# Patient Record
Sex: Male | Born: 1980 | Race: Black or African American | Hispanic: No | Marital: Single | Smoking: Never smoker
Health system: Southern US, Community
[De-identification: ages and names within clinical notes are randomized; demographics above are authoritative.]

## PROBLEM LIST (undated history)

## (undated) ENCOUNTER — Ambulatory Visit: Admission: EM | Payer: Self-pay | Source: Home / Self Care

## (undated) DIAGNOSIS — I10 Essential (primary) hypertension: Secondary | ICD-10-CM

## (undated) DIAGNOSIS — E119 Type 2 diabetes mellitus without complications: Secondary | ICD-10-CM

## (undated) HISTORY — PX: SHOULDER SURGERY: SHX246

---

## 2001-01-30 ENCOUNTER — Encounter: Payer: Self-pay | Admitting: *Deleted

## 2001-01-30 ENCOUNTER — Emergency Department (HOSPITAL_COMMUNITY): Admission: EM | Admit: 2001-01-30 | Discharge: 2001-01-30 | Payer: Self-pay | Admitting: *Deleted

## 2001-02-05 ENCOUNTER — Encounter: Admission: RE | Admit: 2001-02-05 | Discharge: 2001-05-06 | Payer: Self-pay | Admitting: Family Medicine

## 2001-11-28 ENCOUNTER — Emergency Department (HOSPITAL_COMMUNITY): Admission: EM | Admit: 2001-11-28 | Discharge: 2001-11-28 | Payer: Self-pay | Admitting: *Deleted

## 2001-11-29 ENCOUNTER — Emergency Department (HOSPITAL_COMMUNITY): Admission: EM | Admit: 2001-11-29 | Discharge: 2001-11-29 | Payer: Self-pay | Admitting: Emergency Medicine

## 2003-08-24 ENCOUNTER — Emergency Department (HOSPITAL_COMMUNITY): Admission: EM | Admit: 2003-08-24 | Discharge: 2003-08-25 | Payer: Self-pay | Admitting: Emergency Medicine

## 2004-12-28 ENCOUNTER — Emergency Department (HOSPITAL_COMMUNITY): Admission: EM | Admit: 2004-12-28 | Discharge: 2004-12-28 | Payer: Self-pay | Admitting: Emergency Medicine

## 2005-09-16 ENCOUNTER — Emergency Department (HOSPITAL_COMMUNITY): Admission: EM | Admit: 2005-09-16 | Discharge: 2005-09-16 | Payer: Self-pay | Admitting: Emergency Medicine

## 2008-07-12 ENCOUNTER — Emergency Department (HOSPITAL_COMMUNITY): Admission: EM | Admit: 2008-07-12 | Discharge: 2008-07-12 | Payer: Self-pay | Admitting: Emergency Medicine

## 2009-11-15 ENCOUNTER — Emergency Department (HOSPITAL_COMMUNITY): Admission: EM | Admit: 2009-11-15 | Discharge: 2009-11-15 | Payer: Self-pay | Admitting: Emergency Medicine

## 2010-06-07 LAB — GC/CHLAMYDIA PROBE AMP, GENITAL
Chlamydia, DNA Probe: NEGATIVE
GC Probe Amp, Genital: NEGATIVE

## 2015-12-22 ENCOUNTER — Emergency Department (HOSPITAL_COMMUNITY)
Admission: EM | Admit: 2015-12-22 | Discharge: 2015-12-23 | Disposition: A | Discharge status: Home / Self Care | Payer: No Typology Code available for payment source | Attending: Emergency Medicine | Admitting: Emergency Medicine

## 2015-12-22 ENCOUNTER — Encounter (HOSPITAL_COMMUNITY): Payer: Self-pay | Admitting: *Deleted

## 2015-12-22 DIAGNOSIS — R0781 Pleurodynia: Secondary | ICD-10-CM | POA: Diagnosis not present

## 2015-12-22 DIAGNOSIS — Y999 Unspecified external cause status: Secondary | ICD-10-CM | POA: Diagnosis not present

## 2015-12-22 DIAGNOSIS — M25562 Pain in left knee: Secondary | ICD-10-CM | POA: Insufficient documentation

## 2015-12-22 DIAGNOSIS — Y9389 Activity, other specified: Secondary | ICD-10-CM | POA: Insufficient documentation

## 2015-12-22 DIAGNOSIS — Y92410 Unspecified street and highway as the place of occurrence of the external cause: Secondary | ICD-10-CM | POA: Insufficient documentation

## 2015-12-22 MED ORDER — IBUPROFEN 800 MG PO TABS
800.0000 mg | ORAL_TABLET | Freq: Once | ORAL | Status: AC
  Administered 2015-12-23: 800 mg via ORAL
  Filled 2015-12-22: qty 1
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
## 2015-12-22 NOTE — Discharge Instructions (Signed)
Return if any problems.

## 2015-12-24 ENCOUNTER — Emergency Department (HOSPITAL_COMMUNITY)
Admission: EM | Admit: 2015-12-24 | Discharge: 2015-12-24 | Disposition: A | Discharge status: Home / Self Care | Payer: No Typology Code available for payment source | Attending: Emergency Medicine | Admitting: Emergency Medicine

## 2015-12-24 ENCOUNTER — Encounter (HOSPITAL_COMMUNITY): Payer: Self-pay

## 2015-12-24 DIAGNOSIS — R739 Hyperglycemia, unspecified: Secondary | ICD-10-CM | POA: Diagnosis not present

## 2015-12-24 DIAGNOSIS — S3992XD Unspecified injury of lower back, subsequent encounter: Secondary | ICD-10-CM | POA: Diagnosis present

## 2015-12-24 DIAGNOSIS — S39012D Strain of muscle, fascia and tendon of lower back, subsequent encounter: Secondary | ICD-10-CM | POA: Insufficient documentation

## 2015-12-24 HISTORY — DX: Type 2 diabetes mellitus without complications: E11.9

## 2015-12-24 LAB — URINE MICROSCOPIC-ADD ON
Bacteria, UA: NONE SEEN
SQUAMOUS EPITHELIAL / LPF: NONE SEEN
WBC, UA: NONE SEEN WBC/hpf (ref 0–5)

## 2015-12-24 LAB — CBC WITH DIFFERENTIAL/PLATELET
Basophils Absolute: 0 10*3/uL (ref 0.0–0.1)
Basophils Relative: 0 %
EOS PCT: 1 %
Eosinophils Absolute: 0.1 10*3/uL (ref 0.0–0.7)
HCT: 44.5 % (ref 39.0–52.0)
Hemoglobin: 14.9 g/dL (ref 13.0–17.0)
LYMPHS ABS: 1.7 10*3/uL (ref 0.7–4.0)
LYMPHS PCT: 30 %
MCH: 26.8 pg (ref 26.0–34.0)
MCHC: 33.5 g/dL (ref 30.0–36.0)
MCV: 79.9 fL (ref 78.0–100.0)
Monocytes Absolute: 0.3 10*3/uL (ref 0.1–1.0)
Monocytes Relative: 5 %
Neutro Abs: 3.5 10*3/uL (ref 1.7–7.7)
Neutrophils Relative %: 64 %
PLATELETS: 168 10*3/uL (ref 150–400)
RBC: 5.57 MIL/uL (ref 4.22–5.81)
RDW: 12.4 % (ref 11.5–15.5)
WBC: 5.6 10*3/uL (ref 4.0–10.5)

## 2015-12-24 LAB — BASIC METABOLIC PANEL
Anion gap: 5 (ref 5–15)
BUN: 17 mg/dL (ref 6–20)
CHLORIDE: 99 mmol/L — AB (ref 101–111)
CO2: 30 mmol/L (ref 22–32)
Calcium: 9.2 mg/dL (ref 8.9–10.3)
Creatinine, Ser: 0.8 mg/dL (ref 0.61–1.24)
GFR calc Af Amer: >60 mL/min (ref >60)
GLUCOSE: 293 mg/dL — AB (ref 65–99)
POTASSIUM: 4.4 mmol/L (ref 3.5–5.1)
Sodium: 134 mmol/L — ABNORMAL LOW (ref 135–145)

## 2015-12-24 LAB — URINALYSIS, ROUTINE W REFLEX MICROSCOPIC
Bilirubin Urine: NEGATIVE
Glucose, UA: >1000 mg/dL — AB
Ketones, ur: NEGATIVE mg/dL
Leukocytes, UA: NEGATIVE
Nitrite: NEGATIVE
Protein, ur: NEGATIVE mg/dL
Specific Gravity, Urine: 1.01 (ref 1.005–1.030)
pH: 6 (ref 5.0–8.0)

## 2015-12-24 MED ORDER — METFORMIN HCL 500 MG PO TABS: 500.0000 mg | ORAL_TABLET | Freq: Two times a day (BID) | ORAL | 0 refills | Status: DC

## 2015-12-24 MED ORDER — CYCLOBENZAPRINE HCL 10 MG PO TABS
10.0000 mg | ORAL_TABLET | Freq: Once | ORAL | Status: AC
  Administered 2015-12-24: 10 mg via ORAL
  Filled 2015-12-24: qty 1

## 2015-12-24 MED ORDER — IBUPROFEN 800 MG PO TABS
800.0000 mg | ORAL_TABLET | Freq: Once | ORAL | Status: AC
  Administered 2015-12-24: 800 mg via ORAL
  Filled 2015-12-24: qty 1

## 2015-12-24 MED ORDER — CYCLOBENZAPRINE HCL 10 MG PO TABS: 10.0000 mg | ORAL_TABLET | Freq: Three times a day (TID) | ORAL | 0 refills | Status: DC | PRN

## 2015-12-24 MED ORDER — IBUPROFEN 800 MG PO TABS: 800.0000 mg | ORAL_TABLET | Freq: Three times a day (TID) | ORAL | 0 refills | Status: DC
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
## 2015-12-24 NOTE — Discharge Instructions (Signed)
Apply ice packs on and off to the affected area. It's important that you control your blood sugars and monitor them closely. Call one of the clinics listed above to arrange a follow-up appointment and establish primary care

## 2016-01-07 ENCOUNTER — Encounter (HOSPITAL_COMMUNITY): Payer: Self-pay | Admitting: Emergency Medicine

## 2016-01-07 ENCOUNTER — Emergency Department (HOSPITAL_COMMUNITY): Payer: No Typology Code available for payment source

## 2016-01-07 ENCOUNTER — Emergency Department (HOSPITAL_COMMUNITY)
Admission: EM | Admit: 2016-01-07 | Discharge: 2016-01-07 | Disposition: A | Discharge status: Home / Self Care | Payer: No Typology Code available for payment source | Attending: Emergency Medicine | Admitting: Emergency Medicine

## 2016-01-07 DIAGNOSIS — Z7984 Long term (current) use of oral hypoglycemic drugs: Secondary | ICD-10-CM | POA: Insufficient documentation

## 2016-01-07 DIAGNOSIS — R739 Hyperglycemia, unspecified: Secondary | ICD-10-CM

## 2016-01-07 DIAGNOSIS — Z791 Long term (current) use of non-steroidal anti-inflammatories (NSAID): Secondary | ICD-10-CM | POA: Diagnosis not present

## 2016-01-07 DIAGNOSIS — Y92410 Unspecified street and highway as the place of occurrence of the external cause: Secondary | ICD-10-CM | POA: Insufficient documentation

## 2016-01-07 DIAGNOSIS — E1165 Type 2 diabetes mellitus with hyperglycemia: Secondary | ICD-10-CM | POA: Diagnosis not present

## 2016-01-07 DIAGNOSIS — Y999 Unspecified external cause status: Secondary | ICD-10-CM | POA: Insufficient documentation

## 2016-01-07 DIAGNOSIS — S39012A Strain of muscle, fascia and tendon of lower back, initial encounter: Secondary | ICD-10-CM | POA: Diagnosis not present

## 2016-01-07 DIAGNOSIS — S3992XA Unspecified injury of lower back, initial encounter: Secondary | ICD-10-CM | POA: Diagnosis present

## 2016-01-07 DIAGNOSIS — I10 Essential (primary) hypertension: Secondary | ICD-10-CM | POA: Insufficient documentation

## 2016-01-07 DIAGNOSIS — Y939 Activity, unspecified: Secondary | ICD-10-CM | POA: Diagnosis not present

## 2016-01-07 HISTORY — DX: Essential (primary) hypertension: I10

## 2016-01-07 LAB — BASIC METABOLIC PANEL
Anion gap: 5 (ref 5–15)
BUN: 11 mg/dL (ref 6–20)
CHLORIDE: 97 mmol/L — AB (ref 101–111)
CO2: 30 mmol/L (ref 22–32)
Calcium: 9 mg/dL (ref 8.9–10.3)
Creatinine, Ser: 0.82 mg/dL (ref 0.61–1.24)
GFR calc Af Amer: >60 mL/min (ref >60)
GFR calc non Af Amer: >60 mL/min (ref >60)
GLUCOSE: 279 mg/dL — AB (ref 65–99)
POTASSIUM: 4.2 mmol/L (ref 3.5–5.1)
Sodium: 132 mmol/L — ABNORMAL LOW (ref 135–145)

## 2016-01-07 LAB — CBG MONITORING, ED: Glucose-Capillary: 283 mg/dL — ABNORMAL HIGH (ref 65–99)

## 2016-01-07 MED ORDER — HYDROCODONE-ACETAMINOPHEN 5-325 MG PO TABS: 1.0000 | ORAL_TABLET | ORAL | 0 refills | Status: DC | PRN

## 2016-01-07 MED ORDER — MELOXICAM 15 MG PO TABS: 15.0000 mg | ORAL_TABLET | Freq: Every day | ORAL | 0 refills | Status: DC
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
## 2016-01-07 NOTE — Discharge Instructions (Signed)
Make sure you're taking the metformin you were prescribed at your last visit twice daily.  Take the medications prescribed today, using caution with hydrocodone as this medication will make you sleepy, do not drive within 4 hours of taking this medicine.  I do encourage you to get the Flexeril which is a muscle relaxer filled which were prescribed at her last visit here also.
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
## 2016-04-01 ENCOUNTER — Emergency Department (HOSPITAL_COMMUNITY)
Admission: EM | Admit: 2016-04-01 | Discharge: 2016-04-01 | Disposition: A | Discharge status: Home / Self Care | Payer: Self-pay | Attending: Emergency Medicine | Admitting: Emergency Medicine

## 2016-04-01 ENCOUNTER — Encounter (HOSPITAL_COMMUNITY): Payer: Self-pay | Admitting: Emergency Medicine

## 2016-04-01 DIAGNOSIS — Z791 Long term (current) use of non-steroidal anti-inflammatories (NSAID): Secondary | ICD-10-CM | POA: Insufficient documentation

## 2016-04-01 DIAGNOSIS — I10 Essential (primary) hypertension: Secondary | ICD-10-CM | POA: Insufficient documentation

## 2016-04-01 DIAGNOSIS — R112 Nausea with vomiting, unspecified: Secondary | ICD-10-CM | POA: Insufficient documentation

## 2016-04-01 DIAGNOSIS — E119 Type 2 diabetes mellitus without complications: Secondary | ICD-10-CM | POA: Insufficient documentation

## 2016-04-01 LAB — CBC WITH DIFFERENTIAL/PLATELET
Basophils Absolute: 0 10*3/uL (ref 0.0–0.1)
Basophils Relative: 0 %
Eosinophils Absolute: 0.1 10*3/uL (ref 0.0–0.7)
Eosinophils Relative: 1 %
HEMATOCRIT: 40.9 % (ref 39.0–52.0)
HEMOGLOBIN: 13.8 g/dL (ref 13.0–17.0)
LYMPHS PCT: 26 %
Lymphs Abs: 1.1 10*3/uL (ref 0.7–4.0)
MCH: 26.5 pg (ref 26.0–34.0)
MCHC: 33.7 g/dL (ref 30.0–36.0)
MCV: 78.7 fL (ref 78.0–100.0)
MONO ABS: 0.2 10*3/uL (ref 0.1–1.0)
MONOS PCT: 6 %
NEUTROS ABS: 2.8 10*3/uL (ref 1.7–7.7)
NEUTROS PCT: 67 %
Platelets: 50 10*3/uL — ABNORMAL LOW (ref 150–400)
RBC: 5.2 MIL/uL (ref 4.22–5.81)
RDW: 12.4 % (ref 11.5–15.5)
WBC: 4.2 10*3/uL (ref 4.0–10.5)

## 2016-04-01 LAB — COMPREHENSIVE METABOLIC PANEL
ALBUMIN: 4 g/dL (ref 3.5–5.0)
ALT: 31 U/L (ref 17–63)
ANION GAP: 5 (ref 5–15)
AST: 41 U/L (ref 15–41)
Alkaline Phosphatase: 69 U/L (ref 38–126)
BUN: 13 mg/dL (ref 6–20)
CO2: 27 mmol/L (ref 22–32)
Calcium: 9.2 mg/dL (ref 8.9–10.3)
Chloride: 101 mmol/L (ref 101–111)
Creatinine, Ser: 0.88 mg/dL (ref 0.61–1.24)
GFR calc non Af Amer: >60 mL/min (ref >60)
GLUCOSE: 310 mg/dL — AB (ref 65–99)
POTASSIUM: 5.4 mmol/L — AB (ref 3.5–5.1)
SODIUM: 133 mmol/L — AB (ref 135–145)
TOTAL PROTEIN: 7.4 g/dL (ref 6.5–8.1)
Total Bilirubin: 1.1 mg/dL (ref 0.3–1.2)

## 2016-04-01 MED ORDER — PROMETHAZINE HCL 25 MG PO TABS: 25.0000 mg | ORAL_TABLET | Freq: Four times a day (QID) | ORAL | 0 refills | Status: DC | PRN

## 2016-04-01 MED ORDER — METFORMIN HCL 500 MG PO TABS: 500.0000 mg | ORAL_TABLET | Freq: Two times a day (BID) | ORAL | 1 refills | Status: DC

## 2016-04-01 NOTE — Discharge Instructions (Signed)
Increase fluids. Refill of your diabetes medicine. Prescription for nausea medicine. You need to find a primary care doctor.

## 2016-04-01 NOTE — ED Provider Notes (Signed)
[facility] Provider Note   CSN: [id number] Arrival date & time: 04/01/16  1038  By signing my name below, I, Majel Homer, attest that this documentation has been prepared under the direction and in the presence of Donnetta Hutching, MD . Electronically Signed: Majel Homer, Scribe. 04/01/2016. 11:24 AM.  History   Chief Complaint Chief Complaint  Patient presents with  . Emesis   The history is provided by the patient. No language interpreter was used.   HPI Comments: Mike Bowen is a 36 y.o. male with PMHx of DM and HTN, who presents to the Emergency Department for an evaluation of multiple episodes of nausea and vomiting that began at ~3:00 AM yesterday morning. Pt reports his symptoms are gradually improving as he was able to tolerate food and drink liquids without any vomiting this morning. He notes he has not checked his sugar today but states it "has been running high." Pt reports he recently ran out of his metformin and is "waiting for his new insurance to kick in from his new job" before refilling his prescription. He denies any abdominal pain.   Past Medical History:  Diagnosis Date  . Diabetes mellitus without complication (HCC)   . Hypertension    There are no active problems to display for this patient.  Past Surgical History:  Procedure Laterality Date  . SHOULDER SURGERY      Home Medications    Prior to Admission medications   Medication Sig Start Date End Date Taking? Authorizing Provider  ibuprofen (ADVIL,MOTRIN) 800 MG tablet Take 1 tablet (800 mg total) by mouth 3 (three) times daily. 12/24/15  Yes Tammy Triplett, PA-C  cyclobenzaprine (FLEXERIL) 10 MG tablet Take 1 tablet (10 mg total) by mouth 3 (three) times daily as needed. Patient not taking: Reported on 04/01/2016 12/24/15   Tammy Triplett, PA-C  HYDROcodone-acetaminophen (NORCO/VICODIN) 5-325 MG tablet Take 1 tablet by mouth every 4 (four) hours as needed. Patient not taking: Reported on 04/01/2016 01/07/16    Burgess Amor, PA-C  metFORMIN (GLUCOPHAGE) 500 MG tablet Take 1 tablet (500 mg total) by mouth 2 (two) times daily with a meal. 04/01/16   Donnetta Hutching, MD  promethazine (PHENERGAN) 25 MG tablet Take 1 tablet (25 mg total) by mouth every 6 (six) hours as needed. 04/01/16   Donnetta Hutching, MD    Family History Family History  Problem Relation Age of Onset  . Diabetes Other     Social History Social History  Substance Use Topics  . Smoking status: Never Smoker  . Smokeless tobacco: Never Used  . Alcohol use No   Allergies   Patient has no known allergies.  Review of Systems Review of Systems  Constitutional: Negative for fever.  Gastrointestinal: Positive for nausea and vomiting. Negative for abdominal pain.  All other systems reviewed and are negative.  Physical Exam Updated Vital Signs BP (!) 164/116 (BP Location: Right Arm)   Pulse 83   Temp 97.9 F (36.6 C) (Oral)   Resp 18   Ht 6' (1.829 m)   Wt 225 lb (102.1 kg)   SpO2 100%   BMI 30.52 kg/m   Physical Exam  Constitutional: He is oriented to person, place, and time. He appears well-developed and well-nourished.  HENT:  Head: Normocephalic and atraumatic.  Eyes: Conjunctivae are normal.  Neck: Neck supple.  Cardiovascular: Normal rate and regular rhythm.   Pulmonary/Chest: Effort normal and breath sounds normal.  Abdominal: Soft. Bowel sounds are normal.  Musculoskeletal: Normal range of  motion.  Neurological: He is alert and oriented to person, place, and time.  Skin: Skin is warm and dry.  Psychiatric: He has a normal mood and affect. His behavior is normal.  Nursing note and vitals reviewed.  ED Treatments / Results  DIAGNOSTIC STUDIES:  Oxygen Saturation is 100% on RA, normal by my interpretation.    COORDINATION OF CARE:  11:15 AM Discussed treatment plan with pt at bedside and pt agreed to plan. Plan to check glucose levels and prescribe pt metformin.   Labs (all labs ordered are listed, but only  abnormal results are displayed) Labs Reviewed  COMPREHENSIVE METABOLIC PANEL - Abnormal; Notable for the following:       Result Value   Sodium 133 (*)    Potassium 5.4 (*)    Glucose, Bld 310 (*)    All other components within normal limits  CBC WITH DIFFERENTIAL/PLATELET - Abnormal; Notable for the following:    Platelets 50 (*)    All other components within normal limits    EKG  EKG Interpretation None       Radiology No results found.  Procedures Procedures (including critical care time)  Medications Ordered in ED Medications - No data to display  Initial Impression / Assessment and Plan / ED Course  I have reviewed the triage vital signs and the nursing notes.  Pertinent labs & imaging results that were available during my care of the patient were reviewed by me and considered in my medical decision making (see chart for details).    patient is nontoxic-appearing.  Glucose is 310, but patient is not in DKA. He has not been taking his diabetes medicine. Discharge medications metformin 500 mg and Phenergan 25 mg  I personally performed the services described in this documentation, which was scribed in my presence. The recorded information has been reviewed and is accurate.    Final Clinical Impressions(s) / ED Diagnoses   Final diagnoses:  Intractable vomiting with nausea, unspecified vomiting type    New Prescriptions New Prescriptions   METFORMIN (GLUCOPHAGE) 500 MG TABLET    Take 1 tablet (500 mg total) by mouth 2 (two) times daily with a meal.   PROMETHAZINE (PHENERGAN) 25 MG TABLET    Take 1 tablet (25 mg total) by mouth every 6 (six) hours as needed.     Donnetta HutchingBrian Roselle Norton, MD 04/01/16 [phone]

## 2016-04-01 NOTE — ED Triage Notes (Signed)
Pt reports n/v/d x2 days, denies abd pain.  Pt alert and oriented, pt daughter recently had stomach virus.

## 2016-06-08 ENCOUNTER — Emergency Department (HOSPITAL_COMMUNITY)
Admission: EM | Admit: 2016-06-08 | Discharge: 2016-06-08 | Disposition: A | Discharge status: Home / Self Care | Payer: Self-pay | Attending: Emergency Medicine | Admitting: Emergency Medicine

## 2016-06-08 ENCOUNTER — Encounter (HOSPITAL_COMMUNITY): Payer: Self-pay

## 2016-06-08 DIAGNOSIS — Z79899 Other long term (current) drug therapy: Secondary | ICD-10-CM | POA: Insufficient documentation

## 2016-06-08 DIAGNOSIS — E119 Type 2 diabetes mellitus without complications: Secondary | ICD-10-CM | POA: Insufficient documentation

## 2016-06-08 DIAGNOSIS — M25642 Stiffness of left hand, not elsewhere classified: Secondary | ICD-10-CM | POA: Insufficient documentation

## 2016-06-08 DIAGNOSIS — M25641 Stiffness of right hand, not elsewhere classified: Secondary | ICD-10-CM | POA: Insufficient documentation

## 2016-06-08 DIAGNOSIS — I10 Essential (primary) hypertension: Secondary | ICD-10-CM | POA: Insufficient documentation

## 2016-06-08 DIAGNOSIS — R51 Headache: Secondary | ICD-10-CM | POA: Insufficient documentation

## 2016-06-08 DIAGNOSIS — Z7984 Long term (current) use of oral hypoglycemic drugs: Secondary | ICD-10-CM | POA: Insufficient documentation

## 2016-06-08 NOTE — ED Provider Notes (Signed)
[facility] DEPT Provider Note   CSN: [id number] Arrival date & time: 06/08/16  0428     History   Chief Complaint Chief Complaint  Patient presents with  . Hand Pain    HPI Mike Bowen is a 36 y.o. male.  HPI Patient presents for work note. States he developed a migraine headache around midnight and had to leave work. He went home and took a nap. When he woke up the headache was gone. No weakness or numbness. No visual changes.  He also states he had some difficulty fully flexing the middle fingers of both hands when he woke up. Denies numbness or injury. Denies any pain. No swelling or redness. Past Medical History:  Diagnosis Date  . Diabetes mellitus without complication (HCC)   . Hypertension     There are no active problems to display for this patient.   Past Surgical History:  Procedure Laterality Date  . SHOULDER SURGERY         Home Medications    Prior to Admission medications   Medication Sig Start Date End Date Taking? Authorizing Provider  metFORMIN (GLUCOPHAGE) 500 MG tablet Take 1 tablet (500 mg total) by mouth 2 (two) times daily with a meal. 04/01/16  Yes Donnetta Hutching, MD  cyclobenzaprine (FLEXERIL) 10 MG tablet Take 1 tablet (10 mg total) by mouth 3 (three) times daily as needed. Patient not taking: Reported on 04/01/2016 12/24/15   Tammy Triplett, PA-C  HYDROcodone-acetaminophen (NORCO/VICODIN) 5-325 MG tablet Take 1 tablet by mouth every 4 (four) hours as needed. Patient not taking: Reported on 04/01/2016 01/07/16   Burgess Amor, PA-C  ibuprofen (ADVIL,MOTRIN) 800 MG tablet Take 1 tablet (800 mg total) by mouth 3 (three) times daily. 12/24/15   Tammy Triplett, PA-C  promethazine (PHENERGAN) 25 MG tablet Take 1 tablet (25 mg total) by mouth every 6 (six) hours as needed. 04/01/16   Donnetta Hutching, MD    Family History Family History  Problem Relation Age of Onset  . Diabetes Other     Social History Social History  Substance Use Topics  . Smoking  status: Never Smoker  . Smokeless tobacco: Never Used  . Alcohol use No     Allergies   Patient has no known allergies.   Review of Systems Review of Systems  Constitutional: Negative for chills and fever.  Eyes: Negative for photophobia and visual disturbance.  Gastrointestinal: Negative for nausea and vomiting.  Musculoskeletal: Negative for arthralgias and myalgias.  Skin: Negative for rash and wound.  Neurological: Positive for headaches. Negative for weakness and numbness.  All other systems reviewed and are negative.    Physical Exam Updated Vital Signs BP (!) 159/102   Pulse 70   Temp 97.7 F (36.5 C)   Resp 18   Ht 6' (1.829 m)   Wt 225 lb (102.1 kg)   SpO2 99%   BMI 30.52 kg/m   Physical Exam  Constitutional: He is oriented to person, place, and time. He appears well-developed and well-nourished.  HENT:  Head: Normocephalic and atraumatic.  Mouth/Throat: Oropharynx is clear and moist.  Eyes: EOM are normal. Pupils are equal, round, and reactive to light.  Neck: Normal range of motion. Neck supple.  Cardiovascular: Normal rate and regular rhythm.   Pulmonary/Chest: Effort normal and breath sounds normal.  Abdominal: Soft. Bowel sounds are normal.  Musculoskeletal: Normal range of motion. He exhibits no edema or tenderness.  Patient will not fully flex the right middle finger on command. No  palpated masses in the flexor tendon. No swelling, erythema or evidence of injury. Patient is witnessed fully flexing the same finger when not specifically asked to. Patient has full range of motion of the middle finger of the left hand. No flexor tendon masses appreciated. No obvious injury, redness, swelling or warmth demonstrated. Good distal cap refill.  Neurological: He is alert and oriented to person, place, and time.  5/5 motor in all extremities. Sensation fully intact. Ambulating without difficulty  Skin: Skin is warm and dry. Capillary refill takes less than 2  seconds. No rash noted. No erythema.  Psychiatric: He has a normal mood and affect. His behavior is normal.  Nursing note and vitals reviewed.    ED Treatments / Results  Labs (all labs ordered are listed, but only abnormal results are displayed) Labs Reviewed - No data to display  EKG  EKG Interpretation None       Radiology No results found.  Procedures Procedures (including critical care time)  Medications Ordered in ED Medications - No data to display   Initial Impression / Assessment and Plan / ED Course  I have reviewed the triage vital signs and the nursing notes.  Pertinent labs & imaging results that were available during my care of the patient were reviewed by me and considered in my medical decision making (see chart for details).    No headache and a normal neurologic exam. Low suspicion for acute injury or dysfunction of the patient's hands. Patient has inconsistent exam. Advised to follow-up with hand surgery if symptoms persist.  Final Clinical Impressions(s) / ED Diagnoses   Final diagnoses:  Finger stiffness, left  Finger stiffness, right    New Prescriptions New Prescriptions   No medications on file     Loren Racer, MD 06/08/16 0501

## 2016-06-08 NOTE — ED Triage Notes (Signed)
Pt initially states he is here because he had to leave work last night for a migraine.  Pt states he does not have a headache at all now, but needs a work note.  Pt states he is also having pain to the middle finger of his right hand, and denies injury

## 2016-06-24 ENCOUNTER — Emergency Department (HOSPITAL_COMMUNITY): Payer: Self-pay

## 2016-06-24 ENCOUNTER — Encounter (HOSPITAL_COMMUNITY): Payer: Self-pay

## 2016-06-24 DIAGNOSIS — E1165 Type 2 diabetes mellitus with hyperglycemia: Secondary | ICD-10-CM | POA: Insufficient documentation

## 2016-06-24 DIAGNOSIS — Z5321 Procedure and treatment not carried out due to patient leaving prior to being seen by health care provider: Secondary | ICD-10-CM | POA: Insufficient documentation

## 2016-06-24 LAB — BASIC METABOLIC PANEL
ANION GAP: 8 (ref 5–15)
BUN: 11 mg/dL (ref 6–20)
CHLORIDE: 97 mmol/L — AB (ref 101–111)
CO2: 26 mmol/L (ref 22–32)
CREATININE: 0.88 mg/dL (ref 0.61–1.24)
Calcium: 8.9 mg/dL (ref 8.9–10.3)
GFR calc non Af Amer: >60 mL/min (ref >60)
Glucose, Bld: 345 mg/dL — ABNORMAL HIGH (ref 65–99)
Potassium: 3.9 mmol/L (ref 3.5–5.1)
SODIUM: 131 mmol/L — AB (ref 135–145)

## 2016-06-24 LAB — CBC
HEMATOCRIT: 41.2 % (ref 39.0–52.0)
HEMOGLOBIN: 14 g/dL (ref 13.0–17.0)
MCH: 26.3 pg (ref 26.0–34.0)
MCHC: 34 g/dL (ref 30.0–36.0)
MCV: 77.3 fL — AB (ref 78.0–100.0)
Platelets: 157 10*3/uL (ref 150–400)
RBC: 5.33 MIL/uL (ref 4.22–5.81)
RDW: 12.5 % (ref 11.5–15.5)
WBC: 7.6 10*3/uL (ref 4.0–10.5)

## 2016-06-24 LAB — URINALYSIS, ROUTINE W REFLEX MICROSCOPIC
BILIRUBIN URINE: NEGATIVE
Glucose, UA: >=500 mg/dL — AB
KETONES UR: NEGATIVE mg/dL
LEUKOCYTES UA: NEGATIVE
NITRITE: NEGATIVE
PROTEIN: 100 mg/dL — AB
SQUAMOUS EPITHELIAL / LPF: NONE SEEN
Specific Gravity, Urine: 1.014 (ref 1.005–1.030)
pH: 6 (ref 5.0–8.0)

## 2016-06-24 LAB — CBG MONITORING, ED: GLUCOSE-CAPILLARY: 314 mg/dL — AB (ref 65–99)

## 2016-06-24 NOTE — ED Triage Notes (Signed)
Pt states he had DM and ran out of Metformin and had not had med in 2 days; pt c/o onset of SOB today and feeling weird; pt has BS of 314 at triage; pt a&ox 4 on arrival. Pt denies pain; pt states he did not take BS at all today but normally checks daily.

## 2016-06-24 NOTE — ED Notes (Signed)
Checked CBG 314, RN Monique informed

## 2016-06-25 ENCOUNTER — Emergency Department (HOSPITAL_COMMUNITY)
Admission: EM | Admit: 2016-06-25 | Discharge: 2016-06-25 | Disposition: A | Discharge status: Home / Self Care | Payer: Self-pay | Attending: Emergency Medicine | Admitting: Emergency Medicine

## 2016-06-25 ENCOUNTER — Emergency Department (HOSPITAL_COMMUNITY): Payer: Self-pay

## 2016-06-25 ENCOUNTER — Encounter (HOSPITAL_COMMUNITY): Payer: Self-pay | Admitting: Emergency Medicine

## 2016-06-25 DIAGNOSIS — Z7984 Long term (current) use of oral hypoglycemic drugs: Secondary | ICD-10-CM | POA: Insufficient documentation

## 2016-06-25 DIAGNOSIS — E1165 Type 2 diabetes mellitus with hyperglycemia: Secondary | ICD-10-CM | POA: Insufficient documentation

## 2016-06-25 DIAGNOSIS — I10 Essential (primary) hypertension: Secondary | ICD-10-CM | POA: Insufficient documentation

## 2016-06-25 DIAGNOSIS — E119 Type 2 diabetes mellitus without complications: Secondary | ICD-10-CM

## 2016-06-25 DIAGNOSIS — R739 Hyperglycemia, unspecified: Secondary | ICD-10-CM

## 2016-06-25 LAB — I-STAT TROPONIN, ED: Troponin i, poc: 0 ng/mL (ref 0.00–0.08)

## 2016-06-25 LAB — BASIC METABOLIC PANEL
Anion gap: 6 (ref 5–15)
BUN: 13 mg/dL (ref 6–20)
CHLORIDE: 96 mmol/L — AB (ref 101–111)
CO2: 30 mmol/L (ref 22–32)
Calcium: 9 mg/dL (ref 8.9–10.3)
Creatinine, Ser: 0.93 mg/dL (ref 0.61–1.24)
GFR calc Af Amer: >60 mL/min (ref >60)
GLUCOSE: 418 mg/dL — AB (ref 65–99)
POTASSIUM: 4.5 mmol/L (ref 3.5–5.1)
Sodium: 132 mmol/L — ABNORMAL LOW (ref 135–145)

## 2016-06-25 LAB — CBC
HEMATOCRIT: 43.8 % (ref 39.0–52.0)
Hemoglobin: 14.8 g/dL (ref 13.0–17.0)
MCH: 26.2 pg (ref 26.0–34.0)
MCHC: 33.8 g/dL (ref 30.0–36.0)
MCV: 77.5 fL — AB (ref 78.0–100.0)
Platelets: 167 10*3/uL (ref 150–400)
RBC: 5.65 MIL/uL (ref 4.22–5.81)
RDW: 12.5 % (ref 11.5–15.5)
WBC: 5.9 10*3/uL (ref 4.0–10.5)

## 2016-06-25 LAB — CBG MONITORING, ED
Glucose-Capillary: 378 mg/dL — ABNORMAL HIGH (ref 65–99)
Glucose-Capillary: 189 mg/dL — ABNORMAL HIGH (ref 65–99)

## 2016-06-25 MED ORDER — INSULIN ASPART 100 UNIT/ML ~~LOC~~ SOLN
5.0000 [IU] | Freq: Once | SUBCUTANEOUS | Status: AC
  Administered 2016-06-25: 5 [IU] via SUBCUTANEOUS
  Filled 2016-06-25: qty 1

## 2016-06-25 MED ORDER — METFORMIN HCL 500 MG PO TABS: 500.0000 mg | ORAL_TABLET | Freq: Two times a day (BID) | ORAL | 0 refills | Status: DC

## 2016-06-25 MED ORDER — SODIUM CHLORIDE 0.9 % IV BOLUS (SEPSIS)
1000.0000 mL | Freq: Once | INTRAVENOUS | Status: AC
  Administered 2016-06-25: 1000 mL via INTRAVENOUS

## 2016-06-25 NOTE — ED Notes (Signed)
Pt returned to room from XR reporting he was seen yesterday and had a XR done however he had to leave to check on his grandmother so he did not get any results or treatment.

## 2016-06-25 NOTE — ED Provider Notes (Signed)
[facility]-EMERGENCY DEPT Provider Note   CSN: [id number] Arrival date & time: 06/25/16  1514     History   Chief Complaint Chief Complaint  Patient presents with  . Shortness of Breath  . Medication Refill    HPI Mike Bowen is a 36 y.o. male.  HPI   36 yo M with h/o HTN, DM here with generalized weakness and SOB x 3-5 days. Pt states that he recently ran out of his metformin some time int he last 1-2 months. Over the past week or so, he has developed generalized fatigue as well as mild SOB with exertion but also at rest. He feels tired and has noticed increasing appetite over the same time period. He has a h/o high sugars with similar sx in the past. Denies any polyuria or polydipsia however. No fever, chills. No chest pain associated with his SOB and he describes his SOB more so as mild fatigue. No vomiting or diarrhea. He does not have a PCP at this time as he had to change insurers, but is reportedly scheduled to receive his insurance card soon. No other complaints. He was seen initially yesterday in fast track and had a normal CXR, had to leave to take care of his family.  Past Medical History:  Diagnosis Date  . Diabetes mellitus without complication (HCC)   . Hypertension     There are no active problems to display for this patient.   Past Surgical History:  Procedure Laterality Date  . SHOULDER SURGERY         Home Medications    Prior to Admission medications   Medication Sig Start Date End Date Taking? Authorizing Provider  cyclobenzaprine (FLEXERIL) 10 MG tablet Take 1 tablet (10 mg total) by mouth 3 (three) times daily as needed. Patient not taking: Reported on 04/01/2016 12/24/15   Tammy Triplett, PA-C  HYDROcodone-acetaminophen (NORCO/VICODIN) 5-325 MG tablet Take 1 tablet by mouth every 4 (four) hours as needed. Patient not taking: Reported on 04/01/2016 01/07/16   Burgess Amor, PA-C  ibuprofen (ADVIL,MOTRIN) 800 MG tablet Take 1 tablet (800 mg total) by mouth 3  (three) times daily. 12/24/15   Tammy Triplett, PA-C  metFORMIN (GLUCOPHAGE) 500 MG tablet Take 1 tablet (500 mg total) by mouth 2 (two) times daily with a meal. 06/25/16 07/25/16  Shaune Pollack, MD  promethazine (PHENERGAN) 25 MG tablet Take 1 tablet (25 mg total) by mouth every 6 (six) hours as needed. 04/01/16   Donnetta Hutching, MD    Family History Family History  Problem Relation Age of Onset  . Diabetes Other     Social History Social History  Substance Use Topics  . Smoking status: Never Smoker  . Smokeless tobacco: Never Used  . Alcohol use Yes     Allergies   Patient has no known allergies.   Review of Systems Review of Systems  Constitutional: Positive for fatigue. Negative for chills and fever.  HENT: Negative for congestion, ear pain, rhinorrhea and sore throat.   Eyes: Negative for pain and visual disturbance.  Respiratory: Positive for shortness of breath. Negative for cough and wheezing.   Cardiovascular: Negative for chest pain, palpitations and leg swelling.  Gastrointestinal: Negative for abdominal pain, diarrhea, nausea and vomiting.  Endocrine: Positive for polyphagia.  Genitourinary: Negative for dysuria, flank pain and hematuria.  Musculoskeletal: Negative for arthralgias, back pain, neck pain and neck stiffness.  Skin: Negative for color change, rash and wound.  Allergic/Immunologic: Negative for immunocompromised state.  Neurological: Positive  for weakness. Negative for seizures, syncope and headaches.  All other systems reviewed and are negative.    Physical Exam Updated Vital Signs BP 132/83   Pulse 78   Temp 98.4 F (36.9 C) (Oral)   Resp 17   Ht 6' (1.829 m)   Wt 225 lb (102.1 kg)   SpO2 98%   BMI 30.52 kg/m   Physical Exam  Constitutional: He is oriented to person, place, and time. He appears well-developed and well-nourished. No distress.  HENT:  Head: Normocephalic and atraumatic.  Mildly dry MM  Eyes: Conjunctivae are normal.  Neck:  Neck supple.  Cardiovascular: Normal rate and regular rhythm.  Exam reveals no friction rub.   No murmur heard. Pulmonary/Chest: Effort normal and breath sounds normal. No respiratory distress. He has no wheezes. He has no rales.  Abdominal: Soft. Bowel sounds are normal. He exhibits no distension.  Musculoskeletal: He exhibits no edema.  Neurological: He is alert and oriented to person, place, and time. He exhibits normal muscle tone.  Skin: Skin is warm. Capillary refill takes less than 2 seconds.  Psychiatric: He has a normal mood and affect.  Nursing note and vitals reviewed.    ED Treatments / Results  Labs (all labs ordered are listed, but only abnormal results are displayed) Labs Reviewed  BASIC METABOLIC PANEL - Abnormal; Notable for the following:       Result Value   Sodium 132 (*)    Chloride 96 (*)    Glucose, Bld 418 (*)    All other components within normal limits  CBC - Abnormal; Notable for the following:    MCV 77.5 (*)    All other components within normal limits  CBG MONITORING, ED - Abnormal; Notable for the following:    Glucose-Capillary 378 (*)    All other components within normal limits  CBG MONITORING, ED - Abnormal; Notable for the following:    Glucose-Capillary 189 (*)    All other components within normal limits  I-STAT TROPOININ, ED    EKG  EKG Interpretation  Date/Time:  Sunday June 25 2016 15:20:03 EDT Ventricular Rate:  85 PR Interval:  148 QRS Duration: 84 QT Interval:  350 QTC Calculation: 416 R Axis:   94 Text Interpretation:  Normal sinus rhythm Rightward axis Nonspecific T wave abnormality Abnormal ECG No significant change since last tracing Confirmed by Ziv Welchel MD, Sheria Lang [phone]) on 06/25/2016 4:15:38 PM       Radiology Dg Chest 2 View  Result Date: 06/24/2016 CLINICAL DATA:  Shortness of breath and hyperglycemia. History of hypertension and diabetes. EXAM: CHEST  2 VIEW COMPARISON:  Chest radiograph September 16, 2005 FINDINGS:  Cardiomediastinal silhouette is normal. No pleural effusions or focal consolidations. Trachea projects midline and there is no pneumothorax. Soft tissue planes and included osseous structures are non-suspicious. IMPRESSION: Normal chest. Electronically Signed   By: Awilda Metro M.D.   On: 06/24/2016 23:33    Procedures Procedures (including critical care time)  Medications Ordered in ED Medications  sodium chloride 0.9 % bolus 1,000 mL (0 mLs Intravenous Stopped 06/25/16 1917)  insulin aspart (novoLOG) injection 5 Units (5 Units Subcutaneous Given 06/25/16 1758)     Initial Impression / Assessment and Plan / ED Course  I have reviewed the triage vital signs and the nursing notes.  Pertinent labs & imaging results that were available during my care of the patient were reviewed by me and considered in my medical decision making (see chart for details).  36 yo M with PMHx as above here with mild fatigue in setting of increasing blood sugars at home. Out of metformin. On arrival, VSS. Exam reassuring. Labs show significant hyperglycemia but normal CO2, no AG or signs of DKA. He is o/w very well appearing. He did c/o transient SOB but CXR yesterday clear, breath sounds clear, and trop neg - do not suspect ACS, CHF, or other cardiopulm abnormality. He is not acidotic on labs. Will give IVF for hyperglycemia, SQ insulin, and d/c on metformin. Pt in agreement. Will d/c home with outpt PCP referral.   Final Clinical Impressions(s) / ED Diagnoses   Final diagnoses:  Hyperglycemia  Type 2 diabetes mellitus without complication, without long-term current use of insulin [facility])    New Prescriptions Discharge Medication List as of 06/25/2016  7:14 PM       Shaune Pollack, MD 06/25/16 2249

## 2016-06-25 NOTE — ED Notes (Signed)
Pt called for roomed  Assigned; no answer from waiting room

## 2016-06-25 NOTE — ED Triage Notes (Signed)
Pt. Stated, I've been SOB since last night. Mike Bowen been out of my BP and diabetes medication.

## 2016-06-25 NOTE — ED Notes (Signed)
Pt transported to XR.  

## 2016-06-25 NOTE — ED Notes (Addendum)
Pt reports being out of his Metformin and "a blood pressure medication" for a "week or week and a half"

## 2016-08-17 ENCOUNTER — Encounter (HOSPITAL_COMMUNITY): Payer: Self-pay

## 2016-08-17 ENCOUNTER — Emergency Department (HOSPITAL_COMMUNITY)
Admission: EM | Admit: 2016-08-17 | Discharge: 2016-08-17 | Disposition: A | Discharge status: Home / Self Care | Payer: Self-pay | Attending: Emergency Medicine | Admitting: Emergency Medicine

## 2016-08-17 DIAGNOSIS — E119 Type 2 diabetes mellitus without complications: Secondary | ICD-10-CM | POA: Insufficient documentation

## 2016-08-17 DIAGNOSIS — R197 Diarrhea, unspecified: Secondary | ICD-10-CM

## 2016-08-17 DIAGNOSIS — R112 Nausea with vomiting, unspecified: Secondary | ICD-10-CM

## 2016-08-17 DIAGNOSIS — I10 Essential (primary) hypertension: Secondary | ICD-10-CM | POA: Insufficient documentation

## 2016-08-17 DIAGNOSIS — Z7984 Long term (current) use of oral hypoglycemic drugs: Secondary | ICD-10-CM | POA: Insufficient documentation

## 2016-08-17 LAB — URINALYSIS, ROUTINE W REFLEX MICROSCOPIC
BILIRUBIN URINE: NEGATIVE
GLUCOSE, UA: NEGATIVE mg/dL
HGB URINE DIPSTICK: NEGATIVE
Ketones, ur: NEGATIVE mg/dL
Leukocytes, UA: NEGATIVE
NITRITE: NEGATIVE
PH: 6 (ref 5.0–8.0)
Protein, ur: 30 mg/dL — AB
Specific Gravity, Urine: 1.015 (ref 1.005–1.030)
Squamous Epithelial / LPF: NONE SEEN

## 2016-08-17 LAB — CBG MONITORING, ED: Glucose-Capillary: 150 mg/dL — ABNORMAL HIGH (ref 65–99)

## 2016-08-17 MED ORDER — ONDANSETRON 4 MG PO TBDP: 4.0000 mg | ORAL_TABLET | Freq: Three times a day (TID) | ORAL | 0 refills | Status: DC | PRN

## 2016-08-17 MED ORDER — LOPERAMIDE HCL 2 MG PO CAPS: 2.0000 mg | ORAL_CAPSULE | Freq: Four times a day (QID) | ORAL | 0 refills | Status: DC | PRN

## 2016-08-17 MED ORDER — LOPERAMIDE HCL 2 MG PO CAPS
4.0000 mg | ORAL_CAPSULE | Freq: Once | ORAL | Status: AC
  Administered 2016-08-17: 4 mg via ORAL
  Filled 2016-08-17: qty 2

## 2016-08-17 MED ORDER — ONDANSETRON 4 MG PO TBDP
4.0000 mg | ORAL_TABLET | Freq: Once | ORAL | Status: AC
  Administered 2016-08-17: 4 mg via ORAL
  Filled 2016-08-17: qty 1

## 2016-08-17 NOTE — ED Provider Notes (Signed)
[facility] Provider Note   CSN: [id number] Arrival date & time: 08/17/16  0011     History   Chief Complaint Chief Complaint  Patient presents with  . Abdominal Pain    HPI Mike Bowen is a 36 y.o. male.  HPI  This a 36 year old male with a history of diabetes who presents with abdominal pain, nausea, diarrhea. Patient reports onset of symptoms yesterday. He reports crampy abdominal pain. Currently 5 out of 10. He reports nonbilious, nonbloody emesis yesterday and diarrhea. He states that he hasn't eaten much today because of no appetite but has not had any recurrence of the vomiting or diarrhea. Denies fevers. He has not taken anything for his symptoms. Reports his blood sugars "good."  Past Medical History:  Diagnosis Date  . Diabetes mellitus without complication (HCC)   . Hypertension     There are no active problems to display for this patient.   Past Surgical History:  Procedure Laterality Date  . SHOULDER SURGERY         Home Medications    Prior to Admission medications   Medication Sig Start Date End Date Taking? Authorizing Provider  metFORMIN (GLUCOPHAGE) 500 MG tablet Take 1 tablet (500 mg total) by mouth 2 (two) times daily with a meal. 06/25/16 08/17/16 Yes Shaune Pollack, MD  cyclobenzaprine (FLEXERIL) 10 MG tablet Take 1 tablet (10 mg total) by mouth 3 (three) times daily as needed. Patient not taking: Reported on 04/01/2016 12/24/15   Pauline Aus, PA-C  HYDROcodone-acetaminophen (NORCO/VICODIN) 5-325 MG tablet Take 1 tablet by mouth every 4 (four) hours as needed. Patient not taking: Reported on 04/01/2016 01/07/16   Burgess Amor, PA-C  ibuprofen (ADVIL,MOTRIN) 800 MG tablet Take 1 tablet (800 mg total) by mouth 3 (three) times daily. 12/24/15   Triplett, Tammy, PA-C  loperamide (IMODIUM) 2 MG capsule Take 1 capsule (2 mg total) by mouth 4 (four) times daily as needed for diarrhea or loose stools. 08/17/16   Aundreya Souffrant, Mayer Masker, MD    ondansetron (ZOFRAN ODT) 4 MG disintegrating tablet Take 1 tablet (4 mg total) by mouth every 8 (eight) hours as needed for nausea or vomiting. 08/17/16   Trinita Devlin, Mayer Masker, MD  promethazine (PHENERGAN) 25 MG tablet Take 1 tablet (25 mg total) by mouth every 6 (six) hours as needed. 04/01/16   Donnetta Hutching, MD    Family History Family History  Problem Relation Age of Onset  . Diabetes Other     Social History Social History  Substance Use Topics  . Smoking status: Never Smoker  . Smokeless tobacco: Never Used  . Alcohol use Yes     Allergies   Patient has no known allergies.   Review of Systems Review of Systems  Constitutional: Negative for fever.  Respiratory: Negative for cough and shortness of breath.   Cardiovascular: Negative for chest pain.  Gastrointestinal: Positive for abdominal pain, diarrhea, nausea and vomiting.  Musculoskeletal: Positive for myalgias.  All other systems reviewed and are negative.    Physical Exam Updated Vital Signs BP (!) 146/88 (BP Location: Right Arm)   Pulse 89   Temp 99.7 F (37.6 C) (Oral)   Resp 16   Ht 6' (1.829 m)   Wt 102.1 kg (225 lb)   SpO2 99%   BMI 30.52 kg/m   Physical Exam  Constitutional: He is oriented to person, place, and time. He appears well-developed and well-nourished.  HENT:  Head: Normocephalic and atraumatic.  Cardiovascular: Normal rate, regular rhythm  and normal heart sounds.   No murmur heard. Pulmonary/Chest: Effort normal and breath sounds normal. No respiratory distress. He has no wheezes.  Abdominal: Soft. Bowel sounds are normal. There is no tenderness. There is no rebound.  Neurological: He is alert and oriented to person, place, and time.  Skin: Skin is warm and dry.  Psychiatric: He has a normal mood and affect.  Nursing note and vitals reviewed.    ED Treatments / Results  Labs (all labs ordered are listed, but only abnormal results are displayed) Labs Reviewed  URINALYSIS, ROUTINE W  REFLEX MICROSCOPIC - Abnormal; Notable for the following:       Result Value   Protein, ur 30 (*)    Bacteria, UA RARE (*)    All other components within normal limits  CBG MONITORING, ED - Abnormal; Notable for the following:    Glucose-Capillary 150 (*)    All other components within normal limits    EKG  EKG Interpretation None       Radiology No results found.  Procedures Procedures (including critical care time)  Medications Ordered in ED Medications  ondansetron (ZOFRAN-ODT) disintegrating tablet 4 mg (4 mg Oral Given 08/17/16 0058)  loperamide (IMODIUM) capsule 4 mg (4 mg Oral Given 08/17/16 0058)     Initial Impression / Assessment and Plan / ED Course  I have reviewed the triage vital signs and the nursing notes.  Pertinent labs & imaging results that were available during my care of the patient were reviewed by me and considered in my medical decision making (see chart for details).     Patient presents with nausea, vomiting, diarrhea, abdominal cramping. Nontoxic on exam. Vital signs reassuring. Exam is benign including abdominal exam. Suspect viral etiology as patient is well-appearing and appears hydrated. Glucose 150. No significant ketones in the urine to suggest dehydration. Patient given Imodium and Zofran. Will discharge with the same.  After history, exam, and medical workup I feel the patient has been appropriately medically screened and is safe for discharge home. Pertinent diagnoses were discussed with the patient. Patient was given return precautions.   Final Clinical Impressions(s) / ED Diagnoses   Final diagnoses:  Nausea vomiting and diarrhea    New Prescriptions New Prescriptions   LOPERAMIDE (IMODIUM) 2 MG CAPSULE    Take 1 capsule (2 mg total) by mouth 4 (four) times daily as needed for diarrhea or loose stools.   ONDANSETRON (ZOFRAN ODT) 4 MG DISINTEGRATING TABLET    Take 1 tablet (4 mg total) by mouth every 8 (eight) hours as needed for  nausea or vomiting.     Shon BatonHorton, Cortny Bambach F, MD 08/17/16 [phone]

## 2016-08-17 NOTE — ED Triage Notes (Signed)
Abd pain x 2 days, had some nausea and diarrhea yesterday only.  Pt also c/o generalized body aches.

## 2016-08-24 ENCOUNTER — Emergency Department (HOSPITAL_COMMUNITY): Payer: Self-pay

## 2016-08-24 ENCOUNTER — Emergency Department (HOSPITAL_COMMUNITY)
Admission: EM | Admit: 2016-08-24 | Discharge: 2016-08-24 | Disposition: A | Discharge status: Home / Self Care | Payer: Self-pay | Attending: Emergency Medicine | Admitting: Emergency Medicine

## 2016-08-24 ENCOUNTER — Encounter (HOSPITAL_COMMUNITY): Payer: Self-pay

## 2016-08-24 DIAGNOSIS — B353 Tinea pedis: Secondary | ICD-10-CM | POA: Insufficient documentation

## 2016-08-24 DIAGNOSIS — I10 Essential (primary) hypertension: Secondary | ICD-10-CM | POA: Insufficient documentation

## 2016-08-24 DIAGNOSIS — L03115 Cellulitis of right lower limb: Secondary | ICD-10-CM | POA: Insufficient documentation

## 2016-08-24 DIAGNOSIS — E119 Type 2 diabetes mellitus without complications: Secondary | ICD-10-CM | POA: Insufficient documentation

## 2016-08-24 DIAGNOSIS — Z79899 Other long term (current) drug therapy: Secondary | ICD-10-CM | POA: Insufficient documentation

## 2016-08-24 DIAGNOSIS — L089 Local infection of the skin and subcutaneous tissue, unspecified: Secondary | ICD-10-CM

## 2016-08-24 DIAGNOSIS — E11628 Type 2 diabetes mellitus with other skin complications: Secondary | ICD-10-CM

## 2016-08-24 LAB — COMPREHENSIVE METABOLIC PANEL
ALK PHOS: 57 U/L (ref 38–126)
ALT: 16 U/L — AB (ref 17–63)
AST: 22 U/L (ref 15–41)
Albumin: 3.4 g/dL — ABNORMAL LOW (ref 3.5–5.0)
Anion gap: 6 (ref 5–15)
BUN: 12 mg/dL (ref 6–20)
CALCIUM: 9.1 mg/dL (ref 8.9–10.3)
CHLORIDE: 99 mmol/L — AB (ref 101–111)
CO2: 28 mmol/L (ref 22–32)
CREATININE: 1.01 mg/dL (ref 0.61–1.24)
Glucose, Bld: 286 mg/dL — ABNORMAL HIGH (ref 65–99)
Potassium: 4.1 mmol/L (ref 3.5–5.1)
Sodium: 133 mmol/L — ABNORMAL LOW (ref 135–145)
Total Bilirubin: 0.5 mg/dL (ref 0.3–1.2)
Total Protein: 7.1 g/dL (ref 6.5–8.1)

## 2016-08-24 LAB — CBC WITH DIFFERENTIAL/PLATELET
BASOS PCT: 0 %
Basophils Absolute: 0 10*3/uL (ref 0.0–0.1)
EOS ABS: 0.1 10*3/uL (ref 0.0–0.7)
EOS PCT: 1 %
HCT: 40.4 % (ref 39.0–52.0)
Hemoglobin: 13.2 g/dL (ref 13.0–17.0)
LYMPHS ABS: 1.4 10*3/uL (ref 0.7–4.0)
Lymphocytes Relative: 14 %
MCH: 26 pg (ref 26.0–34.0)
MCHC: 32.7 g/dL (ref 30.0–36.0)
MCV: 79.7 fL (ref 78.0–100.0)
MONOS PCT: 5 %
Monocytes Absolute: 0.5 10*3/uL (ref 0.1–1.0)
NEUTROS PCT: 80 %
Neutro Abs: 8.1 10*3/uL — ABNORMAL HIGH (ref 1.7–7.7)
PLATELETS: 198 10*3/uL (ref 150–400)
RBC: 5.07 MIL/uL (ref 4.22–5.81)
RDW: 12.8 % (ref 11.5–15.5)
WBC: 10 10*3/uL (ref 4.0–10.5)

## 2016-08-24 LAB — I-STAT CG4 LACTIC ACID, ED: LACTIC ACID, VENOUS: 1.62 mmol/L (ref 0.5–1.9)

## 2016-08-24 MED ORDER — DOXYCYCLINE HYCLATE 100 MG PO CAPS: 100.0000 mg | ORAL_CAPSULE | Freq: Two times a day (BID) | ORAL | 0 refills | Status: DC

## 2016-08-24 MED ORDER — KETOCONAZOLE 2 % EX CREA: 1.0000 "application " | TOPICAL_CREAM | Freq: Every day | CUTANEOUS | 1 refills | Status: DC

## 2016-08-24 NOTE — ED Triage Notes (Signed)
Patient here with wound to right lateral aspect of little toe with drainage x 1 week. Denies known injury, pain and swelling to same. diabetes

## 2016-08-24 NOTE — ED Notes (Signed)
Care management at the bedside.

## 2016-08-24 NOTE — Discharge Instructions (Signed)
Contact a health care provider if: °You have a fever. °Your symptoms do not improve within 1-2 days of starting treatment. °Your bone or joint underneath the infected area becomes painful after the skin has healed. °Your infection returns in the same area or another area. °You notice a swollen bump in the infected area. °You develop new symptoms. °You have a general ill feeling (malaise) with muscle aches and pains. °Get help right away if: °Your symptoms get worse. °You feel very sleepy. °You develop vomiting or diarrhea that persists. °You notice red streaks coming from the infected area. °Your red area gets larger or turns dark in color. °

## 2016-08-24 NOTE — ED Notes (Signed)
ED Provider at bedside. 

## 2016-08-24 NOTE — ED Provider Notes (Signed)
[facility]-EMERGENCY DEPT Provider Note   CSN: [id number] Arrival date & time: 08/24/16  1417     History   Chief Complaint No chief complaint on file.   HPI Mike Bowen is a 36 y.o. male.who presents to the ED for Foot infection. He currently has no PCP or OP follow up . He noticed a "blister" on the outside of his right foot 4 days ago. 2 days ago he developed pain , swelling and pus from the wound. Hehas significatnt pain when trying to ambulate. He denies any fevers or chills. He does not have testing supplies at home and was unable to check his CBG.    HPI  Past Medical History:  Diagnosis Date  . Diabetes mellitus without complication (HCC)   . Hypertension     There are no active problems to display for this patient.   Past Surgical History:  Procedure Laterality Date  . SHOULDER SURGERY         Home Medications    Prior to Admission medications   Medication Sig Start Date End Date Taking? Authorizing Provider  metFORMIN (GLUCOPHAGE) 500 MG tablet Take 1 tablet (500 mg total) by mouth 2 (two) times daily with a meal. Patient taking differently: Take 1,000 mg by mouth 2 (two) times daily with a meal.  06/25/16 08/24/16 Yes Shaune Pollack, MD  cyclobenzaprine (FLEXERIL) 10 MG tablet Take 1 tablet (10 mg total) by mouth 3 (three) times daily as needed. Patient not taking: Reported on 04/01/2016 12/24/15   Pauline Aus, PA-C  doxycycline (VIBRAMYCIN) 100 MG capsule Take 1 capsule (100 mg total) by mouth 2 (two) times daily. 08/24/16   Arthor Captain, PA-C  HYDROcodone-acetaminophen (NORCO/VICODIN) 5-325 MG tablet Take 1 tablet by mouth every 4 (four) hours as needed. Patient not taking: Reported on 04/01/2016 01/07/16   Burgess Amor, PA-C  ibuprofen (ADVIL,MOTRIN) 800 MG tablet Take 1 tablet (800 mg total) by mouth 3 (three) times daily. Patient not taking: Reported on 08/24/2016 12/24/15   Pauline Aus, PA-C  ketoconazole (NIZORAL) 2 % cream Apply 1 application  topically daily. Apply to the feet and in between the toes once a day. 08/24/16   Arthor Captain, PA-C  loperamide (IMODIUM) 2 MG capsule Take 1 capsule (2 mg total) by mouth 4 (four) times daily as needed for diarrhea or loose stools. Patient not taking: Reported on 08/24/2016 08/17/16   Horton, Mayer Masker, MD  ondansetron (ZOFRAN ODT) 4 MG disintegrating tablet Take 1 tablet (4 mg total) by mouth every 8 (eight) hours as needed for nausea or vomiting. Patient not taking: Reported on 08/24/2016 08/17/16   Horton, Mayer Masker, MD  promethazine (PHENERGAN) 25 MG tablet Take 1 tablet (25 mg total) by mouth every 6 (six) hours as needed. Patient not taking: Reported on 08/24/2016 04/01/16   Donnetta Hutching, MD    Family History Family History  Problem Relation Age of Onset  . Diabetes Other     Social History Social History  Substance Use Topics  . Smoking status: Never Smoker  . Smokeless tobacco: Never Used  . Alcohol use Yes     Allergies   Patient has no known allergies.   Review of Systems Review of Systems  Ten systems reviewed and are negative for acute change, except as noted in the HPI.   Physical Exam Updated Vital Signs BP (!) 145/92   Pulse 82   Temp 98.9 F (37.2 C) (Oral)   Resp 18   SpO2 99%  Physical Exam  Constitutional: He appears well-developed and well-nourished. No distress.  HENT:  Head: Normocephalic and atraumatic.  Eyes: Conjunctivae and EOM are normal. Pupils are equal, round, and reactive to light. No scleral icterus.  Neck: Normal range of motion. Neck supple.  Cardiovascular: Normal rate, regular rhythm and normal heart sounds.   Pulmonary/Chest: Effort normal and breath sounds normal. No respiratory distress.  Abdominal: Soft. There is no tenderness.  Musculoskeletal: He exhibits no edema.  Neurological: He is alert.  Skin: Skin is warm and dry. He is not diaphoretic.  Psychiatric: His behavior is normal.  Nursing note and vitals  reviewed.    ED Treatments / Results  Labs (all labs ordered are listed, but only abnormal results are displayed) Labs Reviewed  COMPREHENSIVE METABOLIC PANEL - Abnormal; Notable for the following:       Result Value   Sodium 133 (*)    Chloride 99 (*)    Glucose, Bld 286 (*)    Albumin 3.4 (*)    ALT 16 (*)    All other components within normal limits  CBC WITH DIFFERENTIAL/PLATELET - Abnormal; Notable for the following:    Neutro Abs 8.1 (*)    All other components within normal limits  AEROBIC CULTURE (SUPERFICIAL SPECIMEN)  I-STAT CG4 LACTIC ACID, ED    EKG  EKG Interpretation None       Radiology No results found.  Procedures Procedures (including critical care time)  Medications Ordered in ED Medications - No data to display   Initial Impression / Assessment and Plan / ED Course  I have reviewed the triage vital signs and the nursing notes.  Pertinent labs & imaging results that were available during my care of the patient were reviewed by me and considered in my medical decision making (see chart for details).   patient with tinea and secondary cellulitis.  He has been given op f/u with wound clinic and sickle cell clinic for primary care and wound mgmt. Labs are reviewed and no emergent issues I doubt osteomyelitis. No signs of septic joint. Discussed home care and return precautions.  Final Clinical Impressions(s) / ED Diagnoses   Final diagnoses:  Diabetic foot infection (HCC)  Tinea pedis of both feet    New Prescriptions Discharge Medication List as of 08/24/2016 10:40 PM    START taking these medications   Details  doxycycline (VIBRAMYCIN) 100 MG capsule Take 1 capsule (100 mg total) by mouth 2 (two) times daily., Starting Thu 08/24/2016, Print    ketoconazole (NIZORAL) 2 % cream Apply 1 application topically daily. Apply to the feet and in between the toes once a day., Starting Thu 08/24/2016, Print         Tiburcio PeaHarris, ArmaAbigail,  PA-C 08/26/16 2028    Lavera GuiseLiu, Dana Duo, MD 08/29/16 1356

## 2016-08-24 NOTE — ED Notes (Signed)
Pt given gingerale per TualatinAbigail, GeorgiaPA.

## 2016-08-25 NOTE — ED Provider Notes (Signed)
Medical screening examination/treatment/procedure(s) were conducted as a shared visit with non-physician practitioner(s) and myself.  I personally evaluated the patient during the encounter.   EKG Interpretation None      36 year old male who presents with foot pain. History of DM. Noticed wound over right lateral foot that has started draining over past 3 days. Unsure of how he injured foot. No fever or chills, nausea, vomiting, malaise.   He is well appearing. No acute distress. Afebrile and HD stable. Normal lactate and no leukocytosis. Without systemic signs or symptoms of illness. There is small dime sized ulcer over lateral right foot at base of pinky. No active drainage. Does not probe to bone. Without significant surrounding erythema, warmth or induration. Poor follow-up, but discussed with case management who will arrange close follow-up for wound care and wound check. Will start on outpatient antibiotics.    Lavera GuiseLiu, Lafern Brinkley Duo, MD 08/25/16 [phone]

## 2016-08-27 LAB — AEROBIC CULTURE W GRAM STAIN (SUPERFICIAL SPECIMEN)

## 2016-08-27 LAB — AEROBIC CULTURE  (SUPERFICIAL SPECIMEN)

## 2016-08-28 ENCOUNTER — Telehealth: Payer: Self-pay | Admitting: Emergency Medicine

## 2016-08-28 NOTE — Telephone Encounter (Signed)
Post ED Visit - Positive Culture Follow-up  Culture report reviewed by antimicrobial stewardship pharmacist:  []  Mike Bowen, Pharm.D. []  Mike Bowen, Pharm.D., BCPS AQ-ID [x]  Mike Bowen, Pharm.D., BCPS []  Mike Bowen, [address]Ph[address]D., BCPS []  Mike Bowen, [address]Ph[address]D., BCPS, AAHIVP []  Mike Bowen, Pharm.D., BCPS, AAHIVP []  Mike Bowen, PharmD, BCPS []  Mike Bowen, PharmD, BCPS []  Mike Bowen, PharmD, BCPS  Positive wound culture Treated with doxycycline, organism sensitive to the same and no further patient follow-up is required at this time.  Mike Bowen, Mike Bowen 08/28/2016, 11:26 AM

## 2016-09-01 ENCOUNTER — Encounter (HOSPITAL_BASED_OUTPATIENT_CLINIC_OR_DEPARTMENT_OTHER): Payer: Self-pay | Attending: Internal Medicine

## 2016-09-01 DIAGNOSIS — L97512 Non-pressure chronic ulcer of other part of right foot with fat layer exposed: Secondary | ICD-10-CM | POA: Insufficient documentation

## 2016-09-01 DIAGNOSIS — I10 Essential (primary) hypertension: Secondary | ICD-10-CM | POA: Insufficient documentation

## 2016-09-01 DIAGNOSIS — B9561 Methicillin susceptible Staphylococcus aureus infection as the cause of diseases classified elsewhere: Secondary | ICD-10-CM | POA: Insufficient documentation

## 2016-09-01 DIAGNOSIS — L03115 Cellulitis of right lower limb: Secondary | ICD-10-CM | POA: Insufficient documentation

## 2016-09-01 DIAGNOSIS — E11621 Type 2 diabetes mellitus with foot ulcer: Secondary | ICD-10-CM | POA: Insufficient documentation

## 2016-09-08 LAB — GLUCOSE, CAPILLARY: Glucose-Capillary: 178 mg/dL — ABNORMAL HIGH (ref 65–99)

## 2016-09-29 ENCOUNTER — Encounter (HOSPITAL_BASED_OUTPATIENT_CLINIC_OR_DEPARTMENT_OTHER): Payer: Self-pay | Attending: Internal Medicine

## 2016-09-29 DIAGNOSIS — I10 Essential (primary) hypertension: Secondary | ICD-10-CM | POA: Insufficient documentation

## 2016-09-29 DIAGNOSIS — E11621 Type 2 diabetes mellitus with foot ulcer: Secondary | ICD-10-CM | POA: Insufficient documentation

## 2016-09-29 DIAGNOSIS — L97512 Non-pressure chronic ulcer of other part of right foot with fat layer exposed: Secondary | ICD-10-CM | POA: Insufficient documentation

## 2016-11-03 ENCOUNTER — Encounter (HOSPITAL_BASED_OUTPATIENT_CLINIC_OR_DEPARTMENT_OTHER): Payer: Self-pay | Attending: Internal Medicine

## 2016-11-03 DIAGNOSIS — B9561 Methicillin susceptible Staphylococcus aureus infection as the cause of diseases classified elsewhere: Secondary | ICD-10-CM | POA: Insufficient documentation

## 2016-11-03 DIAGNOSIS — E11621 Type 2 diabetes mellitus with foot ulcer: Secondary | ICD-10-CM | POA: Insufficient documentation

## 2016-11-03 DIAGNOSIS — L97512 Non-pressure chronic ulcer of other part of right foot with fat layer exposed: Secondary | ICD-10-CM | POA: Insufficient documentation

## 2016-11-03 DIAGNOSIS — L03115 Cellulitis of right lower limb: Secondary | ICD-10-CM | POA: Insufficient documentation

## 2016-11-03 DIAGNOSIS — I10 Essential (primary) hypertension: Secondary | ICD-10-CM | POA: Insufficient documentation

## 2016-11-24 ENCOUNTER — Other Ambulatory Visit: Payer: Self-pay | Admitting: Internal Medicine

## 2016-11-24 ENCOUNTER — Ambulatory Visit (HOSPITAL_COMMUNITY)
Admission: RE | Admit: 2016-11-24 | Discharge: 2016-11-24 | Disposition: A | Discharge status: Home / Self Care | Payer: Self-pay | Source: Ambulatory Visit | Attending: Internal Medicine | Admitting: Internal Medicine

## 2016-11-24 DIAGNOSIS — L97512 Non-pressure chronic ulcer of other part of right foot with fat layer exposed: Secondary | ICD-10-CM | POA: Insufficient documentation

## 2016-12-08 ENCOUNTER — Encounter (HOSPITAL_BASED_OUTPATIENT_CLINIC_OR_DEPARTMENT_OTHER): Payer: Self-pay | Attending: Internal Medicine

## 2016-12-08 DIAGNOSIS — Z8631 Personal history of diabetic foot ulcer: Secondary | ICD-10-CM | POA: Insufficient documentation

## 2016-12-08 DIAGNOSIS — Z09 Encounter for follow-up examination after completed treatment for conditions other than malignant neoplasm: Secondary | ICD-10-CM | POA: Insufficient documentation

## 2016-12-08 DIAGNOSIS — E119 Type 2 diabetes mellitus without complications: Secondary | ICD-10-CM | POA: Insufficient documentation

## 2016-12-08 DIAGNOSIS — I10 Essential (primary) hypertension: Secondary | ICD-10-CM | POA: Insufficient documentation

## 2016-12-25 ENCOUNTER — Encounter (HOSPITAL_COMMUNITY): Payer: Self-pay | Admitting: *Deleted

## 2016-12-25 DIAGNOSIS — L97519 Non-pressure chronic ulcer of other part of right foot with unspecified severity: Secondary | ICD-10-CM | POA: Insufficient documentation

## 2016-12-25 DIAGNOSIS — E11621 Type 2 diabetes mellitus with foot ulcer: Secondary | ICD-10-CM | POA: Insufficient documentation

## 2016-12-25 DIAGNOSIS — L03115 Cellulitis of right lower limb: Secondary | ICD-10-CM | POA: Insufficient documentation

## 2016-12-25 DIAGNOSIS — Z7984 Long term (current) use of oral hypoglycemic drugs: Secondary | ICD-10-CM | POA: Insufficient documentation

## 2016-12-25 DIAGNOSIS — I10 Essential (primary) hypertension: Secondary | ICD-10-CM | POA: Insufficient documentation

## 2016-12-25 DIAGNOSIS — M869 Osteomyelitis, unspecified: Secondary | ICD-10-CM | POA: Insufficient documentation

## 2016-12-25 DIAGNOSIS — Z79899 Other long term (current) drug therapy: Secondary | ICD-10-CM | POA: Insufficient documentation

## 2016-12-25 NOTE — ED Triage Notes (Signed)
Recheck wound on right foot

## 2016-12-26 ENCOUNTER — Emergency Department (HOSPITAL_COMMUNITY): Payer: Self-pay

## 2016-12-26 ENCOUNTER — Emergency Department (HOSPITAL_COMMUNITY)
Admission: EM | Admit: 2016-12-26 | Discharge: 2016-12-26 | Disposition: A | Discharge status: Home / Self Care | Payer: Self-pay | Attending: Emergency Medicine | Admitting: Emergency Medicine

## 2016-12-26 DIAGNOSIS — L97519 Non-pressure chronic ulcer of other part of right foot with unspecified severity: Secondary | ICD-10-CM

## 2016-12-26 DIAGNOSIS — M869 Osteomyelitis, unspecified: Secondary | ICD-10-CM

## 2016-12-26 DIAGNOSIS — E11621 Type 2 diabetes mellitus with foot ulcer: Secondary | ICD-10-CM

## 2016-12-26 DIAGNOSIS — L03115 Cellulitis of right lower limb: Secondary | ICD-10-CM

## 2016-12-26 LAB — CBC WITH DIFFERENTIAL/PLATELET
Basophils Absolute: 0 10*3/uL (ref 0.0–0.1)
Basophils Relative: 0 %
EOS ABS: 0.1 10*3/uL (ref 0.0–0.7)
Eosinophils Relative: 2 %
HCT: 40.3 % (ref 39.0–52.0)
HEMOGLOBIN: 13.5 g/dL (ref 13.0–17.0)
LYMPHS ABS: 2 10*3/uL (ref 0.7–4.0)
Lymphocytes Relative: 31 %
MCH: 26 pg (ref 26.0–34.0)
MCHC: 33.5 g/dL (ref 30.0–36.0)
MCV: 77.5 fL — ABNORMAL LOW (ref 78.0–100.0)
MONO ABS: 0.4 10*3/uL (ref 0.1–1.0)
MONOS PCT: 6 %
NEUTROS PCT: 61 %
Neutro Abs: 4 10*3/uL (ref 1.7–7.7)
Platelets: 136 10*3/uL — ABNORMAL LOW (ref 150–400)
RBC: 5.2 MIL/uL (ref 4.22–5.81)
RDW: 12.2 % (ref 11.5–15.5)
WBC: 6.5 10*3/uL (ref 4.0–10.5)

## 2016-12-26 LAB — COMPREHENSIVE METABOLIC PANEL
ALBUMIN: 3.4 g/dL — AB (ref 3.5–5.0)
ALK PHOS: 69 U/L (ref 38–126)
ALT: 13 U/L — AB (ref 17–63)
AST: 16 U/L (ref 15–41)
Anion gap: 8 (ref 5–15)
BUN: 17 mg/dL (ref 6–20)
CALCIUM: 8.7 mg/dL — AB (ref 8.9–10.3)
CHLORIDE: 95 mmol/L — AB (ref 101–111)
CO2: 27 mmol/L (ref 22–32)
CREATININE: 0.81 mg/dL (ref 0.61–1.24)
GFR calc Af Amer: >60 mL/min (ref >60)
GFR calc non Af Amer: >60 mL/min (ref >60)
GLUCOSE: 328 mg/dL — AB (ref 65–99)
Potassium: 3.9 mmol/L (ref 3.5–5.1)
Sodium: 130 mmol/L — ABNORMAL LOW (ref 135–145)
Total Bilirubin: 0.6 mg/dL (ref 0.3–1.2)
Total Protein: 7 g/dL (ref 6.5–8.1)

## 2016-12-26 LAB — SEDIMENTATION RATE: Sed Rate: 26 mm/hr — ABNORMAL HIGH (ref 0–16)

## 2016-12-26 LAB — C-REACTIVE PROTEIN: CRP: 2.2 mg/dL — ABNORMAL HIGH (ref <1.0)

## 2016-12-26 MED ORDER — PIPERACILLIN-TAZOBACTAM 3.375 G IVPB 30 MIN
3.3750 g | Freq: Once | INTRAVENOUS | Status: AC
  Administered 2016-12-26: 3.375 g via INTRAVENOUS
  Filled 2016-12-26: qty 50

## 2016-12-26 MED ORDER — AMOXICILLIN-POT CLAVULANATE 875-125 MG PO TABS: 1.0000 | ORAL_TABLET | Freq: Two times a day (BID) | ORAL | 0 refills | Status: DC

## 2016-12-26 MED ORDER — VANCOMYCIN HCL IN DEXTROSE 1-5 GM/200ML-% IV SOLN
1000.0000 mg | Freq: Once | INTRAVENOUS | Status: AC
  Administered 2016-12-26: 1000 mg via INTRAVENOUS
  Filled 2016-12-26: qty 200

## 2016-12-26 MED ORDER — SODIUM CHLORIDE 0.9 % IV BOLUS (SEPSIS)
500.0000 mL | Freq: Once | INTRAVENOUS | Status: AC
  Administered 2016-12-26: 500 mL via INTRAVENOUS

## 2016-12-26 MED ORDER — INSULIN ASPART 100 UNIT/ML ~~LOC~~ SOLN
5.0000 [IU] | Freq: Once | SUBCUTANEOUS | Status: AC
  Administered 2016-12-26: 5 [IU] via INTRAVENOUS
  Filled 2016-12-26: qty 1

## 2016-12-26 MED ORDER — SODIUM CHLORIDE 0.9 % IV BOLUS (SEPSIS)
1000.0000 mL | Freq: Once | INTRAVENOUS | Status: AC
  Administered 2016-12-26: 1000 mL via INTRAVENOUS

## 2016-12-26 NOTE — ED Notes (Signed)
Patient transported to X-ray 

## 2016-12-26 NOTE — ED Provider Notes (Signed)
[facility] EMERGENCY DEPARTMENT Provider Note   CSN: [id number] Arrival date & time: 12/25/16  2223  Time seen 12:15 AM   History   Chief Complaint Chief Complaint  Patient presents with  . Wound Check    HPI Mike Bowen is a 36 y.o. male.  HPI patient states in June he had a wound on his lateral right foot that he was treated at the wound center at [facility].  He was discharged from their practice about 2 weeks ago because it had healed.  However he reports on Friday October 26 he started having increasing pain and had some bleeding from the area.  Since then it has been having clear yellow drainage.  He also states his foot started swelling yesterday, October 28.  He states it looks worse now than when he first went to the wound center.  He denies fever.  He states his CBGs have been in the 160s however he ran out of his diabetic medicine week ago.  He has an [facility] with [facility]  He denies any trauma including new shoes.  He is unsure when his last tetanus was.  [facility]  Past Medical History:  Diagnosis Date  . Diabetes mellitus without complication (HCC)   . Hypertension     There are no active problems to display for this patient.   Past Surgical History:  Procedure Laterality Date  . SHOULDER SURGERY         Home Medications    Prior to Admission medications   Medication Sig Start Date End Date Taking? Authorizing Provider  amoxicillin-clavulanate (AUGMENTIN) 875-125 MG tablet Take 1 tablet by mouth every 12 (twelve) hours. 12/26/16   Devoria Albe, MD  cyclobenzaprine (FLEXERIL) 10 MG tablet Take 1 tablet (10 mg total) by mouth 3 (three) times daily as needed. Patient not taking: Reported on 04/01/2016 12/24/15   Pauline Aus, PA-C  doxycycline (VIBRAMYCIN) 100 MG capsule Take 1 capsule (100 mg total) by mouth 2 (two) times daily. 08/24/16   Arthor Captain, PA-C  HYDROcodone-acetaminophen  (NORCO/VICODIN) 5-325 MG tablet Take 1 tablet by mouth every 4 (four) hours as needed. Patient not taking: Reported on 04/01/2016 01/07/16   Burgess Amor, PA-C  ibuprofen (ADVIL,MOTRIN) 800 MG tablet Take 1 tablet (800 mg total) by mouth 3 (three) times daily. Patient not taking: Reported on 08/24/2016 12/24/15   Pauline Aus, PA-C  ketoconazole (NIZORAL) 2 % cream Apply 1 application topically daily. Apply to the feet and in between the toes once a day. 08/24/16   Arthor Captain, PA-C  loperamide (IMODIUM) 2 MG capsule Take 1 capsule (2 mg total) by mouth 4 (four) times daily as needed for diarrhea or loose stools. Patient not taking: Reported on 08/24/2016 08/17/16   Horton, Mayer Masker, MD  metFORMIN (GLUCOPHAGE) 500 MG tablet Take 1 tablet (500 mg total) by mouth 2 (two) times daily with a meal. Patient taking differently: Take 1,000 mg by mouth 2 (two) times daily with a meal.  06/25/16 08/24/16  Shaune Pollack, MD  ondansetron (ZOFRAN ODT) 4 MG disintegrating tablet Take 1 tablet (4 mg total) by mouth every 8 (eight) hours as needed for nausea or vomiting. Patient not taking: Reported on 08/24/2016 08/17/16   Horton, Mayer Masker, MD  promethazine (PHENERGAN) 25 MG tablet Take 1 tablet (25 mg total) by mouth every 6 (six) hours as needed. Patient not taking: Reported on 08/24/2016 04/01/16  Donnetta Hutching, MD    Family History Family History  Problem Relation Age of Onset  . Diabetes Other     Social History Social History  Substance Use Topics  . Smoking status: Never Smoker  . Smokeless tobacco: Never Used  . Alcohol use Yes  employed   Allergies   Patient has no known allergies.   Review of Systems Review of Systems  All other systems reviewed and are negative.    Physical Exam Updated Vital Signs BP (!) 153/104 (BP Location: Right Arm)   Pulse 86   Temp 98.4 F (36.9 C) (Oral)   Resp 18   Ht 6' (1.829 m)   Wt 102.1 kg (225 lb)   SpO2 100%   BMI 30.52 kg/m   Vital  signs normal except hypertension   Physical Exam  Constitutional: He is oriented to person, place, and time. He appears well-developed and well-nourished.  Non-toxic appearance. He does not appear ill. No distress.  HENT:  Head: Normocephalic and atraumatic.  Right Ear: External ear normal.  Left Ear: External ear normal.  Nose: Nose normal.  Eyes: Conjunctivae and EOM are normal.  Neck: Normal range of motion and full passive range of motion without pain.  Cardiovascular: Normal rate.   No murmur heard. Pulmonary/Chest: Effort normal. No respiratory distress. He has no rhonchi. He exhibits no crepitus.  Abdominal: Normal appearance.  Musculoskeletal: Normal range of motion. He exhibits edema. He exhibits no tenderness.  Moves all extremities well.   Neurological: He is alert and oriented to person, place, and time. He has normal strength. No cranial nerve deficit.  Skin: Skin is warm, dry and intact. No rash noted. No erythema. No pallor.  Patient has a 3-1/2 x 3-1/2 cm flaccid bullous type lesion on the right foot near the MTP joint of the little toe.  He is noted to have diffuse swelling of his right foot compared to the left with increased redness and warmth.  Psychiatric: He has a normal mood and affect. His speech is normal and behavior is normal. His mood appears not anxious.  Nursing note and vitals reviewed.        ED Treatments / Results  Labs (all labs ordered are listed, but only abnormal results are displayed) Results for orders placed or performed during the hospital encounter of 12/26/16  Comprehensive metabolic panel  Result Value Ref Range   Sodium 130 (L) 135 - 145 mmol/L   Potassium 3.9 3.5 - 5.1 mmol/L   Chloride 95 (L) 101 - 111 mmol/L   CO2 27 22 - 32 mmol/L   Glucose, Bld 328 (H) 65 - 99 mg/dL   BUN 17 6 - 20 mg/dL   Creatinine, Ser 1.61 0.61 - 1.24 mg/dL   Calcium 8.7 (L) 8.9 - 10.3 mg/dL   Total Protein 7.0 6.5 - 8.1 g/dL   Albumin 3.4 (L) 3.5 -  5.0 g/dL   AST 16 15 - 41 U/L   ALT 13 (L) 17 - 63 U/L   Alkaline Phosphatase 69 38 - 126 U/L   Total Bilirubin 0.6 0.3 - 1.2 mg/dL   GFR calc non Af Amer >60 >60 mL/min   GFR calc Af Amer >60 >60 mL/min   Anion gap 8 5 - 15  CBC with Differential  Result Value Ref Range   WBC 6.5 4.0 - 10.5 K/uL   RBC 5.20 4.22 - 5.81 MIL/uL   Hemoglobin 13.5 13.0 - 17.0 g/dL   HCT 09.6 04.5 -  52.0 %   MCV 77.5 (L) 78.0 - 100.0 fL   MCH 26.0 26.0 - 34.0 pg   MCHC 33.5 30.0 - 36.0 g/dL   RDW 95.612.2 21.311.5 - 08.615.5 %   Platelets 136 (L) 150 - 400 K/uL   Neutrophils Relative % 61 %   Neutro Abs 4.0 1.7 - 7.7 K/uL   Lymphocytes Relative 31 %   Lymphs Abs 2.0 0.7 - 4.0 K/uL   Monocytes Relative 6 %   Monocytes Absolute 0.4 0.1 - 1.0 K/uL   Eosinophils Relative 2 %   Eosinophils Absolute 0.1 0.0 - 0.7 K/uL   Basophils Relative 0 %   Basophils Absolute 0.0 0.0 - 0.1 K/uL  Sedimentation rate  Result Value Ref Range   Sed Rate 26 (H) 0 - 16 mm/hr   Laboratory interpretation all normal except mild elevation of SED rate    EKG  EKG Interpretation None       Radiology Dg Foot Complete Right  Result Date: 12/26/2016 CLINICAL DATA:  Diabetic foot ulcer wall for metatarsal phalangeal joint of levels toe. EXAM: RIGHT FOOT COMPLETE - 3+ VIEW COMPARISON:  Radiographs 11/24/2016 FINDINGS: Soft tissue thickening and skin irregularity about the fifth digit metatarsal phalangeal joint. Small focus with decreased bone mineral density of the little toe proximal phalanx laterally seen only on the AP view. There is otherwise no radiographic evidence of osteomyelitis or bony destructive change. 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 No radiopaque foreign body. IMPRESSION: Soft tissue thickening and skin irregularity about the fifth digit metatarsal phalangeal joint. Small focus of decreased bone density of the little toe proximal phalanx in the region of skin thickening seen only on  a single view, may reflect early osteomyelitis. Electronically Signed   By: Rubye OaksMelanie  Ehinger M.D.   On: 12/26/2016 01:09    Procedures Procedures (including critical care time)  Medications Ordered in ED Medications  vancomycin (VANCOCIN) IVPB 1000 mg/200 mL premix (0 mg Intravenous Stopped 12/26/16 0324)  piperacillin-tazobactam (ZOSYN) IVPB 3.375 g (0 g Intravenous Stopped 12/26/16 0202)  sodium chloride 0.9 % bolus 1,000 mL (0 mLs Intravenous Stopped 12/26/16 0324)  sodium chloride 0.9 % bolus 500 mL (500 mLs Intravenous New Bag/Given 12/26/16 0203)  insulin aspart (novoLOG) injection 5 Units (5 Units Intravenous Given 12/26/16 0131)     Initial Impression / Assessment and Plan / ED Course  I have reviewed the triage vital signs and the nursing notes.  Pertinent labs & imaging results that were available during my care of the patient were reviewed by me and considered in my medical decision making (see chart for details).    She was started on IV antibiotics, and he was given IV insulin for his hyperglycemia.  We discussed his test results.  Patient does not want to be admitted, he states he does not have anybody to help him with child care and he does not have insurance.  He was started on oral antibiotics.  He already has an appointment at the [facility] clinic in the next 2 weeks.  He was encouraged to get back to the wound center for further care.  He should return to the emergency department if his symptoms progressively get worse.   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   Plan discharge  Devoria Albe,  MD, Concha Pyo, MD 12/26/16 [phone]

## 2016-12-26 NOTE — Discharge Instructions (Signed)
You did not want to be admitted today for your foot infection. Clean your foot daily with soap and water. Take the antibiotic until gone. [facility] You need to start going back to the wound center. Return if you get fever, chills, the redness and swelling continue to spread up your leg, or you seem to be getting worse instead of better.

## 2016-12-28 ENCOUNTER — Encounter (HOSPITAL_BASED_OUTPATIENT_CLINIC_OR_DEPARTMENT_OTHER): Payer: Self-pay | Attending: Internal Medicine

## 2016-12-28 DIAGNOSIS — I10 Essential (primary) hypertension: Secondary | ICD-10-CM | POA: Insufficient documentation

## 2016-12-28 DIAGNOSIS — L03115 Cellulitis of right lower limb: Secondary | ICD-10-CM | POA: Insufficient documentation

## 2016-12-28 DIAGNOSIS — M869 Osteomyelitis, unspecified: Secondary | ICD-10-CM | POA: Insufficient documentation

## 2016-12-28 DIAGNOSIS — E11621 Type 2 diabetes mellitus with foot ulcer: Secondary | ICD-10-CM | POA: Insufficient documentation

## 2016-12-28 DIAGNOSIS — L84 Corns and callosities: Secondary | ICD-10-CM | POA: Insufficient documentation

## 2016-12-28 DIAGNOSIS — E1169 Type 2 diabetes mellitus with other specified complication: Secondary | ICD-10-CM | POA: Insufficient documentation

## 2016-12-28 DIAGNOSIS — L97512 Non-pressure chronic ulcer of other part of right foot with fat layer exposed: Secondary | ICD-10-CM | POA: Insufficient documentation

## 2016-12-28 LAB — AEROBIC CULTURE  (SUPERFICIAL SPECIMEN): SPECIAL REQUESTS: NORMAL

## 2016-12-29 ENCOUNTER — Telehealth: Payer: Self-pay | Admitting: *Deleted

## 2016-12-29 ENCOUNTER — Other Ambulatory Visit (HOSPITAL_COMMUNITY)
Admission: RE | Admit: 2016-12-29 | Discharge: 2016-12-29 | Disposition: A | Discharge status: Home / Self Care | Payer: Self-pay | Source: Other Acute Inpatient Hospital | Attending: Internal Medicine | Admitting: Internal Medicine

## 2016-12-29 DIAGNOSIS — S91301A Unspecified open wound, right foot, initial encounter: Secondary | ICD-10-CM | POA: Insufficient documentation

## 2016-12-29 NOTE — Telephone Encounter (Signed)
Post ED Visit - Positive Culture Follow-up  Culture report reviewed by antimicrobial stewardship pharmacist:  [x]  Enzo BiNathan Batchelder, Pharm.D. []  Celedonio MiyamotoJeremy Frens, Pharm.D., BCPS AQ-ID []  Garvin FilaMike Maccia, Pharm.D., BCPS []  Georgina PillionElizabeth Martin, [address]Pharm[address]D., BCPS []  Silver CreekMinh Pham, [address]Pharm[address]D., BCPS, AAHIVP []  Estella HuskMichelle Turner, Pharm.D., BCPS, AAHIVP []  Lysle Pearlachel Rumbarger, PharmD, BCPS []  Casilda Carlsaylor Stone, PharmD, BCPS []  Pollyann SamplesAndy Johnston, PharmD, BCPS  Positive wound culture Treated with Amoxicillin-Pot Clavulanate, organism sensitive to the same and no further patient follow-up is required at this time.  Virl AxeRobertson, Mike Bowen 12/29/2016, 10:04 AM

## 2017-01-01 LAB — AEROBIC CULTURE  (SUPERFICIAL SPECIMEN)

## 2017-01-01 LAB — AEROBIC CULTURE W GRAM STAIN (SUPERFICIAL SPECIMEN): Culture: NO GROWTH

## 2017-01-05 ENCOUNTER — Other Ambulatory Visit: Payer: Self-pay | Admitting: Internal Medicine

## 2017-01-05 DIAGNOSIS — L97519 Non-pressure chronic ulcer of other part of right foot with unspecified severity: Secondary | ICD-10-CM

## 2017-01-10 ENCOUNTER — Ambulatory Visit (HOSPITAL_COMMUNITY)
Admission: RE | Admit: 2017-01-10 | Discharge: 2017-01-10 | Disposition: A | Discharge status: Home / Self Care | Payer: Self-pay | Source: Ambulatory Visit | Attending: Internal Medicine | Admitting: Internal Medicine

## 2017-01-10 DIAGNOSIS — R9389 Abnormal findings on diagnostic imaging of other specified body structures: Secondary | ICD-10-CM | POA: Insufficient documentation

## 2017-01-10 DIAGNOSIS — L03031 Cellulitis of right toe: Secondary | ICD-10-CM | POA: Insufficient documentation

## 2017-01-10 DIAGNOSIS — L97519 Non-pressure chronic ulcer of other part of right foot with unspecified severity: Secondary | ICD-10-CM | POA: Insufficient documentation

## 2017-01-10 MED ORDER — GADOBENATE DIMEGLUMINE 529 MG/ML IV SOLN
20.0000 mL | Freq: Once | INTRAVENOUS | Status: AC | PRN
  Administered 2017-01-10: 20 mL via INTRAVENOUS

## 2017-02-02 ENCOUNTER — Encounter (HOSPITAL_BASED_OUTPATIENT_CLINIC_OR_DEPARTMENT_OTHER): Payer: Self-pay | Attending: Internal Medicine

## 2017-02-02 DIAGNOSIS — I10 Essential (primary) hypertension: Secondary | ICD-10-CM | POA: Insufficient documentation

## 2017-02-02 DIAGNOSIS — Z7984 Long term (current) use of oral hypoglycemic drugs: Secondary | ICD-10-CM | POA: Insufficient documentation

## 2017-02-02 DIAGNOSIS — E11621 Type 2 diabetes mellitus with foot ulcer: Secondary | ICD-10-CM | POA: Insufficient documentation

## 2017-02-02 DIAGNOSIS — L97512 Non-pressure chronic ulcer of other part of right foot with fat layer exposed: Secondary | ICD-10-CM | POA: Insufficient documentation

## 2017-10-26 IMAGING — CR DG FOOT COMPLETE 3+V*R*
3 series · 3 of 3 positions shown · non-contrast
Comparison: None.

CLINICAL DATA: Wound along the lateral aspect of the fifth toe with
drainage, diabetes

EXAM:
RIGHT FOOT COMPLETE - 3+ VIEW

[foot ap]
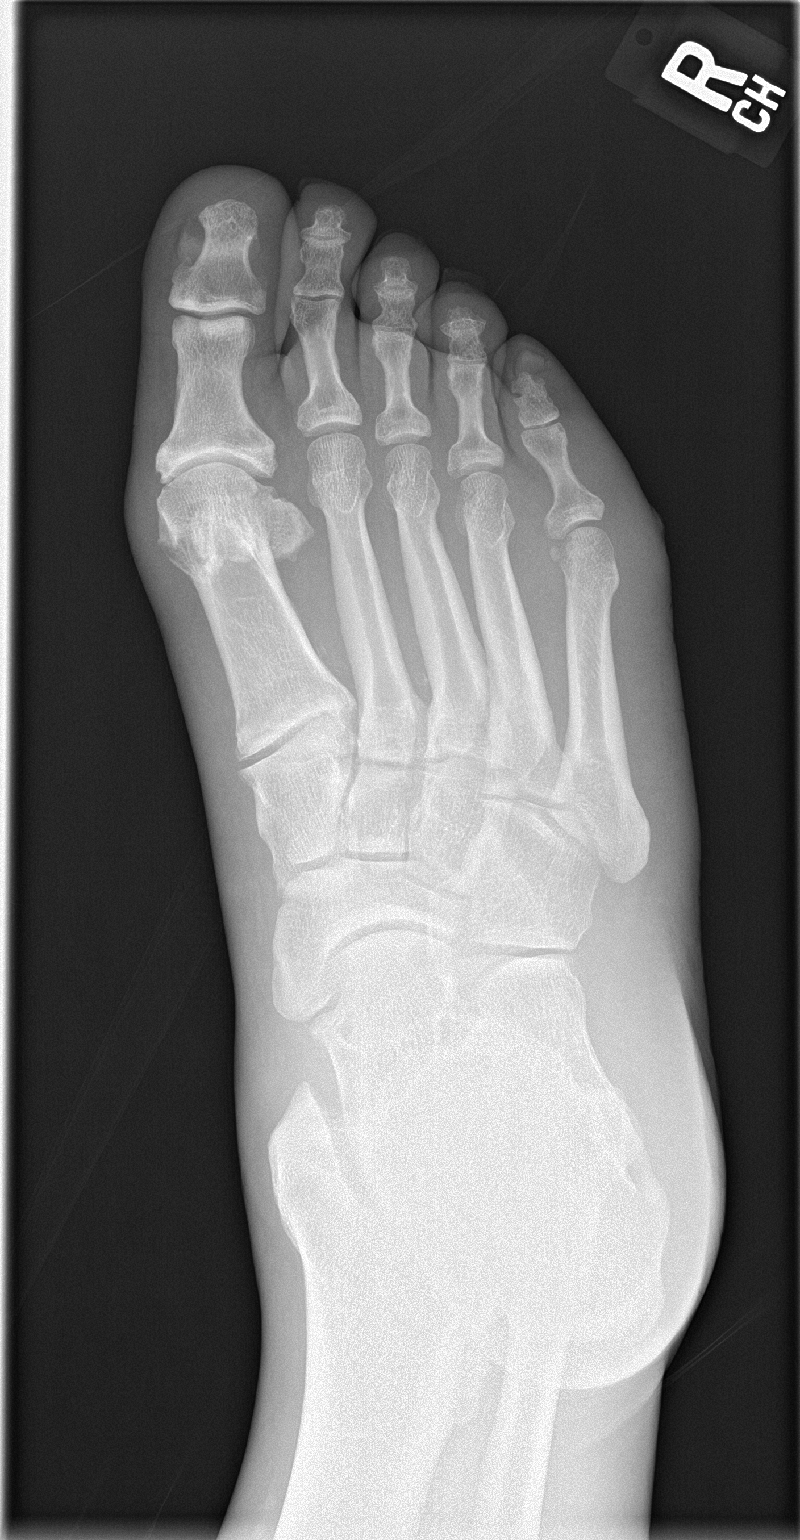

[foot obl]
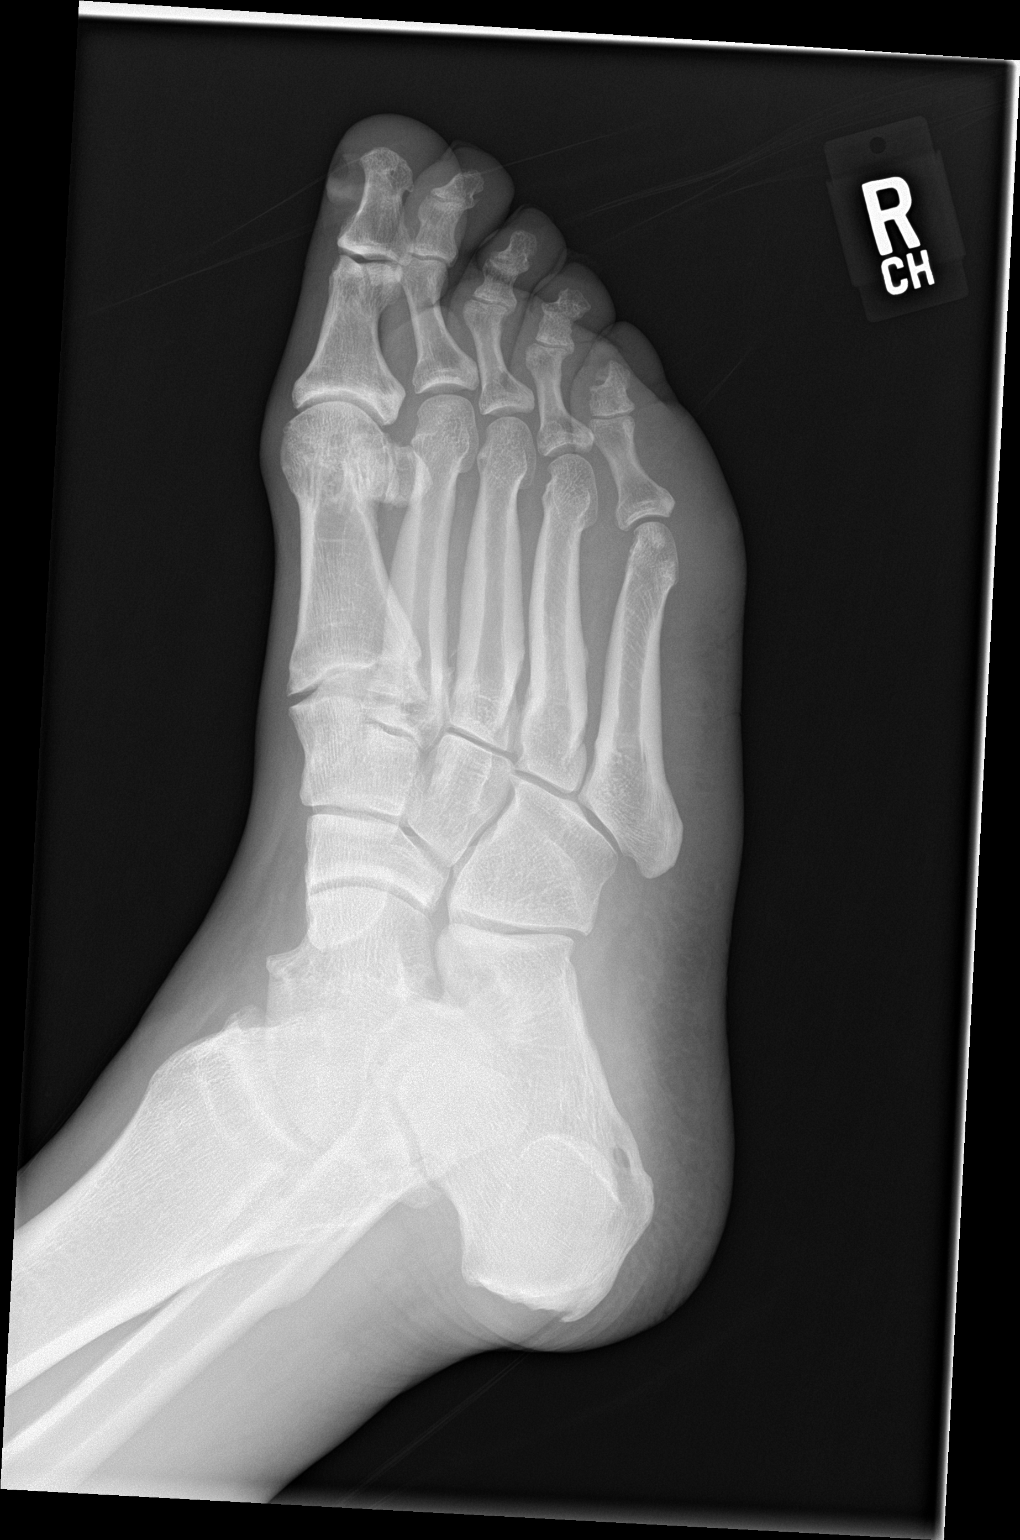

[foot lat]
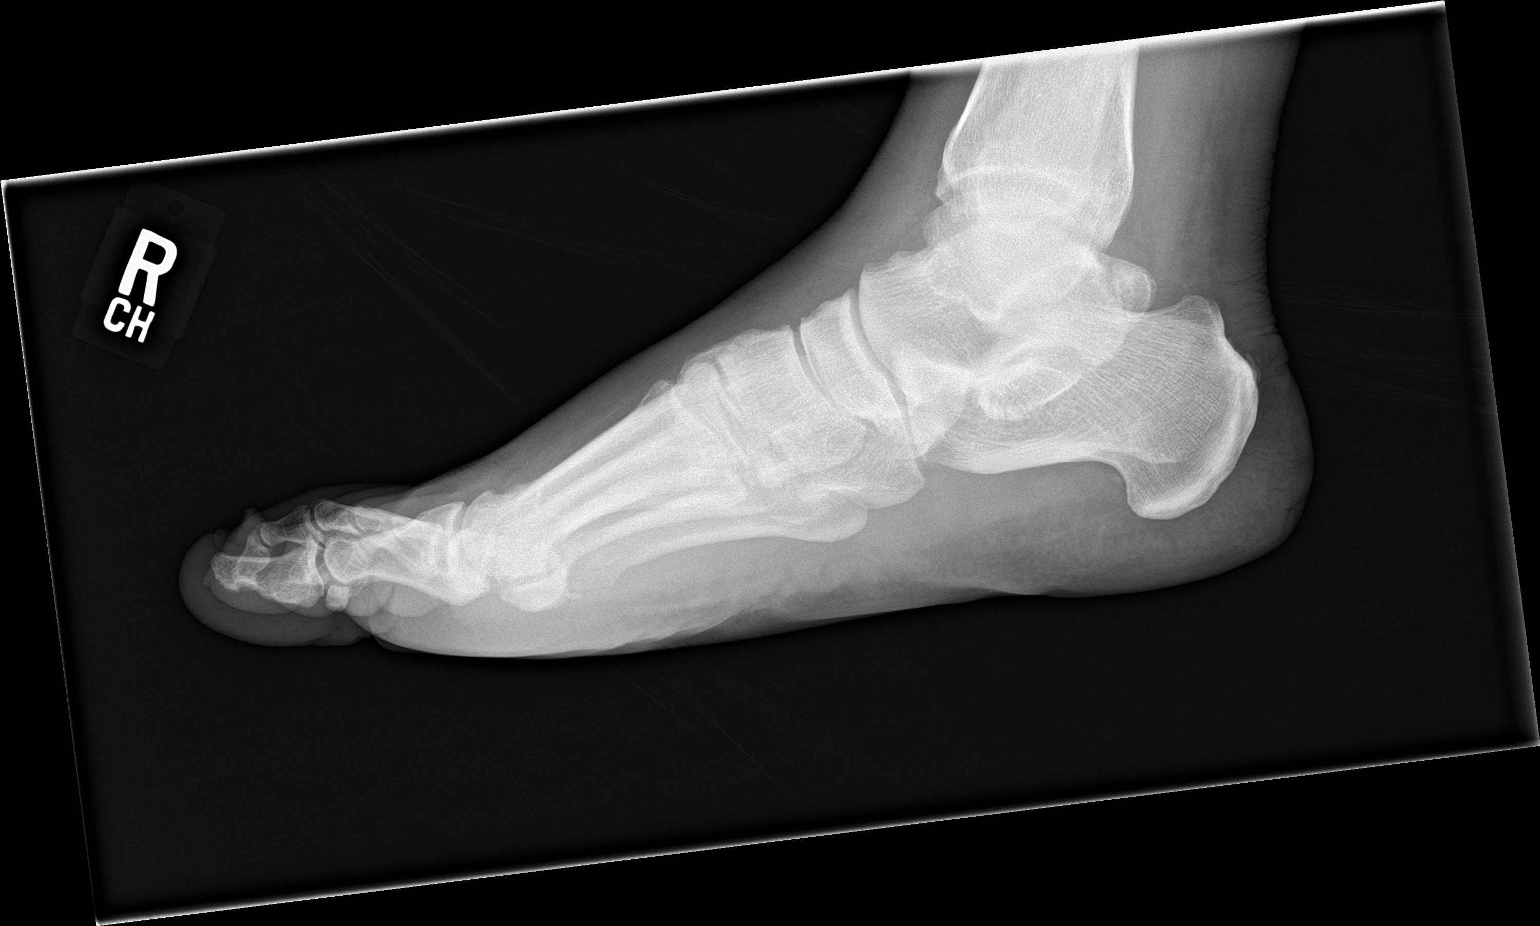

[3 of 3 positions shown; findings below may reference images not displayed]

FINDINGS: There is focal soft tissue swelling lateral to the right fifth MTP
joint and at the base of the fifth toe. However no radiographic
evidence currently of osteomyelitis is seen. There is degenerative
joint disease of the right first MTP joint.
IMPRESSION: 1. No current radiographic evidence of osteomyelitis.
2. Degenerative change of the right first MTP joint.

## 2018-02-27 IMAGING — DX DG FOOT COMPLETE 3+V*R*
3 series · 3 of 3 positions shown · non-contrast
Comparison: Radiographs 11/24/2016

CLINICAL DATA: Diabetic foot ulcer wall for metatarsal phalangeal
joint of levels toe.

EXAM:
RIGHT FOOT COMPLETE - 3+ VIEW

[foot ap]
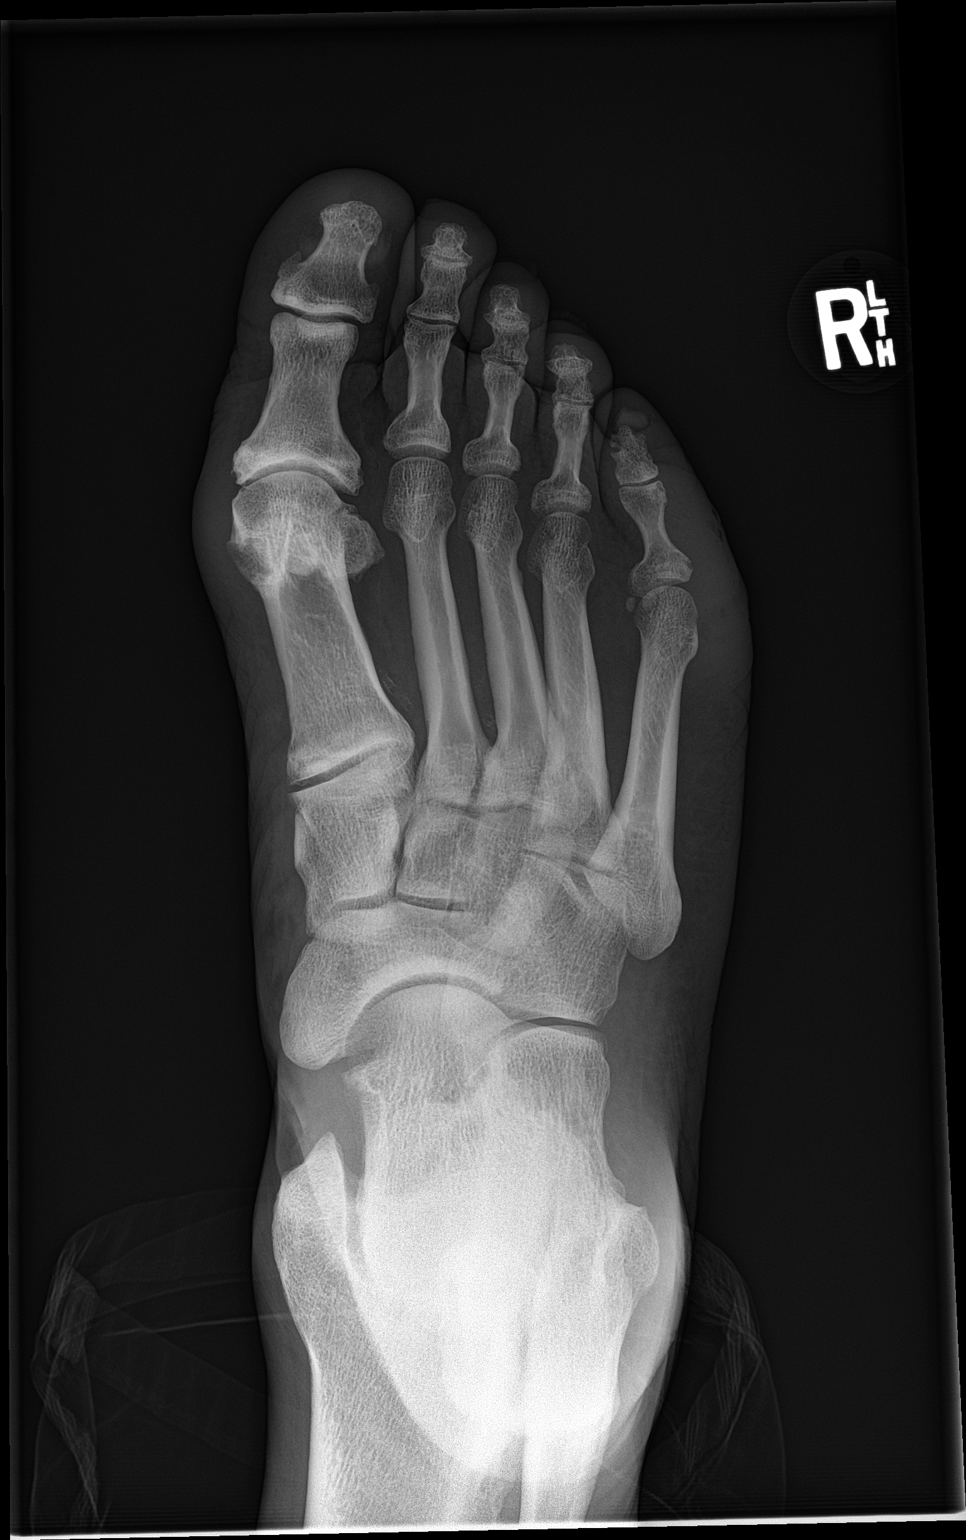

[foot obl]
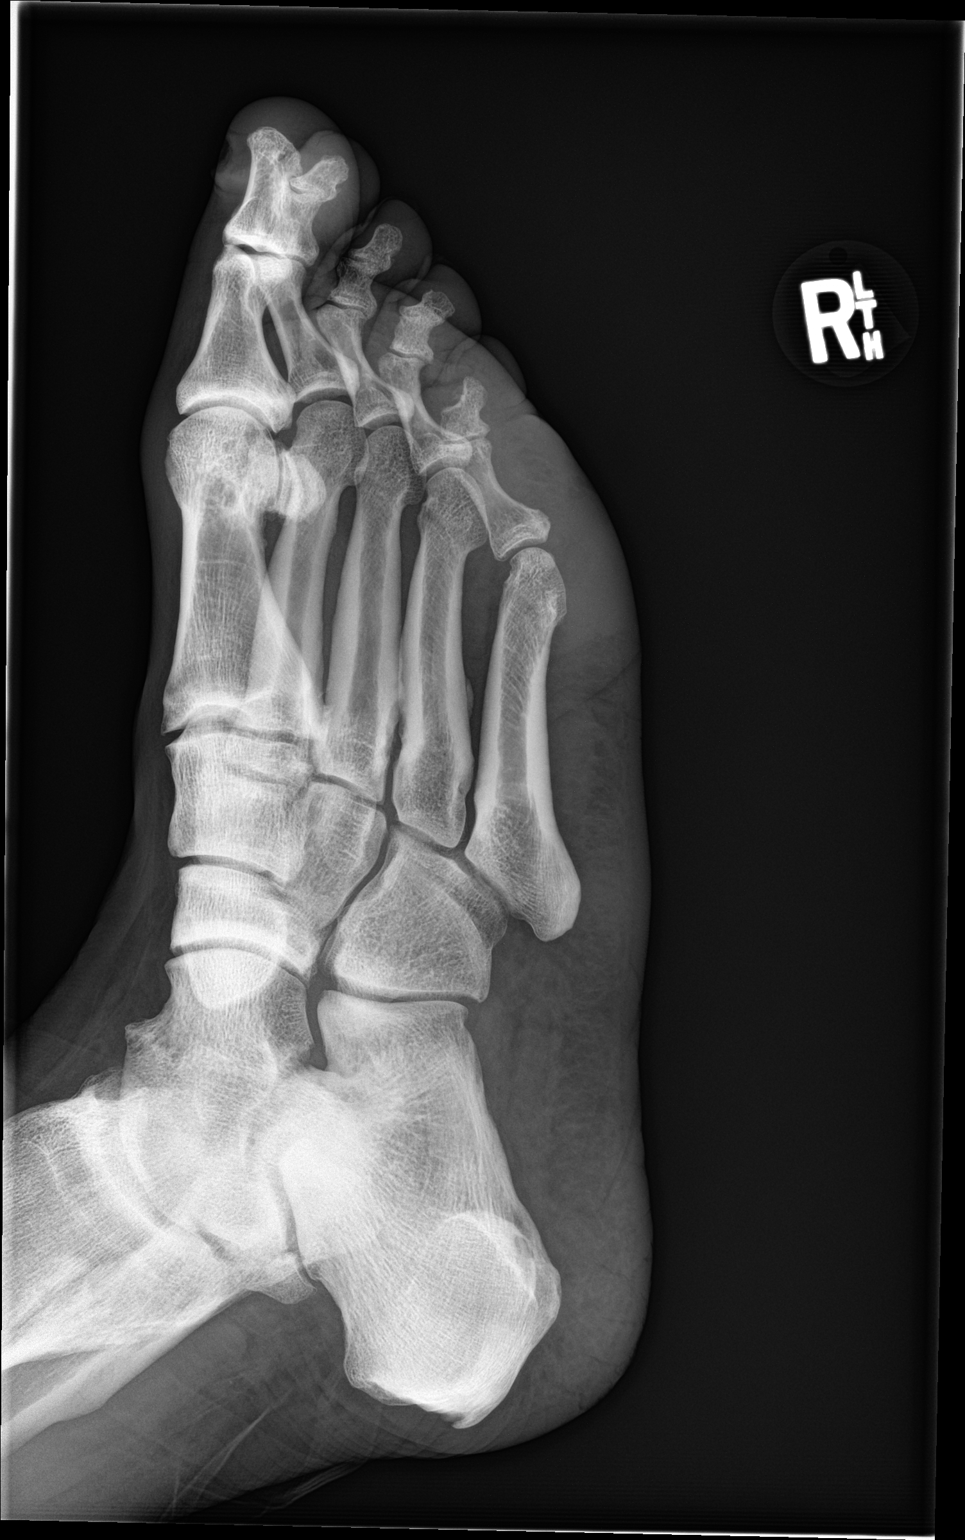

[foot lat]
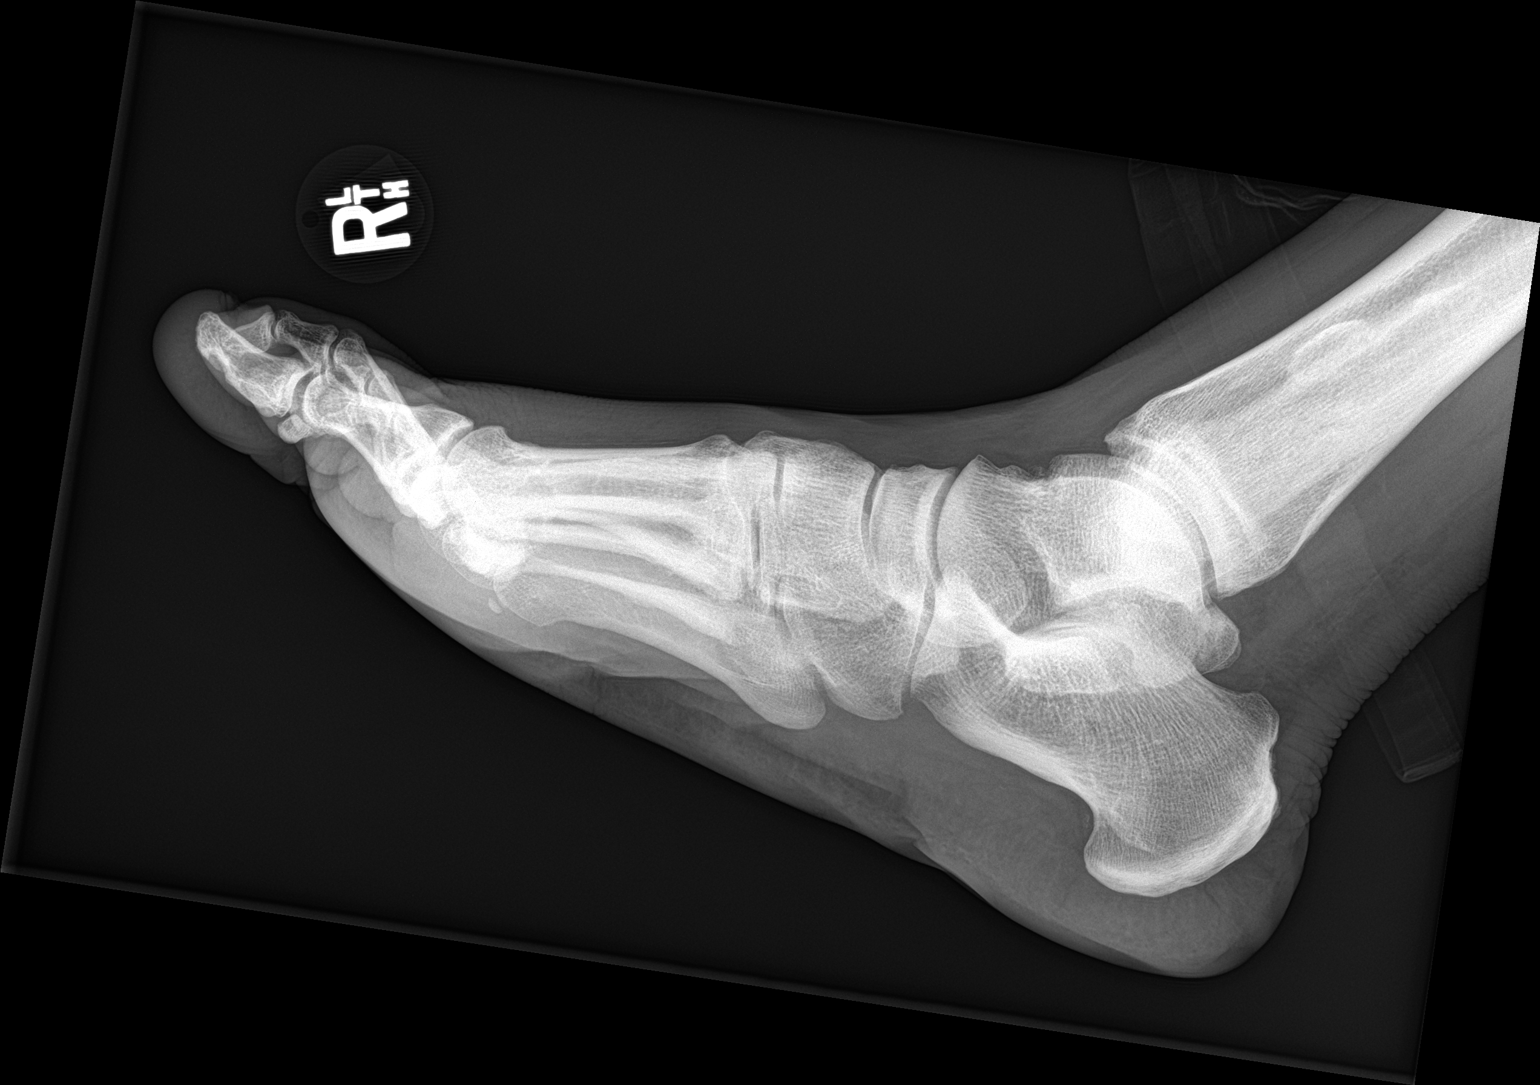

[3 of 3 positions shown; findings below may reference images not displayed]

FINDINGS: Soft tissue thickening and skin irregularity about the fifth digit
metatarsal phalangeal joint. Small focus with decreased bone mineral
density of the little toe proximal phalanx laterally seen only on
the AP view. There is otherwise no radiographic evidence of
osteomyelitis or bony destructive change. Osteoarthritis of first
metatarsal phalangeal joint is similar to prior. No radiopaque
foreign body.
IMPRESSION: Soft tissue thickening and skin irregularity about the fifth digit
metatarsal phalangeal joint. Small focus of decreased bone density
of the little toe proximal phalanx in the region of skin thickening
seen only on a single view, may reflect early osteomyelitis.

## 2019-03-23 ENCOUNTER — Emergency Department (HOSPITAL_COMMUNITY)
Admission: EM | Admit: 2019-03-23 | Discharge: 2019-03-23 | Disposition: A | Discharge status: Home / Self Care | Payer: Self-pay | Attending: Emergency Medicine | Admitting: Emergency Medicine

## 2019-03-23 ENCOUNTER — Other Ambulatory Visit: Payer: Self-pay

## 2019-03-23 ENCOUNTER — Encounter (HOSPITAL_COMMUNITY): Payer: Self-pay | Admitting: Emergency Medicine

## 2019-03-23 DIAGNOSIS — E119 Type 2 diabetes mellitus without complications: Secondary | ICD-10-CM | POA: Insufficient documentation

## 2019-03-23 DIAGNOSIS — Z79899 Other long term (current) drug therapy: Secondary | ICD-10-CM | POA: Insufficient documentation

## 2019-03-23 DIAGNOSIS — R600 Localized edema: Secondary | ICD-10-CM | POA: Insufficient documentation

## 2019-03-23 DIAGNOSIS — M79661 Pain in right lower leg: Secondary | ICD-10-CM | POA: Insufficient documentation

## 2019-03-23 DIAGNOSIS — I10 Essential (primary) hypertension: Secondary | ICD-10-CM | POA: Insufficient documentation

## 2019-03-23 DIAGNOSIS — R200 Anesthesia of skin: Secondary | ICD-10-CM | POA: Insufficient documentation

## 2019-03-23 DIAGNOSIS — Z7984 Long term (current) use of oral hypoglycemic drugs: Secondary | ICD-10-CM | POA: Insufficient documentation

## 2019-03-23 MED ORDER — IBUPROFEN 800 MG PO TABS: 800.0000 mg | ORAL_TABLET | Freq: Three times a day (TID) | ORAL | 0 refills | Status: DC

## 2019-03-23 MED ORDER — METHOCARBAMOL 500 MG PO TABS: 500.0000 mg | ORAL_TABLET | Freq: Two times a day (BID) | ORAL | 0 refills | Status: DC

## 2019-03-23 NOTE — Discharge Instructions (Signed)
As discussed, the numbness/tingling of your right foot could be related to uncontrolled diabetes. Call on Monday to schedule an appointment with your diabetic doctor for further evaluation. Your calf tightness is most likely due to muscle strain or muscle spasms. I am sending you home with ibuprofen and a muscle relaxer. Take as prescribed. Muscle relaxer can cause drowsiness, so do not drive or operate machinery while on the medication. I have included the number of [facility] if you are unable to get an appointment with your diabetes doctor. Return to the ER for new or worsening symptoms.

## 2019-03-23 NOTE — ED Provider Notes (Signed)
[facility] EMERGENCY DEPARTMENT Provider Note   CSN: [id number] Arrival date & time: 03/23/19  6269     History Chief Complaint  Patient presents with  . Foot Pain    tingling    Mike Bowen is a 39 y.o. male with a past medical history significant for insulin-dependent diabetes and hypertension who presents to the ED due to gradual onset and constant right foot numbness/tingling that started this morning.  Patient states the bottom of his foot feels like "pins-and-needles".  He also endorses intermittent calf tightness worse with ambulation of his right leg.  Patient denies direct injury to his right foot and right leg.  Patient denies history of blood clots, hormone use, recent surgeries, history of cancer, and recent long immobilizations.  Patient denies low back pain.  Patient notes his glucose typically runs in the 200-300s, but he hasn't seen his doctor since last year.  Chart reviewed.  Patient was seen in the ED in 2018 for an ulcer of his right foot.  Patient denies new ulcers. Patient denies chest pain and shortness of breath.  Patient denies saddle paresthesias, bowel/bladder incontinence, lower extremity weakness, bilateral lower extremity numbness/tingling, and IV drug use. He has not tried anything for his symptoms prior to arrival.      Past Medical History:  Diagnosis Date  . Diabetes mellitus without complication ([place])   . Hypertension     There are no problems to display for this patient.   Past Surgical History:  Procedure Laterality Date  . SHOULDER SURGERY         Family History  Problem Relation Age of Onset  . Diabetes Other     Social History   Tobacco Use  . Smoking status: Never Smoker  . Smokeless tobacco: Never Used  Substance Use Topics  . Alcohol use: Yes  . Drug use: No    Home Medications Prior to Admission medications   Medication Sig Start Date End Date Taking? Authorizing Provider  amoxicillin-clavulanate  (AUGMENTIN) 875-125 MG tablet Take 1 tablet by mouth every 12 (twelve) hours. 12/26/16   Rolland Porter, MD  cyclobenzaprine (FLEXERIL) 10 MG tablet Take 1 tablet (10 mg total) by mouth 3 (three) times daily as needed. Patient not taking: Reported on 04/01/2016 12/24/15   Kem Parkinson, PA-C  doxycycline (VIBRAMYCIN) 100 MG capsule Take 1 capsule (100 mg total) by mouth 2 (two) times daily. 08/24/16   Margarita Mail, PA-C  HYDROcodone-acetaminophen (NORCO/VICODIN) 5-325 MG tablet Take 1 tablet by mouth every 4 (four) hours as needed. Patient not taking: Reported on 04/01/2016 01/07/16   Evalee Jefferson, PA-C  ibuprofen (ADVIL) 800 MG tablet Take 1 tablet (800 mg total) by mouth 3 (three) times daily. 03/23/19   Suzy Bouchard, PA-C  ibuprofen (ADVIL,MOTRIN) 800 MG tablet Take 1 tablet (800 mg total) by mouth 3 (three) times daily. Patient not taking: Reported on 08/24/2016 12/24/15   Kem Parkinson, PA-C  ketoconazole (NIZORAL) 2 % cream Apply 1 application topically daily. Apply to the feet and in between the toes once a day. 08/24/16   Margarita Mail, PA-C  loperamide (IMODIUM) 2 MG capsule Take 1 capsule (2 mg total) by mouth 4 (four) times daily as needed for diarrhea or loose stools. Patient not taking: Reported on 08/24/2016 08/17/16   Horton, Barbette Hair, MD  metFORMIN (GLUCOPHAGE) 500 MG tablet Take 1 tablet (500 mg total) by mouth 2 (two) times daily with a meal. Patient taking differently: Take 1,000 mg by  mouth 2 (two) times daily with a meal.  06/25/16 08/24/16  Shaune Pollack, MD  methocarbamol (ROBAXIN) 500 MG tablet Take 1 tablet (500 mg total) by mouth 2 (two) times daily. 03/23/19   Mannie Stabile, PA-C  ondansetron (ZOFRAN ODT) 4 MG disintegrating tablet Take 1 tablet (4 mg total) by mouth every 8 (eight) hours as needed for nausea or vomiting. Patient not taking: Reported on 08/24/2016 08/17/16   Horton, Mayer Masker, MD  promethazine (PHENERGAN) 25 MG tablet Take 1 tablet (25 mg total) by  mouth every 6 (six) hours as needed. Patient not taking: Reported on 08/24/2016 04/01/16   Donnetta Hutching, MD    Allergies    Patient has no known allergies.  Review of Systems   Review of Systems  Constitutional: Negative for chills and fever.  Respiratory: Negative for shortness of breath.   Cardiovascular: Negative for chest pain and leg swelling.  Gastrointestinal: Negative for abdominal pain, diarrhea, nausea and vomiting.  Musculoskeletal: Positive for myalgias (right calf tightness).  Neurological: Positive for numbness (right foot numbness/tingling). Negative for weakness.    Physical Exam Updated Vital Signs BP (!) 178/98 (BP Location: Left Arm)   Pulse 83   Temp 98 F (36.7 C) (Oral)   Resp 16   Ht 6' (1.829 m)   Wt 111.1 kg   SpO2 98%   BMI 33.23 kg/m   Physical Exam Vitals and nursing note reviewed.  Constitutional:      General: He is not in acute distress.    Appearance: He is not ill-appearing.  HENT:     Head: Normocephalic.  Eyes:     Pupils: Pupils are equal, round, and reactive to light.  Cardiovascular:     Rate and Rhythm: Normal rate and regular rhythm.     Pulses: Normal pulses.     Heart sounds: Normal heart sounds. No murmur. No friction rub. No gallop.   Pulmonary:     Effort: Pulmonary effort is normal.     Breath sounds: Normal breath sounds.  Abdominal:     General: Abdomen is flat. There is no distension.     Palpations: Abdomen is soft.     Tenderness: There is no abdominal tenderness. There is no guarding or rebound.  Musculoskeletal:     Cervical back: Neck supple.     Right lower leg: Edema present.     Left lower leg: Edema present.     Comments: 1+ pitting edema of lower extremities bilaterally (left > right). 5/5 strength of bilateral lower extremities. Distal pulses and sensation intact. No lesions or ulcers on right foot. No tenderness to palpation of either calf. No erythema or warmth of right calf. Negative homans sign  bilaterally. Patient able to ambulate in the ED without difficulty.   Skin:    General: Skin is warm and dry.  Neurological:     General: No focal deficit present.     Mental Status: He is alert.  Psychiatric:        Mood and Affect: Mood normal.        Behavior: Behavior normal.     ED Results / Procedures / Treatments   Labs (all labs ordered are listed, but only abnormal results are displayed) Labs Reviewed - No data to display  EKG None  Radiology No results found.  Procedures Procedures (including critical care time)  Medications Ordered in ED Medications - No data to display  ED Course  I have reviewed the triage vital signs  and the nursing notes.  Pertinent labs & imaging results that were available during my care of the patient were reviewed by me and considered in my medical decision making (see chart for details).    MDM Rules/Calculators/A&P                     39 year old male presents to the ED due to right foot numbness/tingling associated with right calf tightness.  Patient has a history of insulin-dependent diabetes.  Patient has not seen his doctor since last year and notes his glucose typically runs in the 200-300 range.  Patient denies history of diabetic neuropathy.  No history of blood clots, recent surgeries, recent immobilizations, or hormone use.  No low back pain.  Stable vitals.  Patient no acute distress and non-ill-appearing.  Physical exam reassuring.  Lower extremities neurovascularly intact bilaterally.  No lesions or ulcers on right or left foot.  Patient able to ambulate in the ED without difficulty.  1+ pitting edema bilaterally.  No calf tenderness.  Negative Homans sign bilaterally.  Suspect right foot numbness/tingling is related to diabetic neuropathy given patient's glucose has not been well controlled.  Suspect calf tightness is related to muscle strain vs muscle spasms.  PERC negative and low risk using Wells criteria.  Doubt PE/DVT.   Patient denies shortness of breath and chest pain.  Will discharge patient with ibuprofen and a muscle relaxer.  Advised patient that muscle relaxer can cause drowsiness and do not drive or operate machine around the medication.  Shared decision making in regards to x-ray of right foot which patient does not feel is needed at this time given there was no injury.  Advised patient to call Monday to schedule an appointment with his diabetic doctor to reevaluate and control his diabetes. [facility] wellness number given to patient if he is unable to get an appointment with his doctor. Strict ED precautions discussed with patient. Patient states understanding and agrees to plan. Patient discharged home in no acute distress and stable vitals  Final Clinical Impression(s) / ED Diagnoses Final diagnoses:  Numbness of right foot  Right calf pain    Rx / DC Orders ED Discharge Orders         Ordered    ibuprofen (ADVIL) 800 MG tablet  3 times daily     03/23/19 1015    methocarbamol (ROBAXIN) 500 MG tablet  2 times daily     03/23/19 1015           Jesusita Oka 03/23/19 1019    Jacalyn Lefevre, MD 03/23/19 1059

## 2019-03-23 NOTE — ED Triage Notes (Signed)
Pt. Stated, zi woke up this morning and my rt. Foot was tingling this morning and my calf is hurting.

## 2019-06-03 ENCOUNTER — Other Ambulatory Visit: Payer: Self-pay

## 2019-06-03 ENCOUNTER — Encounter (HOSPITAL_BASED_OUTPATIENT_CLINIC_OR_DEPARTMENT_OTHER): Payer: Self-pay | Attending: Internal Medicine | Admitting: Internal Medicine

## 2019-06-03 DIAGNOSIS — E1142 Type 2 diabetes mellitus with diabetic polyneuropathy: Secondary | ICD-10-CM | POA: Insufficient documentation

## 2019-06-03 DIAGNOSIS — L97512 Non-pressure chronic ulcer of other part of right foot with fat layer exposed: Secondary | ICD-10-CM | POA: Insufficient documentation

## 2019-06-03 DIAGNOSIS — E11621 Type 2 diabetes mellitus with foot ulcer: Secondary | ICD-10-CM | POA: Insufficient documentation

## 2019-06-03 DIAGNOSIS — I10 Essential (primary) hypertension: Secondary | ICD-10-CM | POA: Insufficient documentation

## 2019-06-03 DIAGNOSIS — Z7984 Long term (current) use of oral hypoglycemic drugs: Secondary | ICD-10-CM | POA: Insufficient documentation

## 2019-06-10 ENCOUNTER — Encounter (HOSPITAL_BASED_OUTPATIENT_CLINIC_OR_DEPARTMENT_OTHER): Payer: Self-pay | Admitting: Internal Medicine

## 2019-06-11 NOTE — Progress Notes (Signed)
Pinch, Kace L. ([id number]) Visit Report for 06/03/2019 Chief Complaint Document Details Patient Name: Date of Service: Mike Bowen, Mike Bowen 06/03/2019 9:00 AM Medical Record [id number] Patient Account Number: [id number] Date of Birth/Sex: Treating RN: [birth year] (38 y.o. Jerilynn Mages) Carlene Coria Primary Care Provider: PATIENT, NO Other Clinician: Referring Provider: Treating Provider/Extender:Jamisha Hoeschen, Anson Crofts, Designated Weeks in Treatment: 0 Information Obtained from: Patient Chief Complaint 09/01/16; referred from the emergency room for review of a wound on his right lateral fifth metatarsal head 12/28/16; discharge from clinic 2 weeks ago readmitted with a wound on the right fifth metatarsal head and the same spot 06/03/2019; patient returns to clinic with a wound on his right lateral foot just proximal to the base of the fifth metatarsal Electronic Signature(s) Signed: 06/03/2019 6:13:52 PM By: Linton Ham MD Entered By: Linton Ham on 06/03/2019 10:58:16 -------------------------------------------------------------------------------- Debridement Details Patient Name: Date of Service: Mike Bowen 06/03/2019 9:00 AM Medical Record [id number] Patient Account Number: [id number] Date of Birth/Sex: Treating RN: 1980/04/23 (38 y.o. Jerilynn Mages) Carlene Coria Primary Care Provider: PATIENT, NO Other Clinician: Referring Provider: Treating Provider/Extender:Dabney Schanz, Anson Crofts, Designated Weeks in Treatment: 0 Debridement Performed for Wound #3 Right Foot Assessment: Performed By: Physician Ricard Dillon., MD Debridement Type: Debridement Severity of Tissue Pre Fat layer exposed Debridement: Level of Consciousness (Pre- Awake and Alert procedure): Pre-procedure Verification/Time Out Taken: Yes - 10:48 Start Time: 10:48 Total Area Debrided (L x W): 1.6 (cm) x 0.8 (cm) = 1.28 (cm) Tissue and other material Viable, Non-Viable, Subcutaneous, Skin: Dermis , Skin:  Epidermis debrided: Level: Skin/Subcutaneous Tissue Debridement Description: Excisional Instrument: Curette Bleeding: Moderate Hemostasis Achieved: Pressure End Time: 10:51 Procedural Pain: 0 Post Procedural Pain: 0 Response to Treatment: Procedure was tolerated well Level of Consciousness Awake and Alert (Post-procedure): Post Debridement Measurements of Total Wound Length: (cm) 1.6 Width: (cm) 0.8 Depth: (cm) 0.1 Volume: (cm) 0.101 Character of Wound/Ulcer Post Improved Debridement: Severity of Tissue Post Debridement: Fat layer exposed Post Procedure Diagnosis Same as Pre-procedure Electronic Signature(s) Signed: 06/03/2019 6:13:52 PM By: Linton Ham MD Signed: 06/03/2019 6:43:44 PM By: Carlene Coria RN Entered By: Linton Ham on 06/03/2019 10:57:45 -------------------------------------------------------------------------------- HPI Details Patient Name: Date of Service: Mike Bowen 06/03/2019 9:00 AM Medical Record [id number] Patient Account Number: [id number] Date of Birth/Sex: Treating RN: 11/05/1980 (38 y.o. Jerilynn Mages) Carlene Coria Primary Care Provider: PATIENT, NO Other Clinician: Referring Provider: Treating Provider/Extender:Alizaya Oshea, Anson Crofts, Designated Weeks in Treatment: 0 History of Present Illness HPI Description: 09/01/16; this is a 39 year old man with type 2 diabetes on oral agents. It is probable that his diabetes is not under good control although there has not been a recent hemoglobin A1c. He was in the ER on 06/25/16 with a glucose of 418. He does not have primary care. He is a nonsmoker. He has no known history of diabetic PAD or neuropathy. The patient states that 2 weeks ago he noted a blister on his right lateral fifth metatarsal head. This became erythematous and painful. He was seen in the ER on 08/24/16 and noted to have an ulcer. Culture of this grew methicillin sensitive staph aureus and he is on 10 days of doxycycline starting on  6/30. X-ray at the time was negative for bony abnormality. He does not have a prior history of diabetic wounds. He has not been systemically unwell. Lab work on 6/28 showed a white count of 10 with 80% neutrophils. Comprehensive metabolic panel was normal other than an albumin of 3.4. BUN and creatinine were normal CO2 was 28.  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 Once again significant tunneling inferiorly. Surrounding tissue is thick and almost the plastic-like consistency. Extensive debridement done. No obvious infection is seen in the area. 09/15/16; difficult wound on the lateral aspect of the right fifth metatarsal head. There is some tunneling to this inferiorly. Aggressive debridement I did last week seems to of left a healthy granulated wound bed. There is no obvious infection. We have been using silver collagen. 09/22/16; Arrives with wound no better. Surrounding callous and non viable tissue. silver collagen 09/29/16 on evaluation today patient presents for evaluation of his right fifth metatarsal head ulcer. Fortunately there's no evidence of erythema surrounding infection and there is no purulent discharge at this point. Patient tells me that he is having no pain currently. He has no nausea, vomiting, diarrhea that would indicate a systemic infection. Overall things seem to be doing well although he had not been checking his blood sugars and therefore he cannot tell me how these have been running due to the fact that he had not yet gotten strips. Being out of work secondary to this ulcer has really caused him a lot of financial hardship. 10/06/2016 -- he has brought some papers from his workplace regarding disability and I have signed off on these today 10/20/16; he had Hydrofera Blue prescribed last week however he didn't  have this one reason or another and he's been putting silver collagen on this. He arrives in clinic today extremely hypertensive. I had given him blood pressure medication myself about a month ago which he appears to be taking intermittently by his own admission. We also suggested primary physician's and gave him a list of options he doesn't seem to a followed through with any of this 10/27/16; using silver collagen. Once again the circumference of the wound appears macerated. Requires debridement. I'll change to silver alginate. 11/03/16; the patient is using silver alginate as of last week. Small wound with healthy granulation. He has run out of his blood pressure medications although his blood pressure was 141/90 today. 11/17/16; patient is using silver alginate. Patient arrives today with smaller looking wound although was some depth. He had an appointment with his primary care but had to cancel it last week 11/24/16; wound on the right lateral fifth metatarsal head. Small open area last week that I used Iodosorb ointment 4 with a hope of closing this down. He arrives today with a small open wound but with a considerable degree of undermining. Post debridement change to [facility] 12/08/16; the patient arrives today with a wound on the right lateral fifth metatarsal head in a healed state. When he was last here I debrided the undermining here to a healthy surface and he seems to of healed over in the last 2 weeks. There is a nice viable surface here. He is concerned about his job vis--vis active on his feet. I advised him to keep this area protected in issues. Initially this started as a blister that became secondarily infected or infection may have been the primary issue am not really certain. He is a type II diabetic READMISSION 12/28/16; I heal this patient out a little over 2 weeks ago. He states that the wound reopened on 12/24/16. He was in the ER yesterday. His lab work was reasonably  unremarkable other than a glucose of  over 300. A culture of the wound was done. X-ray showed suggestion of early osteomyelitis about the fifth digit proximal phalanx which might represent early osteomyelitis. I believe he was given IV antibiotics and discharged on Augmentin twice a day for 2 weeks. A culture was done that is pending. He tells me he is only on metformin for his diabetes. He has poorly controlled hypertension which we also noted when he was in the clinic last time he has an appointment with primary care on 01/10/17. ABI was 1.29 in this clinic last time He had a mildly elevated C-reactive protein his sedimentation rate was only in the 20s white count was normal 01/05/17; the wound itself does not look ominous today. Using silver alginate. Culture I did last week showed no growth although he did have group B strep in the ER. He is completing the Augmentin they prescribed. He has not yet had a call for his MRI 01/12/17; the patient's MRI essentially showed the same thing as his x-ray that is a soft tissue ulcer over the lateral aspect of the fifth MTP with surrounding cellulitis and a small area of cortical irregularity along the lateral base of the fifth proximal phalanx with mild marrow edema and minimal enhancement suggesting a small focus of osteomyelitis versus reactive marrow changes secondary to adjacent inflammation. A small focus of osteomyelitis was favored. The patient is completing 2 weeks of Augmentin this will need to be extended. he is using silver alginate on the wound 01/26/18; patient is picked up his final 2 weeks of Augmentin. Wound is smaller and dimensions down. Still using silver alginate 02/02/17; 2 final weeks of Augmentin which is different from what I stated last week. He says he has one more refill. Would be reasonable to complete this. He started the Augmentin at the beginning of November when he was in the emergency room I continue it for a total of 6  weeks. His wound is basically closed over at this point although I am not completely sure of the viability of the surface and I would like to continue to dress this for a further week at least The patient stubbed his toe while playing with his children he has a small open area on the tip of the fifth toeon the right. This is small and superficial 02/09/17 right 5th met head. This was a re-occurrence after being healed once. I treated him with oral Augmenting for a small area of underlying osteomyellits He is completing this.His wound is closed READMISSION 06/03/2019 This is a now 39 year old man with type 2 diabetes and a history of peripheral neuropathy. We had them here on 2 occasions in 2018 with a wound on the lateral part of the fifth metatarsal head. He was here for a period of time from July to October 2018 and was healed out however he rapidly broke down. Was discovered to have osteomyelitis in this area I gave him empiric Augmentin this close note closed over and he has not had trouble in this area since. He says roughly 2 months ago at work he got his feet wet and he developed a blister and since then he has had a wound on the right lateral foot he has been using Neosporin on this. 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 Past medical history; type 2 diabetes with peripheral neuropathy, hypertension and a history of atrial fibrillation. He thinks his last  hemoglobin A1c was in the sevens range. His ABI in our Clinic was noncompressible Electronic Signature(s) Signed: 06/03/2019 6:13:52 PM By: Linton Ham MD Entered By: Linton Ham on 06/03/2019 11:00:27 -------------------------------------------------------------------------------- Physical Exam Details Patient Name: Date of Service: Mike Bowen 06/03/2019 9:00 AM Medical Record [id number] Patient Account Number:  [id number] Date of Birth/Sex: Treating RN: 08/08/1980 (38 y.o. Jerilynn Mages) Carlene Coria Primary Care Provider: PATIENT, NO Other Clinician: Referring Provider: Treating Provider/Extender:Keirstan Iannello, Anson Crofts, Designated Weeks in Treatment: 0 Constitutional Pulse regular and within target range for patient.Marland Kitchen Respirations regular, non-labored and within target range.. Temperature is normal and within the target range for the patient.Marland Kitchen Appears in no distress. Eyes Conjunctivae clear. No discharge.no icterus. Respiratory work of breathing is normal. Bilateral breath sounds are clear and equal in all lobes with no wheezes, rales or rhonchi.. Cardiovascular Heart rhythm and rate regular, without murmur or gallop.. Needle pulses are vibrant. Integumentary (Hair, Skin) No evidence of infection around the wound on the right lateral foot. Neurological He did pretty well on the monofilament test he has slightly reduced vibration sense in the right foot normalizes at the ankle. Psychiatric appears at normal baseline. Notes Wound exam; the areas on the right lateral foot but tends to be more on the plantar side. Just proximal to the base of the fifth metatarsal. The wound has rolled edges. Surface does not look too bad. Using a #5 curette I removed skin and subcutaneous tissue from the wound circumference hemostasis with direct pressure. There is no exposed bone Electronic Signature(s) Signed: 06/03/2019 6:13:52 PM By: Linton Ham MD Entered By: Linton Ham on 06/03/2019 11:01:58 -------------------------------------------------------------------------------- Physician Orders Details Patient Name: Date of Service: Mike Bowen 06/03/2019 9:00 AM Medical Record [id number] Patient Account Number: [id number] Date of Birth/Sex: Treating RN: [birth year] (38 y.o. Jerilynn Mages) Carlene Coria Primary Care Provider: PATIENT, NO Other Clinician: Referring Provider: Treating Provider/Extender:Nox Talent,  Anson Crofts, Designated Weeks in Treatment: 0 Verbal / Phone Orders: No Diagnosis Coding Follow-up Appointments Return Appointment in 1 week. Dressing Change Frequency Other: - 2 times per week Wound Cleansing May shower and wash wound with soap and water. - on days dressing ordered to be changed , all others shower with protection, area can not get wet on those days Primary Wound Dressing Silver Collagen - moisten with hydrogel Secondary Dressing Foam Border - area to be padded with gauze on days patient works Engineer, maintenance) Signed: 06/03/2019 6:13:52 PM By: Linton Ham MD Signed: 06/03/2019 6:43:44 PM By: Carlene Coria RN Entered By: Carlene Coria on 06/03/2019 10:53:52 -------------------------------------------------------------------------------- Problem List Details Patient Name: Date of Service: Mike Bowen 06/03/2019 9:00 AM Medical Record [id number] Patient Account Number: [id number] Date of Birth/Sex: Treating RN: 1981-01-14 (38 y.o. Jerilynn Mages) Dolores Lory, [place] Primary Care Provider: PATIENT, NO Other Clinician: Referring Provider: Treating Provider/Extender:Corrie Brannen, Anson Crofts, Designated Weeks in Treatment: 0 Active Problems ICD-10 Evaluated Encounter Code Description Active Date Today Diagnosis E11.621 Type 2 diabetes mellitus with foot ulcer 06/03/2019 No Yes L97.512 Non-pressure chronic ulcer of other part of right foot 06/03/2019 No Yes with fat layer exposed E11.42 Type 2 diabetes mellitus with diabetic polyneuropathy 06/03/2019 No Yes Inactive Problems Resolved Problems Electronic Signature(s) Signed: 06/03/2019 6:13:52 PM By: Linton Ham MD Entered By: Linton Ham on 06/03/2019 10:57:20 -------------------------------------------------------------------------------- Progress Note Details Patient Name: Date of Service: Mike Bowen 06/03/2019 9:00 AM Medical Record [id number] Patient Account Number: [id number] Date of  Birth/Sex: Treating RN: 1980/11/21 (38 y.o. Oval Linsey Primary Care Provider: PATIENT, NO Other Clinician: Referring Provider:  Treating Provider/Extender:Joreen Swearingin, Legrand Como None, Designated Weeks in Treatment: 0 Subjective Chief Complaint Information obtained from Patient 09/01/16; referred from the emergency room for review of a wound on his right lateral fifth metatarsal head 12/28/16; discharge from clinic 2 weeks ago readmitted with a wound on the right fifth metatarsal head and the same spot 06/03/2019; patient returns to clinic with a wound on his right lateral foot just proximal to the base of the fifth metatarsal History of Present Illness (HPI) 09/01/16; this is a 39 year old man with type 2 diabetes on oral agents. It is probable that his diabetes is not under good control although there has not been a recent hemoglobin A1c. He was in the ER on 06/25/16 with a glucose of 418. He does not have primary care. He is a nonsmoker. He has no known history of diabetic PAD or neuropathy. The patient states that 2 weeks ago he noted a blister on his right lateral fifth metatarsal head. This became erythematous and painful. He was seen in the ER on 08/24/16 and noted to have an ulcer. Culture of this grew methicillin sensitive staph aureus and he is on 10 days of doxycycline starting on 6/30. X-ray at the time was negative for bony abnormality. He does not have a prior history of diabetic wounds. He has not been systemically unwell. Lab work on 6/28 showed a white count of 10 with 80% neutrophils. Comprehensive metabolic panel was normal other than an albumin of 3.4. BUN and creatinine were normal CO2 was 28. 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 Once again significant tunneling inferiorly. Surrounding tissue is thick and almost the  plastic-like consistency. Extensive debridement done. No obvious infection is seen in the area. 09/15/16; difficult wound on the lateral aspect of the right fifth metatarsal head. There is some tunneling to this inferiorly. Aggressive debridement I did last week seems to of left a healthy granulated wound bed. There is no obvious infection. We have been using silver collagen. 09/22/16; Arrives with wound no better. Surrounding callous and non viable tissue. silver collagen 09/29/16 on evaluation today patient presents for evaluation of his right fifth metatarsal head ulcer. Fortunately there's no evidence of erythema surrounding infection and there is no purulent discharge at this point. Patient tells me that he is having no pain currently. He has no nausea, vomiting, diarrhea that would indicate a systemic infection. Overall things seem to be doing well although he had not been checking his blood sugars and therefore he cannot tell me how these have been running due to the fact that he had not yet gotten strips. Being out of work secondary to this ulcer has really caused him a lot of financial hardship. 10/06/2016 -- he has brought some papers from his workplace regarding disability and I have signed off on these today 10/20/16; he had Hydrofera Blue prescribed last week however he didn't have this one reason or another and he's been putting silver collagen on this. He arrives in clinic today extremely hypertensive. I had given him blood pressure medication myself about a month ago which he appears to be taking intermittently by his own admission. We also suggested primary physician's and gave him a list of options he doesn't seem to a followed through with any of this 10/27/16; using silver collagen. Once again the circumference of the wound appears  macerated. Requires debridement. I'll change to silver alginate. 11/03/16; the patient is using silver alginate as of last week. Small wound with healthy  granulation. He has run out of his blood pressure medications although his blood pressure was 141/90 today. 11/17/16; patient is using silver alginate. Patient arrives today with smaller looking wound although was some depth. He had an appointment with his primary care but had to cancel it last week 11/24/16; wound on the right lateral fifth metatarsal head. Small open area last week that I used Iodosorb ointment 4 with a hope of closing this down. He arrives today with a small open wound but with a considerable degree of undermining. Post debridement change to [facility] 12/08/16; the patient arrives today with a wound on the right lateral fifth metatarsal head in a healed state. When he was last here I debrided the undermining here to a healthy surface and he seems to of healed over in the last 2 weeks. There is a nice viable surface here. He is concerned about his job vis--vis active on his feet. I advised him to keep this area protected in issues. Initially this started as a blister that became secondarily infected or infection may have been the primary issue am not really certain. He is a type II diabetic READMISSION 12/28/16; I heal this patient out a little over 2 weeks ago. He states that the wound reopened on 12/24/16. He was in the ER yesterday. His lab work was reasonably unremarkable other than a glucose of over 300. A culture of the wound was done. X-ray showed suggestion of early osteomyelitis about the fifth digit proximal phalanx which might represent early osteomyelitis. I believe he was given IV antibiotics and discharged on Augmentin twice a day for 2 weeks. A culture was done that is pending. He tells me he is only on metformin for his diabetes. He has poorly controlled hypertension which we also noted when he was in the clinic last time he has an appointment with primary care on 01/10/17. ABI was 1.29 in this clinic last time He had a mildly elevated C-reactive protein his  sedimentation rate was only in the 20s white count was normal 01/05/17; the wound itself does not look ominous today. Using silver alginate. Culture I did last week showed no growth although he did have group B strep in the ER. He is completing the Augmentin they prescribed. He has not yet had a call for his MRI 01/12/17; the patient's MRI essentially showed the same thing as his x-ray that is a soft tissue ulcer over the lateral aspect of the fifth MTP with surrounding cellulitis and a small area of cortical irregularity along the lateral base of the fifth proximal phalanx with mild marrow edema and minimal enhancement suggesting a small focus of osteomyelitis versus reactive marrow changes secondary to adjacent inflammation. A small focus of osteomyelitis was favored. The patient is completing 2 weeks of Augmentin this will need to be extended. he is using silver alginate on the wound 01/26/18; patient is picked up his final 2 weeks of Augmentin. Wound is smaller and dimensions down. Still using silver alginate 02/02/17; 2 final weeks of Augmentin which is different from what I stated last week. He says he has one more refill. Would be reasonable to complete this. He started the Augmentin at the beginning of November when he was in the emergency room I continue it for a total of 6 weeks. His wound is basically closed over at this point  although I am not completely sure of the viability of the surface and I would like to continue to dress this for a further week at least The patient stubbed his toe while playing with his children he has a small open area on the tip of the fifth toeon the right. This is small and superficial 02/09/17 right 5th met head. This was a re-occurrence after being healed once. I treated him with oral Augmenting for a small area of underlying osteomyellits He is completing this.His wound is closed READMISSION 06/03/2019 This is a now 39 year old man with type 2 diabetes and  a history of peripheral neuropathy. We had them here on 2 occasions in 2018 with a wound on the lateral part of the fifth metatarsal head. He was here for a period of time from July to October 2018 and was healed out however he rapidly broke down. Was discovered to have osteomyelitis in this area I gave him empiric Augmentin this close note closed over and he has not had trouble in this area since. He says roughly 2 months ago at work he got his feet wet and he developed a blister and since then he has had a wound on the right lateral foot he has been using Neosporin on this. 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 Past medical history; type 2 diabetes with peripheral neuropathy, hypertension and a history of atrial fibrillation. He thinks his last hemoglobin A1c was in the sevens range. His ABI in our Clinic was noncompressible Patient History Information obtained from Patient. Allergies No Known Allergies Family History Diabetes - Maternal Grandparents,Siblings, Hypertension - Mother, Thyroid Problems - aunt, No family history of Cancer, Heart Disease, Kidney Disease, Lung Disease, Seizures, Stroke, Tuberculosis. Social History Never smoker, Marital Status - Single, Alcohol Use - Moderate, Drug Use - Current History - marijuana, Caffeine Use - Daily - diet drinks. Medical History Cardiovascular Patient has history of Hypertension Endocrine Patient has history of Type II Diabetes Denies history of Type I Diabetes Integumentary (Skin) Denies history of History of Burn Musculoskeletal Denies history of Gout, Rheumatoid Arthritis, Osteoarthritis, Osteomyelitis Neurologic Patient has history of Neuropathy Oncologic Denies history of Received Chemotherapy, Received Radiation Psychiatric Denies history of Anorexia/bulimia, Confinement Anxiety Patient is treated with Insulin. Blood sugar is  tested. Blood sugar results noted at the following times: Breakfast - 115, Bedtime - 108. Hospitalization/Surgery History - Shoulder Surgery. Medical And Surgical History Notes Integumentary (Skin) Foot Ulcer Review of Systems (ROS) Constitutional Symptoms (General Health) Denies complaints or symptoms of Fatigue, Fever, Chills, Marked Weight Change. Eyes Denies complaints or symptoms of Dry Eyes, Vision Changes, Glasses / Contacts. Ear/Nose/Mouth/Throat Denies complaints or symptoms of Chronic sinus problems or rhinitis. Respiratory Denies complaints or symptoms of Chronic or frequent coughs, Shortness of Breath. Gastrointestinal Denies complaints or symptoms of Frequent diarrhea, Nausea, Vomiting. Genitourinary Denies complaints or symptoms of Frequent urination. Integumentary (Skin) Complains or has symptoms of Wounds - right foot. Musculoskeletal Denies complaints or symptoms of Muscle Pain, Muscle Weakness. Neurologic Complains or has symptoms of Numbness/parasthesias. Psychiatric Denies complaints or symptoms of Claustrophobia, Suicidal. Objective Constitutional Pulse regular and within target range for patient.Marland Kitchen Respirations regular, non-labored and within target range.. Temperature is normal and within the target range for the patient.Marland Kitchen Appears in no distress. Vitals Time Taken: 9:48 AM, Height: 72 in, Source: Stated, Weight: 256 lbs, Source: Stated, BMI: 34.7, Temperature: 98.6 F, Respiratory Rate:  18 breaths/min, Capillary Blood Glucose: 96 mg/dl. General Notes: glucose per pt report last night Eyes Conjunctivae clear. No discharge.no icterus. Respiratory work of breathing is normal. Bilateral breath sounds are clear and equal in all lobes with no wheezes, rales or rhonchi.. Cardiovascular Heart rhythm and rate regular, without murmur or gallop.. Needle pulses are vibrant. Neurological He did pretty well on the monofilament test he has slightly reduced vibration  sense in the right foot normalizes at the ankle. Psychiatric appears at normal baseline. General Notes: Wound exam; the areas on the right lateral foot but tends to be more on the plantar side. Just proximal to the base of the fifth metatarsal. The wound has rolled edges. Surface does not look too bad. Using a #5 curette I removed skin and subcutaneous tissue from the wound circumference hemostasis with direct pressure. There is no exposed bone Integumentary (Hair, Skin) No evidence of infection around the wound on the right lateral foot. Wound #3 status is Open. Original cause of wound was Blister. The wound is located on the Right Foot. The wound measures 1.6cm length x 0.8cm width x 0.1cm depth; 1.005cm^2 area and 0.101cm^3 volume. There is Fat Layer (Subcutaneous Tissue) Exposed exposed. There is no tunneling or undermining noted. There is a small amount of serous drainage noted. The wound margin is flat and intact. There is large (67-100%) red granulation within the wound bed. There is no necrotic tissue within the wound bed. 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 There was a Excisional Skin/Subcutaneous Tissue Debridement with a total area of 1.28 sq cm performed by Ricard Dillon., MD. With the following instrument(s): Curette to remove Viable and Non-Viable tissue/material. Material removed includes Subcutaneous Tissue, Skin: Dermis, and Skin: Epidermis. No specimens were taken. A time out was conducted at 10:48, prior to the start of the procedure. A Moderate amount of bleeding was controlled with Pressure. The procedure  was tolerated well with a pain level of 0 throughout and a pain level of 0 following the procedure. Post Debridement Measurements: 1.6cm length x 0.8cm width x 0.1cm depth; 0.101cm^3 volume. Character of Wound/Ulcer Post Debridement is improved. Severity of Tissue Post Debridement is: Fat layer exposed. Post procedure Diagnosis Wound #3: Same as Pre-Procedure Plan Follow-up Appointments: Return Appointment in 1 week. Dressing Change Frequency: Other: - 2 times per week Wound Cleansing: May shower and wash wound with soap and water. - on days dressing ordered to be changed , all others shower with protection, area can not get wet on those days Primary Wound Dressing: Silver Collagen - moisten with hydrogel Secondary Dressing: Foam Border - area to be padded with gauze on days patient works 1. Silver collagen moistened with hydrogel/foam and offloading with gauze especially when he is in his work boots. 2. His wounds have been on the outside of this foot including the one in 2018 although they are not in the same location. At this point I did not think he needed imaging although I may change my mind on this. 3. His ABIs were noncompressible but peripheral pulses were vibrant in this clinic I will not order her formal arterial studies as of yet 4. I think the  most difficult issue here will be offloading this area properly. I have talked to him about this. He does not have medical insurance making everything more complicated. Nevertheless I think he understands the issue. We will be giving him collagen that he can moistened with K-Y jelly. He will need to get the foam and gauze to offload this to have any chance of healing I spent 30 minutes in review of this patient's records including his past history here, face-to-face evaluation and preparation of this record Electronic Signature(s) Signed: 06/03/2019 6:13:52 PM By: Linton Ham MD Entered By: Linton Ham on 06/03/2019  11:03:47 -------------------------------------------------------------------------------- HxROS Details Patient Name: Date of Service: Mike Bowen 06/03/2019 9:00 AM Medical Record [id number] Patient Account Number: [id number] Date of Birth/Sex: Treating RN: [birth year] (38 y.o. Jerilynn Mages) Carlene Coria Primary Care Provider: PATIENT, NO Other Clinician: Referring Provider: Treating Provider/Extender:Aadan Chenier, Anson Crofts, Designated Weeks in Treatment: 0 Information Obtained From Patient Constitutional Symptoms (General Health) Complaints and Symptoms: Negative for: Fatigue; Fever; Chills; Marked Weight Change Eyes Complaints and Symptoms: Negative for: Dry Eyes; Vision Changes; Glasses / Contacts Ear/Nose/Mouth/Throat Complaints and Symptoms: Negative for: Chronic sinus problems or rhinitis Respiratory Complaints and Symptoms: Negative for: Chronic or frequent coughs; Shortness of Breath Gastrointestinal Complaints and Symptoms: Negative for: Frequent diarrhea; Nausea; Vomiting Genitourinary Complaints and Symptoms: Negative for: Frequent urination Integumentary (Skin) Complaints and Symptoms: Positive for: Wounds - right foot Medical History: Negative for: History of Burn Past Medical History Notes: Foot Ulcer Musculoskeletal Complaints and Symptoms: Negative for: Muscle Pain; Muscle Weakness Medical History: Negative for: Gout; Rheumatoid Arthritis; Osteoarthritis; Osteomyelitis Neurologic Complaints and Symptoms: Positive for: Numbness/parasthesias Medical History: Positive for: Neuropathy Psychiatric Complaints and Symptoms: Negative for: Claustrophobia; Suicidal Medical History: Negative for: Anorexia/bulimia; Confinement Anxiety Hematologic/Lymphatic Cardiovascular Medical History: Positive for: Hypertension Endocrine Medical History: Positive for: Type II Diabetes Negative for: Type I Diabetes Time with diabetes: since 2001 Treated with:  Insulin Blood sugar tested every day: Yes Tested : 3 times per day Blood sugar testing results: Breakfast: 115; Bedtime: 108 Immunological Oncologic Medical History: Negative for: Received Chemotherapy; Received Radiation Immunizations Pneumococcal Vaccine: Received Pneumococcal Vaccination: No Implantable Devices None Hospitalization / Surgery History Type of Hospitalization/Surgery Shoulder Surgery Family and Social History Cancer: No; Diabetes: Yes - Maternal Grandparents,Siblings; Heart Disease: No; Hypertension: Yes - Mother; Kidney Disease: No; Lung Disease: No; Seizures: No; Stroke: No; Thyroid Problems: Yes - aunt; Tuberculosis: No; Never smoker; Marital Status - Single; Alcohol Use: Moderate; Drug Use: Current History - marijuana; Caffeine Use: Daily - diet drinks; Financial Concerns: No; Food, Clothing or Shelter Needs: No; Support System Lacking: No; Transportation Concerns: No Electronic Signature(s) Signed: 06/03/2019 6:13:52 PM By: Linton Ham MD Signed: 06/03/2019 6:43:44 PM By: Carlene Coria RN Signed: 06/11/2019 9:20:10 AM By: Sandre Kitty Entered By: Sandre Kitty on 06/03/2019 09:59:26 -------------------------------------------------------------------------------- [place] Details Patient Name: Date of Service: Mike Bowen 06/03/2019 Medical Record [id number] Patient Account Number: [id number] Date of Birth/Sex: Treating RN: 06-05-1980 (38 y.o. Jerilynn Mages) Carlene Coria Primary Care Provider: PATIENT, NO Other Clinician: Referring Provider: Treating Provider/Extender:Rowe Warman, Anson Crofts, Designated Weeks in Treatment: 0 Diagnosis Coding ICD-10 Codes Code Description [phone] Type 2 diabetes mellitus with foot ulcer L97.512 Non-pressure chronic ulcer of other part of right foot with fat layer exposed E11.42 Type 2 diabetes mellitus with diabetic polyneuropathy Facility Procedures CPT4 Code Description: [id number] 99213 - WOUND CARE VISIT-LEV 3 EST  PT Modifier: 25 Quantity: 1 CPT4 Code Description: [id number] 11042 - DEB SUBQ TISSUE 20 SQ CM/< ICD-10 Diagnosis Description L97.512  Non-pressure chronic ulcer of other part of right foot with Modifier: fat layer expo Quantity: 1 sed Physician Procedures CPT4 Code Description: 1470929 99214 - WC PHYS LEVEL 4 - EST PT ICD-10 Diagnosis Description E11.621 Type 2 diabetes mellitus with foot ulcer L97.512 Non-pressure chronic ulcer of other part of right foot E11.42 Type 2 diabetes mellitus with diabetic  polyneuropathy Modifier: 25 with fat layer e Quantity: 1 xposed CPT4 Code Description: 5747340 37096 - WC PHYS SUBQ TISS 20 SQ CM ICD-10 Diagnosis Description L97.512 Non-pressure chronic ulcer of other part of right foot wit Modifier: h fat layer expo Quantity: 1 sed Electronic Signature(s) Signed: 06/03/2019 6:13:52 PM By: Linton Ham MD Signed: 06/03/2019 6:43:44 PM By: Carlene Coria RN Entered By: Carlene Coria on 06/03/2019 13:23:12

## 2019-06-11 NOTE — Progress Notes (Signed)
Disbro, Cope L. ([id number]) Visit Report for 06/03/2019 Abuse/Suicide Risk Screen Details Patient Name: Date of Service: Mike Bowen, Mike Bowen 06/03/2019 9:00 AM Medical Record Number:[id number] Patient Account Number: [id number] Date of Birth/Sex: Treating RN: 12-16-1980 (39 y.o. Mike Bowen) Carlene Coria Primary Care Alexsandro Salek: PATIENT, NO Other Clinician: Referring Christoffer Currier: Treating Poppi Scantling/Extender:Robson, Anson Crofts, Designated Weeks in Treatment: 0 Abuse/Suicide Risk Screen Items Answer ABUSE RISK SCREEN: Has anyone close to you tried to hurt or harm you recentlyo No Do you feel uncomfortable with anyone in your familyo No Has anyone forced you do things that you didnt want to doo No Electronic Signature(s) Signed: 06/03/2019 6:43:44 PM By: Carlene Coria RN Signed: 06/11/2019 9:20:10 AM By: Sandre Kitty Entered By: Sandre Kitty on 06/03/2019 09:59:34 -------------------------------------------------------------------------------- Activities of Daily Living Details Patient Name: Date of Service: Mike Bowen 06/03/2019 9:00 AM Medical Record [id number] Patient Account Number: [id number] Date of Birth/Sex: Treating RN: 1980/08/06 (39 y.o. Mike Bowen) Carlene Coria Primary Care Elisia Stepp: PATIENT, NO Other Clinician: Referring Britton Perkinson: Treating Ismelda Weatherman/Extender:Robson, Anson Crofts, Designated Weeks in Treatment: 0 Activities of Daily Living Items Answer Activities of Daily Living (Please select one for each item) Drive Automobile Completely Able Take Medications Completely Able Use Telephone Completely Able Care for Appearance Completely Able Use Toilet Completely Able Bath / Shower Completely Able Dress Self Completely Able Feed Self Completely Able Walk Completely Able Get In / Out Bed Completely Able Housework Completely Able Prepare Meals Completely Able Handle Money Completely Able Shop for Self Completely Able Electronic Signature(s) Signed: 06/03/2019 6:43:44 PM By:  Carlene Coria RN Signed: 06/11/2019 9:20:10 AM By: Sandre Kitty Entered By: Sandre Kitty on 06/03/2019 09:59:54 -------------------------------------------------------------------------------- Education Screening Details Patient Name: Date of Service: Mike Bowen 06/03/2019 9:00 AM Medical Record [id number] Patient Account Number: [id number] Date of Birth/Sex: Treating RN: 10-06-1980 (39 y.o. Mike Bowen) Dolores Lory, Morey Hummingbird Primary Care Suhana Wilner: PATIENT, NO Other Clinician: Referring Alvin Diffee: Treating Reon Hunley/Extender:Robson, Anson Crofts, Designated Weeks in Treatment: 0 Primary Learner Assessed: Patient Learning Preferences/Education Level/Primary Language Learning Preference: Explanation, Demonstration, Printed Material Highest Education Level: High School Preferred Language: Diplomatic Services operational officer Language Barrier: No Translator Needed: No Memory Deficit: No Emotional Barrier: No Cultural/Religious Beliefs Affecting Medical Care: No Physical Barrier Impaired Vision: No Impaired Hearing: No Decreased Hand dexterity: No Knowledge/Comprehension Knowledge Level: High Comprehension Level: High Ability to understand written High instructions: Ability to understand verbal High instructions: Motivation Anxiety Level: Calm Cooperation: Cooperative Education Importance: Acknowledges Need Interest in Health Problems: Asks Questions Perception: Coherent Willingness to Engage in Self- High Management Activities: Readiness to Engage in Self- High Management Activities: Electronic Signature(s) Signed: 06/03/2019 6:43:44 PM By: Carlene Coria RN Signed: 06/11/2019 9:20:10 AM By: Sandre Kitty Entered By: Sandre Kitty on 06/03/2019 10:00:27 -------------------------------------------------------------------------------- Fall Risk Assessment Details Patient Name: Date of Service: Mike Bowen 06/03/2019 9:00 AM Medical Record [id number] Patient Account  Number: [id number] Date of Birth/Sex: Treating RN: [birth year] (39 y.o. Mike Bowen) Dolores Lory, [place] Primary Care Jahiem Franzoni: PATIENT, NO Other Clinician: Referring Manav Pierotti: Treating Hallelujah Wysong/Extender:Robson, Anson Crofts, Designated Weeks in Treatment: 0 Fall Risk Assessment Items Have you had 2 or more falls in the last 12 monthso 0 No Have you had any fall that resulted in injury in the last 12 monthso 0 No FALLS RISK SCREEN History of falling - immediate or within 3 months 0 No Secondary diagnosis (Do you have 2 or more medical diagnoseso) 0 No Ambulatory aid None/bed rest/wheelchair/nurse 0 Yes Crutches/cane/walker 0 No Furniture 0 No Intravenous therapy Access/Saline/Heparin Lock 0 No Gait/Transferring Normal/ bed rest/ wheelchair 0  Yes Impaired (short steps with shuffle, may have difficulty arising from chair, 0 No head down, impaired balance) Mental Status Oriented to own ability 0 Yes Overestimates or forgets limitations 0 No Risk Level: Low Risk Score: 0 Electronic Signature(s) Signed: 06/03/2019 6:43:44 PM By: Yevonne Pax RN Signed: 06/11/2019 9:20:10 AM By: Karl Ito Entered By: Karl Ito on 06/03/2019 10:01:01 -------------------------------------------------------------------------------- Foot Assessment Details Patient Name: Date of Service: Mike Bowen 06/03/2019 9:00 AM Medical Record [id number] Patient Account Number: [id number] Date of Birth/Sex: Treating RN: 06-13-1980 (39 y.o. Judie Petit) Jettie Pagan, Lyla Son Primary Care Milinda Sweeney: PATIENT, NO Other Clinician: Referring Lennon Richins: Treating Yliana Gravois/Extender:Robson, Donald Siva, Designated Weeks in Treatment: 0 Foot Assessment Items Site Locations + = Sensation present, - = Sensation absent, C = Callus, U = Ulcer R = Redness, W = Warmth, M = Maceration, PU = Pre-ulcerative lesion F = Fissure, S = Swelling, D = Dryness Assessment Right: Left: Other Deformity: No No Prior Foot Ulcer: Yes No Prior Amputation: No  No Charcot Joint: No No Ambulatory Status: Ambulatory Without Help Gait: Steady Electronic Signature(s) Signed: 06/03/2019 6:43:44 PM By: Yevonne Pax RN Signed: 06/11/2019 9:20:10 AM By: Karl Ito Entered By: Karl Ito on 06/03/2019 10:03:50 -------------------------------------------------------------------------------- Nutrition Risk Screening Details Patient Name: Date of Service: Mike Bowen 06/03/2019 9:00 AM Medical Record [id number] Patient Account Number: [id number] Date of Birth/Sex: Treating RN: [birth year] (39 y.o. Judie Petit) Yevonne Pax Primary Care Takeesha Isley: PATIENT, NO Other Clinician: Referring Diyana Starrett: Treating Missi Mcmackin/Extender:Robson, Donald Siva, Designated Weeks in Treatment: 0 Height (in): 72 Weight (lbs): 256 Body Mass Index (BMI): 34.7 Nutrition Risk Screening Items Score Screening NUTRITION RISK SCREEN: I have an illness or condition that made me change the kind and/or 0 No amount of food I eat I eat fewer than two meals per day 0 No I eat few fruits and vegetables, or milk products 0 No I have three or more drinks of beer, liquor or wine almost every day 0 No I have tooth or mouth problems that make it hard for me to eat 0 No I don't always have enough money to buy the food I need 0 No I eat alone most of the time 0 No I take three or more different prescribed or over-the-counter drugs a day 1 Yes 2 Yes Without wanting to, I have lost or gained 10 pounds in the last six months I am not always physically able to shop, cook and/or feed myself 0 No Nutrition Protocols Good Risk Protocol Provide education on elevated blood sugars and Moderate Risk Protocol 0 impact on wound healing, as applicable High Risk Proctocol Risk Level: Moderate Risk Score: 3 Electronic Signature(s) Signed: 06/03/2019 6:43:44 PM By: Yevonne Pax RN Signed: 06/11/2019 9:20:10 AM By: Karl Ito Entered By: Karl Ito on 06/03/2019 10:02:23

## 2019-06-11 NOTE — Progress Notes (Signed)
[name], [name] ([id number]) Visit Report for 06/03/2019 Allergy List Details Patient Name: Date of Service: Mike Bowen, Mike Bowen 06/03/2019 9:00 AM Medical Record Number:[id number] Patient Account Number: [id number] Date of Birth/Sex: Treating RN: Mar 31, 1980 (38 y.o. Mike Bowen) Mike Bowen Primary Care Mike Bowen: PATIENT, NO Other Clinician: Referring Mike Bowen: Treating Mike Bowen/Extender:Mike Bowen, Designated Weeks in Treatment: 0 Allergies Active Allergies No Known Allergies Allergy Notes Electronic Signature(s) Signed: 06/11/2019 9:20:10 AM By: Karl Ito Entered By: Karl Ito on 06/03/2019 09:49:50 -------------------------------------------------------------------------------- Arrival Information Details Patient Name: Date of Service: Mike Bowen 06/03/2019 9:00 AM Medical Record [id number] Patient Account Number: [id number] Date of Birth/Sex: Treating RN: 08/02/1980 (38 y.o. Mike Bowen) Mike Bowen Primary Care Mike Bowen: PATIENT, NO Other Clinician: Referring Mike Bowen: Treating Mike Bowen/Extender:Mike Bowen, Designated Weeks in Treatment: 0 Visit Information Patient Arrived: Ambulatory Arrival Time: 09:46 Accompanied By: self Transfer Assistance: None Patient Identification Verified: Yes Secondary Verification Process Completed: Yes Patient Requires Transmission-Based No Precautions: Patient Has Alerts: No History Since Last Visit Electronic Signature(s) Signed: 06/11/2019 9:20:10 AM By: Karl Ito Entered By: Karl Ito on 06/03/2019 09:48:28 -------------------------------------------------------------------------------- Clinic Level of Care Assessment Details Patient Name: Date of Service: Mike Bowen, Mike Bowen 06/03/2019 9:00 AM Medical Record [id number] Patient Account Number: [id number] Date of Birth/Sex: Treating RN: [birth year] (38 y.o. Mike Bowen) Mike Bowen Primary Care Mike Bowen: PATIENT, NO Other Clinician: Referring Mike Bowen:  Treating Mike Bowen/Extender:Mike Bowen, Designated Weeks in Treatment: 0 Clinic Level of Care Assessment Items TOOL 1 Quantity Score X - Use when EandM and Procedure is performed on INITIAL visit 1 0 ASSESSMENTS - Nursing Assessment / Reassessment X - General Physical Exam (combine w/ comprehensive assessment (listed just below) 1 20 when performed on new pt. evals) X - Comprehensive Assessment (HX, ROS, Risk Assessments, Wounds Hx, etc.) 1 25 ASSESSMENTS - Wound and Skin Assessment / Reassessment []  - Dermatologic / Skin Assessment (not related to wound area) 0 ASSESSMENTS - Ostomy and/or Continence Assessment and Care []  - Incontinence Assessment and Management 0 []  - Ostomy Care Assessment and Management (repouching, etc.) 0 PROCESS - Coordination of Care X - Simple Patient / Family Education for ongoing care 1 15 []  - Complex (extensive) Patient / Family Education for ongoing care 0 X - Staff obtains , Records, Test Results / Process Orders 1 10 []  - Staff telephones HHA, Nursing Homes / Clarify orders / etc 0 []  - Routine Transfer to another Facility (non-emergent condition) 0 X - Routine Hospital Admission (non-emergent condition) 1 10 []  - New Admissions / / Ordering NPWT, Apligraf, etc. 0 []  - Emergency Hospital Admission (emergent condition) 0 PROCESS - Special Needs []  - Pediatric / Minor Patient Management 0 []  - Isolation Patient Management 0 []  - Hearing / Language / Visual special needs 0 []  - Assessment of Community assistance (transportation, D/C planning, etc.) 0 []  - Additional assistance / Altered mentation 0 []  - Support Surface(s) Assessment (bed, cushion, seat, etc.) 0 INTERVENTIONS - Miscellaneous []  - External ear exam 0 []  - Patient Transfer (multiple staff / / Similar devices) 0 []  - Simple Staple / Suture removal (25 or less) 0 []  - Complex Staple / Suture removal (26 or more) 0 []  -  Hypo/Hyperglycemic Management (do not check if billed separately) 0 []  - Ankle / Brachial Index (ABI) - do not check if billed separately 0 Has the patient been seen at the hospital within the last three years: Yes Total Score: 80 Level Of Care: New/Established - Level 3 Electronic Signature(s) Signed: 06/03/2019 6:43:44  PM By: Mike Coria RN Entered By: Mike Bowen on 06/03/2019 13:17:07 -------------------------------------------------------------------------------- Encounter Discharge Information Details Patient Name: Date of Service: Mike Bowen, Mike Bowen 06/03/2019 9:00 AM Medical Record Number:[id number] Patient Account Number: [id number] Date of Birth/Sex: Treating RN: Jul 07, 1980 (39 y.o. Mike Bowen Primary Care Camrie Stock: PATIENT, NO Other Clinician: Referring Steffon Gladu: Treating Mike Bowen/Extender:Mike Bowen, Designated Weeks in Treatment: 0 Encounter Discharge Information Items Post Procedure Vitals Discharge Condition: Stable Temperature (F): 98.6 Ambulatory Status: Ambulatory Pulse (bpm): 76 Discharge Destination: Home Respiratory Rate (breaths/min): 19 Transportation: Private Auto Blood Pressure (mmHg): 177/97 Accompanied By: self Schedule Follow-up Appointment: Yes Clinical Summary of Care: Patient Declined Electronic Signature(s) Signed: 06/03/2019 6:07:04 PM By: Mike Bowen Entered By: Mike Bowen on 06/03/2019 11:09:32 -------------------------------------------------------------------------------- Lower Extremity Assessment Details Patient Name: Date of Service: Mike Bowen 06/03/2019 9:00 AM Medical Record [id number] Patient Account Number: [id number] Date of Birth/Sex: Treating RN: Feb 08, 1981 (38 y.o. Mike Bowen) Mike Bowen Primary Care Mike Bowen: PATIENT, NO Other Clinician: Referring Mike Bowen: Treating Mike Bowen/Extender:Mike Bowen, Designated Weeks in Treatment: 0 Edema Assessment Assessed: [Left: No] [Right: No] Edema:  [Left: Ye] [Right: s] Calf Left: Right: Point of Measurement: cm From Medial Instep cm 42.5 cm Ankle Left: Right: Point of Measurement: cm From Medial Instep cm 28.5 cm Vascular Assessment Pulses: Dorsalis Pedis Palpable: [Right:Yes] Notes right DP and PT pulses noncompressible Electronic Signature(s) Signed: 06/03/2019 6:43:44 PM By: Mike Coria RN Signed: 06/11/2019 9:20:10 AM By: Sandre Kitty Entered By: Sandre Kitty on 06/03/2019 10:11:53 -------------------------------------------------------------------------------- Multi Wound Chart Details Patient Name: Date of Service: Mike Bowen 06/03/2019 9:00 AM Medical Record [id number] Patient Account Number: [id number] Date of Birth/Sex: Treating RN: 13-Mar-1980 (38 y.o. 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] Comorbid History: [3:Hypertension, Type II Diabetes, Neuropathy] [N/A:N/A] Date Acquired: [3:04/02/2019] [N/A:N/A] Weeks of Treatment: [3:0] [N/A:N/A] Wound Status: [3:Open] [N/A:N/A] Measurements L x W x D 1.6x0.8x0.1 [N/A:N/A] (cm) Area (cm) : [3:1.005] [N/A:N/A] Volume (cm) : [3:0.101] [N/A:N/A] Classification: [3:Grade 2] [N/A:N/A] Exudate Amount: [3:Small] [N/A:N/A] Exudate Type: [3:Serous] [N/A:N/A] Exudate Color: [3:amber] [N/A:N/A] Wound Margin: [3:Flat and Intact] [N/A:N/A] Granulation Amount: [3:Large (67-100%)] [N/A:N/A] Granulation Quality: [3:Red]  [N/A:N/A] Necrotic Amount: [3:None Present (0%)] [N/A:N/A] Exposed Structures: [3:Fat Layer (Subcutaneous N/A Tissue) Exposed: Yes Fascia: No Tendon: No Muscle: No Joint: No Bone: No] Epithelialization: [3:Small (1-33%)] [N/A:N/A] Debridement: [3:Debridement - Excisional N/A] Pre-procedure [3:10:48] [N/A:N/A] Verification/Time Out Taken: Tissue Debrided: [3:Subcutaneous] [N/A:N/A] Level: [3:Skin/Subcutaneous Tissue N/A] Debridement Area (sq cm):1.28 [N/A:N/A] Instrument: [3:Curette] [N/A:N/A] Bleeding: [3:Moderate] [N/A:N/A] Hemostasis Achieved: [3:Pressure] [N/A:N/A] Procedural Pain: [3:0] [N/A:N/A] Post Procedural Pain: [3:0] [N/A:N/A] Debridement Treatment Procedure was tolerated [N/A:N/A] Response: [3:well] Post Debridement [3:1.6x0.8x0.1] [N/A:N/A] Measurements L x W x D (cm) Post Debridement [3:0.101] [N/A:N/A] Volume: (cm) Procedures Performed: Debridement [N/A:N/A] Electronic Signature(s) Signed: 06/03/2019 6:13:52 PM By: Linton Ham MD Signed: 06/03/2019 6:43:44 PM By: Mike Coria RN Entered By: Linton Ham on 06/03/2019 10:57:31 -------------------------------------------------------------------------------- Multi-Disciplinary Care Plan Details Patient Name: Date of Service: Mike Bowen 06/03/2019 9:00 AM Medical Record [id number] Patient Account Number: [id number] Date of Birth/Sex: Treating RN: 09-03-1980 (38 y.o. Oval Linsey Primary Care Antonio Creswell: PATIENT, NO Other Clinician: Referring Laurren Lepkowski: Treating Lundy Cozart/Extender:Robson, Legrand Como None,  Designated Weeks in Treatment: 0 Active Inactive Nutrition Nursing Diagnoses: Impaired glucose control: actual or potential Goals: Patient/caregiver verbalizes understanding of need to maintain therapeutic glucose control per primary care physician Date Initiated: 06/03/2019 Target Resolution Date: 07/04/2019 Goal Status: Active Interventions: Assess HgA1c results as ordered upon admission and as  needed Assess patient nutrition upon admission and as needed per policy Notes: Wound/Skin Impairment Nursing Diagnoses: Knowledge deficit related to ulceration/compromised skin integrity Goals: Patient/caregiver will verbalize understanding of skin care regimen Date Initiated: 06/03/2019 Target Resolution Date: 07/04/2019 Goal Status: Active Ulcer/skin breakdown will have a volume reduction of 30% by week 4 Date Initiated: 06/03/2019 Target Resolution Date: 07/04/2019 Goal Status: Active Interventions: Assess patient/caregiver ability to obtain necessary supplies Assess patient/caregiver ability to perform ulcer/skin care regimen upon admission and as needed Assess ulceration(s) every visit Notes: Electronic Signature(s) Signed: 06/03/2019 6:43:44 PM By: Mike Pax RN Entered By: Mike Bowen on 06/03/2019 10:49:53 -------------------------------------------------------------------------------- Pain Assessment Details Patient Name: Date of Service: Mike Bowen, Mike Bowen 06/03/2019 9:00 AM Medical Record [id number] Patient Account Number: [id number] Date of Birth/Sex: Treating RN: 1980-07-01 (38 y.o. Mike Bowen) Mike Bowen Primary Care Billi Bright: PATIENT, NO Other Clinician: Referring Arleta Ostrum: Treating Tangela Dolliver/Extender:Mike Bowen, Designated Weeks in Treatment: 0 Active Problems Location of Pain Severity and Description of Pain Patient Has Paino No Site Locations Rate the pain. Current Pain Level: 0 Pain Management and Medication Current Pain Management: Electronic Signature(s) Signed: 06/03/2019 6:43:44 PM By: Mike Pax RN Signed: 06/11/2019 9:20:10 AM By: Karl Ito Entered By: Karl Ito on 06/03/2019 10:22:08 -------------------------------------------------------------------------------- Patient/Caregiver Education Details Patient Name: Date of Service: Mike Bowen, Mike L. 4/6/2021andnbsp9:00 AM Medical Record [phone] Patient Account Number:  [id number] Date of Birth/Gender: Treating RN: [birth year] (38 y.o. Mike Bowen) Jettie Pagan, Lyla Son Primary Care Physician: PATIENT, NO Other Clinician: Referring Physician: Treating Physician/Extender:Mike Bowen, Designated Weeks in Treatment: 0 Education Assessment Education Provided To: Patient Education Topics Provided Wound/Skin Impairment: Methods: Explain/Verbal Responses: State content correctly Electronic Signature(s) Signed: 06/03/2019 6:43:44 PM By: Mike Pax RN Entered By: Mike Bowen on 06/03/2019 10:50:07 -------------------------------------------------------------------------------- Wound Assessment Details Patient Name: Date of Service: Mike Bowen, Mike Bowen 06/03/2019 9:00 AM Medical Record [id number] Patient Account Number: [id number] Date of Birth/Sex: Treating RN: [birth year] (38 y.o. Mike Bowen) Mike Bowen Primary Care Raedyn Klinck: PATIENT, NO Other Clinician: Referring France Noyce: Treating Elka Satterfield/Extender:Mike Bowen, Designated Weeks in Treatment: 0 Wound Status Wound Number: 3 Primary Diabetic Wound/Ulcer of the Lower Etiology: Extremity Wound Location: Right Foot Wound Status: Open Wounding Event: Blister Comorbid Hypertension, Type II Diabetes, Date Acquired: 04/02/2019 History: Neuropathy Weeks Of Treatment: 0 Clustered Wound: No Wound Measurements Length: (cm) 1.6 Width: (cm) 0.8 Depth: (cm) 0.1 Area: (cm) 1.005 Volume: (cm) 0.101 Wound Description Classification: Grade 2 Wound Margin: Flat and Intact Exudate Amount: Small Exudate Type: Serous Exudate Color: amber Wound Bed Granulation Amount: Large (67-100%) Granulation Quality: Red Necrotic Amount: None Present (0%) Foul Odor After Cleansing: No Slough/Fibrino Yes Exposed Structure Fascia Exposed: Fat Layer (Subcutaneous Tissue) Exposed: Tendon Exposed: Muscle Exposed: Joint Exposed: Bone Exposed: % Reduction in Area: % Reduction in Volume: Epithelialization: Small  (1-33%) Tunneling: No Undermining: No No Yes No No No No Treatment Notes Wound #3 (Right Foot) 1. Cleanse With Wound Cleanser 2. Periwound Care Skin Prep 3. Primary Dressing Applied Collegen AG Hydrogel or K-Y Jelly 4. Secondary Dressing Foam Border Dressing Electronic Signature(s) Signed: 06/03/2019 6:43:44 PM By: Mike Pax RN Signed: 06/11/2019 9:20:10 AM By: Karl Ito Entered By: Karl Ito on 06/03/2019 10:18:24 -------------------------------------------------------------------------------- Vitals Details Patient Name: Date of Service:  Mike Bowen, Mike L. 06/03/2019 9:00 AM Medical Record [id number] Patient Account Number: [id number] Date of Birth/Sex: Treating RN: [birth year] (38 y.o. Mike Bowen) Mike Bowen Primary Care Lezli Danek: PATIENT, NO Other Clinician: Referring Lateria Alderman: Treating Darrly Loberg/Extender:Mike Bowen, Designated Weeks in Treatment: 0 Vital Signs Time Taken: 09:48 Temperature (F): 98.6 Height (in): 72 Pulse (bpm): 76 Source: Stated Respiratory Rate (breaths/min): 18 Weight (lbs): 256 Blood Pressure (mmHg): 177/97 Source: Stated Capillary Blood Glucose (mg/dl): 96 Body Mass Index (BMI): 34.7 Reference Range: 80 - 120 mg / dl Notes glucose per pt report last night Electronic Signature(s) Signed: 06/03/2019 6:07:04 PM By: Cherylin Mylar Entered By: Cherylin Mylar on 06/03/2019 11:09:02

## 2019-06-12 ENCOUNTER — Encounter (HOSPITAL_BASED_OUTPATIENT_CLINIC_OR_DEPARTMENT_OTHER): Payer: Self-pay | Admitting: Internal Medicine

## 2019-06-12 ENCOUNTER — Other Ambulatory Visit: Payer: Self-pay

## 2019-06-14 NOTE — Progress Notes (Signed)
[name], [name] ([id number]) Visit Report for 06/12/2019 HPI Details Mike Bowen Name: Date of Service: Mike Bowen, Mike Bowen 06/12/2019 8:30 AM Medical Record Number:[id number] Mike Bowen Account Number: [id number] Date of Birth/Sex: Treating RN: October 13, 1980 (39 y.o. Mike Bowen Primary Care Provider: PATIENT, NO Other Clinician: Referring Provider: Treating Provider/Extender:Mike Bowen, Mike Bowen, Mike Bowen in Treatment: 1 History of Present Illness HPI Description: 09/01/16; this is a 39 year old man with type 2 diabetes on oral agents. It is probable that his diabetes is not under good control although there has not been a recent hemoglobin A1c. He was in the ER on 06/25/16 with a glucose of 418. He does not have primary care. He is a nonsmoker. He has no known history of diabetic PAD or neuropathy. The Mike Bowen states that 2 Bowen ago he noted a blister on his right lateral fifth metatarsal head. This became erythematous and painful. He was seen in the ER on 08/24/16 and noted to have an ulcer. Culture of this grew methicillin sensitive staph aureus and he is on 10 days of doxycycline starting on 6/30. X-ray at the time was negative for bony abnormality. He does not have a prior history of diabetic wounds. He has not been systemically unwell. Lab work on 6/28 showed a white count of 10 with 80% neutrophils. Comprehensive metabolic panel was normal other than an albumin of 3.4. BUN and creatinine were normal CO2 was 28. 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 He has been off work for week 09/08/16; Mike Bowen continues to have a difficult wound on the lateral aspect of his right fifth metatarsal head. Once again significant tunneling inferiorly. Surrounding tissue is thick and almost the plastic-like consistency. Extensive debridement done. No obvious infection is seen in the area. 09/15/16; difficult wound on the lateral aspect of the right fifth metatarsal head. There is some  tunneling to this inferiorly. Aggressive debridement I did last week seems to of left a healthy granulated wound bed. There is no obvious infection. We have been using silver collagen. 09/22/16; Arrives with wound no better. Surrounding callous and non viable tissue. silver collagen 09/29/16 on evaluation today Mike Bowen presents for evaluation of his right fifth metatarsal head ulcer. Fortunately there's no evidence of erythema surrounding infection and there is no purulent discharge at this point. Mike Bowen tells me that he is having no pain currently. He has no nausea, vomiting, diarrhea that would indicate a systemic infection. Overall things seem to be doing well although he had not been checking his blood sugars and therefore he cannot tell me how these have been running due to the fact that he had not yet gotten strips. Being out of work secondary to this ulcer has really caused him a lot of financial hardship. 10/06/2016 -- he has brought some papers from his workplace regarding disability and I have signed off on these today 10/20/16; he had Hydrofera Blue prescribed last week however he didn't have this one reason or another and he's been putting silver collagen on this. He arrives in clinic today extremely hypertensive. I had given him blood pressure medication myself about a month ago which he appears to be taking intermittently by his own admission. We also suggested primary physician's and gave him a list of options he doesn't seem to a followed through with any of this 10/27/16; using silver collagen. Once again the circumference of the wound appears macerated. Requires debridement. I'll change to silver alginate. 11/03/16; the Mike Bowen is using silver alginate as of last week.  Small wound with healthy granulation. He has run out of his blood pressure medications although his blood pressure was 141/90 today. 11/17/16; Mike Bowen is using silver alginate. Mike Bowen arrives today with smaller looking  wound although was some depth. He had an appointment with his primary care but had to cancel it last week 11/24/16; wound on the right lateral fifth metatarsal head. Small open area last week that I used Iodosorb ointment 4 with a hope of closing this down. He arrives today with a small open wound but with a considerable degree of undermining. Post debridement change to [facility] 12/08/16; the Mike Bowen arrives today with a wound on the right lateral fifth metatarsal head in a healed state. When he was last here I debrided the undermining here to a healthy surface and he seems to of healed over in the last 2 Bowen. There is a nice viable surface here. He is concerned about his job vis--vis active on his feet. I advised him to keep this area protected in issues. Initially this started as a blister that became secondarily infected or infection may have been the primary issue am not really certain. He is a type II diabetic READMISSION 12/28/16; I heal this Mike Bowen out a little over 2 Bowen ago. He states that the wound reopened on 12/24/16. He was in the ER yesterday. His lab work was reasonably unremarkable other than a glucose of over 300. A culture of the wound was done. X-ray showed suggestion of early osteomyelitis about the fifth digit proximal phalanx which might represent early osteomyelitis. I believe he was given IV antibiotics and discharged on Augmentin twice a day for 2 Bowen. A culture was done that is pending. He tells me he is only on metformin for his diabetes. He has poorly controlled hypertension which we also noted when he was in the clinic last time he has an appointment with primary care on 01/10/17. ABI was 1.29 in this clinic last time He had a mildly elevated C-reactive protein his sedimentation rate was only in the 20s white count was normal 01/05/17; the wound itself does not look ominous today. Using silver alginate. Culture I did last week showed no growth although he  did have group B strep in the ER. He is completing the Augmentin they prescribed. He has not yet had a call for his MRI 01/12/17; the Mike Bowen's MRI essentially showed the same thing as his x-ray that is a soft tissue ulcer over the lateral aspect of the fifth MTP with surrounding cellulitis and a small area of cortical irregularity along the lateral base of the fifth proximal phalanx with mild marrow edema and minimal enhancement suggesting a small focus of osteomyelitis versus reactive marrow changes secondary to adjacent inflammation. A small focus of osteomyelitis was favored. The Mike Bowen is completing 2 Bowen of Augmentin this will need to be extended. he is using silver alginate on the wound 01/26/18; Mike Bowen is picked up his final 2 Bowen of Augmentin. Wound is smaller and dimensions down. Still using silver alginate 02/02/17; 2 final Bowen of Augmentin which is different from what I stated last week. He says he has one more refill. Would be reasonable to complete this. He started the Augmentin at the beginning of November when he was in the emergency room I continue it for a total of 6 Bowen. His wound is basically closed over at this point although I am not completely sure of the viability of the surface and I would like to continue to  dress this for a further week at least The Mike Bowen stubbed his toe while playing with his children he has a small open area on the tip of the fifth toeon the right. This is small and superficial 02/09/17 right 5th met head. This was a re-occurrence after being healed once. I treated him with oral Augmenting for a small area of underlying osteomyellits He is completing this.His wound is closed READMISSION 06/03/2019 This is a now 39 year old man with type 2 diabetes and a history of peripheral neuropathy. We had them here on 2 occasions in 2018 with a wound on the lateral part of the fifth metatarsal head. He was here for a period of time from July to October  2018 and was healed out however he rapidly broke down. Was discovered to have osteomyelitis in this area I gave him empiric Augmentin this close note closed over and he has not had trouble in this area since. He says roughly 2 months ago at work he got his feet wet and he developed a blister and since then he has had a wound on the right lateral foot he has been using Neosporin on this. 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 Past medical history; type 2 diabetes with peripheral neuropathy, hypertension and a history of atrial fibrillation. He thinks his last hemoglobin A1c was in the sevens range. His ABI in our Clinic was noncompressible 4/15; wound is measuring smaller. We are using silver collagen K-Y jelly and bordered foam. He is using sandals when he is on his own time and padding this religiously and his work boots at work. Electronic Signature(s) Signed: 06/14/2019 6:55:41 AM By: Linton Ham MD Entered By: Linton Ham on 06/12/2019 09:06:38 -------------------------------------------------------------------------------- Physical Exam Details Mike Bowen Name: Date of Service: [name] 06/12/2019 8:30 AM Medical Record [id number] Mike Bowen Account Number: [id number] Date of Birth/Sex: Treating RN: 21-Feb-1981 (39 y.o. Mike Bowen Primary Care Provider: PATIENT, NO Other Clinician: Referring Provider: Treating Provider/Extender:Sharief Wainwright, Mike Bowen, Mike Bowen in Treatment: 1 Constitutional Mike Bowen is hypertensive.. Pulse regular and within target range for Mike Bowen.Marland Kitchen Respirations regular, non-labored and within target range.. Temperature is normal and within the target range for the Mike Bowen.Marland Kitchen Appears in no distress. Cardiovascular Pedal pulses are palpable. Notes Wound exam; the areas on the right lateral foot just proximal to the base of the fifth metatarsal.  This actually looks smaller and measures smaller. The wound surface was debrided last time I removed the rolled edges. They are not present this time. There is some debris present under illumination. I elected not to debride this today. There is no evidence of surrounding infection Electronic Signature(s) Signed: 06/14/2019 6:55:41 AM By: Linton Ham MD Entered By: Linton Ham on 06/12/2019 09:09:12 -------------------------------------------------------------------------------- Physician Orders Details Mike Bowen Name: Date of Service: [name] 06/12/2019 8:30 AM Medical Record [id number] Mike Bowen Account Number: [id number] Date of Birth/Sex: Treating RN: 03/18/1980 (39 y.o. Janyth Contes Primary Care Provider: PATIENT, NO Other Clinician: Referring Provider: Treating Provider/Extender:Amiir Heckard, Mike Bowen, Mike Bowen in Treatment: 1 Verbal / Phone Orders: No Diagnosis Coding ICD-10 Coding Code Description E11.621 Type 2 diabetes mellitus with foot ulcer L97.512 Non-pressure chronic ulcer of other part of right foot with fat layer exposed E11.42 Type 2 diabetes mellitus with diabetic polyneuropathy Follow-up Appointments Return Appointment in 2 Bowen. - Dr. Dellia Nims Nurse Visit: - Next week. Dressing Change Frequency Other: - 2 times per week Wound Cleansing  May shower and wash wound with soap and water. - on days dressing ordered to be changed , all others shower with protection, area can not get wet on those days Primary Wound Dressing Silver Collagen - moisten with hydrogel Secondary Dressing Foam Border - area to be padded with gauze on days Mike Bowen works Engineer, maintenance) Signed: 06/13/2019 4:22:27 PM By: Levan Hurst RN, BSN Signed: 06/14/2019 6:55:41 AM By: Linton Ham MD Entered By: Levan Hurst on 06/12/2019 08:55:10 -------------------------------------------------------------------------------- Problem List Details Mike Bowen  Name: Date of Service: [name] 06/12/2019 8:30 AM Medical Record [id number] Mike Bowen Account Number: [id number] Date of Birth/Sex: Treating RN: 1980-04-15 (39 y.o. Janyth Contes Primary Care Provider: PATIENT, NO Other Clinician: Referring Provider: Treating Provider/Extender:Camdyn Beske, Mike Bowen, Mike Bowen in Treatment: 1 Active Problems ICD-10 Evaluated Encounter Code Description Active Date Today Diagnosis E11.621 Type 2 diabetes mellitus with foot ulcer 06/03/2019 No Yes L97.512 Non-pressure chronic ulcer of other part of right foot 06/03/2019 No Yes with fat layer exposed E11.42 Type 2 diabetes mellitus with diabetic polyneuropathy 06/03/2019 No Yes Inactive Problems Resolved Problems Electronic Signature(s) Signed: 06/14/2019 6:55:41 AM By: Linton Ham MD Entered By: Linton Ham on 06/12/2019 09:05:54 -------------------------------------------------------------------------------- Progress Note Details Mike Bowen Name: Date of Service: [name] 06/12/2019 8:30 AM Medical Record [id number] Mike Bowen Account Number: [id number] Date of Birth/Sex: Treating RN: 1980/09/12 (39 y.o. Mike Bowen Primary Care Provider: PATIENT, NO Other Clinician: Referring Provider: Treating Provider/Extender:Dev Dhondt, Mike Bowen, Mike Bowen in Treatment: 1 Subjective History of Present Illness (HPI) 09/01/16; this is a 39 year old man with type 2 diabetes on oral agents. It is probable that his diabetes is not under good control although there has not been a recent hemoglobin A1c. He was in the ER on 06/25/16 with a glucose of 418. He does not have primary care. He is a nonsmoker. He has no known history of diabetic PAD or neuropathy. The Mike Bowen states that 2 Bowen ago he noted a blister on his right lateral fifth metatarsal head. This became erythematous and painful. He was seen in the ER on 08/24/16 and noted to have an ulcer. Culture of this  grew methicillin sensitive staph aureus and he is on 10 days of doxycycline starting on 6/30. X-ray at the time was negative for bony abnormality. He does not have a prior history of diabetic wounds. He has not been systemically unwell. Lab work on 6/28 showed a white count of 10 with 80% neutrophils. Comprehensive metabolic panel was normal other than an albumin of 3.4. BUN and creatinine were normal CO2 was 28. 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 He has been off work for week 09/08/16; Mike Bowen continues to have a difficult wound on the lateral aspect of his right fifth metatarsal head. Once again significant tunneling inferiorly. Surrounding tissue is thick and almost the plastic-like consistency. Extensive debridement done. No obvious infection is seen in the area. 09/15/16; difficult wound on the lateral aspect of the right fifth metatarsal head. There is some tunneling to this inferiorly. Aggressive debridement I did last week seems to of left a healthy granulated wound bed. There is no obvious infection. We have been using silver collagen. 09/22/16; Arrives with wound no better. Surrounding callous and non viable tissue. silver collagen 09/29/16 on evaluation today Mike Bowen presents for evaluation of his right fifth metatarsal head ulcer. Fortunately there's no evidence of erythema surrounding infection and there is no purulent discharge at this point. Mike Bowen tells me that he is having no  pain currently. He has no nausea, vomiting, diarrhea that would indicate a systemic infection. Overall things seem to be doing well although he had not been checking his blood sugars and therefore he cannot tell me how these have been running due to the fact that he had not yet gotten strips. Being out of work secondary to this ulcer has really caused him a lot of financial hardship. 10/06/2016 -- he has brought some papers from his workplace regarding disability and I have signed off  on these today 10/20/16; he had Hydrofera Blue prescribed last week however he didn't have this one reason or another and he's been putting silver collagen on this. He arrives in clinic today extremely hypertensive. I had given him blood pressure medication myself about a month ago which he appears to be taking intermittently by his own admission. We also suggested primary physician's and gave him a list of options he doesn't seem to a followed through with any of this 10/27/16; using silver collagen. Once again the circumference of the wound appears macerated. Requires debridement. I'll change to silver alginate. 11/03/16; the Mike Bowen is using silver alginate as of last week. Small wound with healthy granulation. He has run out of his blood pressure medications although his blood pressure was 141/90 today. 11/17/16; Mike Bowen is using silver alginate. Mike Bowen arrives today with smaller looking wound although was some depth. He had an appointment with his primary care but had to cancel it last week 11/24/16; wound on the right lateral fifth metatarsal head. Small open area last week that I used Iodosorb ointment 4 with a hope of closing this down. He arrives today with a small open wound but with a considerable degree of undermining. Post debridement change to [facility] 12/08/16; the Mike Bowen arrives today with a wound on the right lateral fifth metatarsal head in a healed state. When he was last here I debrided the undermining here to a healthy surface and he seems to of healed over in the last 2 Bowen. There is a nice viable surface here. He is concerned about his job vis--vis active on his feet. I advised him to keep this area protected in issues. Initially this started as a blister that became secondarily infected or infection may have been the primary issue am not really certain. He is a type II diabetic READMISSION 12/28/16; I heal this Mike Bowen out a little over 2 Bowen ago. He states that the  wound reopened on 12/24/16. He was in the ER yesterday. His lab work was reasonably unremarkable other than a glucose of over 300. A culture of the wound was done. X-ray showed suggestion of early osteomyelitis about the fifth digit proximal phalanx which might represent early osteomyelitis. I believe he was given IV antibiotics and discharged on Augmentin twice a day for 2 Bowen. A culture was done that is pending. He tells me he is only on metformin for his diabetes. He has poorly controlled hypertension which we also noted when he was in the clinic last time he has an appointment with primary care on 01/10/17. ABI was 1.29 in this clinic last time He had a mildly elevated C-reactive protein his sedimentation rate was only in the 20s white count was normal 01/05/17; the wound itself does not look ominous today. Using silver alginate. Culture I did last week showed no growth although he did have group B strep in the ER. He is completing the Augmentin they prescribed. He has not yet had a call for his  MRI 01/12/17; the Mike Bowen's MRI essentially showed the same thing as his x-ray that is a soft tissue ulcer over the lateral aspect of the fifth MTP with surrounding cellulitis and a small area of cortical irregularity along the lateral base of the fifth proximal phalanx with mild marrow edema and minimal enhancement suggesting a small focus of osteomyelitis versus reactive marrow changes secondary to adjacent inflammation. A small focus of osteomyelitis was favored. The Mike Bowen is completing 2 Bowen of Augmentin this will need to be extended. he is using silver alginate on the wound 01/26/18; Mike Bowen is picked up his final 2 Bowen of Augmentin. Wound is smaller and dimensions down. Still using silver alginate 02/02/17; 2 final Bowen of Augmentin which is different from what I stated last week. He says he has one more refill. Would be reasonable to complete this. He started the Augmentin at the  beginning of November when he was in the emergency room I continue it for a total of 6 Bowen. His wound is basically closed over at this point although I am not completely sure of the viability of the surface and I would like to continue to dress this for a further week at least The Mike Bowen stubbed his toe while playing with his children he has a small open area on the tip of the fifth toeon the right. This is small and superficial 02/09/17 right 5th met head. This was a re-occurrence after being healed once. I treated him with oral Augmenting for a small area of underlying osteomyellits He is completing this.His wound is closed READMISSION 06/03/2019 This is a now 39 year old man with type 2 diabetes and a history of peripheral neuropathy. We had them here on 2 occasions in 2018 with a wound on the lateral part of the fifth metatarsal head. He was here for a period of time from July to October 2018 and was healed out however he rapidly broke down. Was discovered to have osteomyelitis in this area I gave him empiric Augmentin this close note closed over and he has not had trouble in this area since. He says roughly 2 months ago at work he got his feet wet and he developed a blister and since then he has had a wound on the right lateral foot he has been using Neosporin on this. 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 Past medical history; type 2 diabetes with peripheral neuropathy, hypertension and a history of atrial fibrillation. He thinks his last hemoglobin A1c was in the sevens range. His ABI in our Clinic was noncompressible 4/15; wound is measuring smaller. We are using silver collagen K-Y jelly and bordered foam. He is using sandals when he is on his own time and padding this religiously and his work boots at work. Objective Constitutional Mike Bowen is hypertensive.. Pulse regular and within  target range for Mike Bowen.Marland Kitchen Respirations regular, non-labored and within target range.. Temperature is normal and within the target range for the Mike Bowen.Marland Kitchen Appears in no distress. Vitals Time Taken: 8:35 AM, Height: 72 in, Weight: 256 lbs, BMI: 34.7, Temperature: 98.3 F, Pulse: 80 bpm, Respiratory Rate: 17 breaths/min, Blood Pressure: 172/99 mmHg. General Notes: Mike Bowen stated he has not checked his CBG this morning Cardiovascular Pedal pulses are palpable. General Notes: Wound exam; the areas on the right lateral foot just proximal to the base of the fifth metatarsal. This actually looks smaller and measures smaller. The wound surface  was debrided last time I removed the rolled edges. They are not present this time. There is some debris present under illumination. I elected not to debride this today. There is no evidence of surrounding infection Integumentary (Hair, Skin) Wound #3 status is Open. Original cause of wound was Blister. The wound is located on the Right Foot. The wound measures 1cm length x 0.5cm width x 0.1cm depth; 0.393cm^2 area and 0.039cm^3 volume. There is Fat Layer (Subcutaneous Tissue) Exposed exposed. There is no tunneling or undermining noted. There is a small amount of serous drainage noted. The wound margin is flat and intact. There is large (67-100%) pink granulation within the wound bed. There is no necrotic tissue within the wound bed. Assessment Active Problems ICD-10 Type 2 diabetes mellitus with foot ulcer Non-pressure chronic ulcer of other part of right foot with fat layer exposed Type 2 diabetes mellitus with diabetic polyneuropathy Plan Follow-up Appointments: Return Appointment in 2 Bowen. - Dr. Dellia Nims Nurse Visit: - Next week. Dressing Change Frequency: Other: - 2 times per week Wound Cleansing: May shower and wash wound with soap and water. - on days dressing ordered to be changed , all others shower with protection, area can not get wet on those  days Primary Wound Dressing: Silver Collagen - moisten with hydrogel Secondary Dressing: Foam Border - area to be padded with gauze on days Mike Bowen works 1. Silver collagen moistened with hydrogel 2. Border foam the Mike Bowen is changing this every second day 3. He appears to be offloading this properly and his work boots were going to have to continue with this going forward. 4. No debridement today but I cannot rule out that next time if the dimensions are not smaller 5. Follow-up in 2 Bowen Electronic Signature(s) Signed: 06/14/2019 6:55:41 AM By: Linton Ham MD Entered By: Linton Ham on 06/12/2019 09:09:58 -------------------------------------------------------------------------------- SuperBill Details Mike Bowen Name: Date of Service: [name] 06/12/2019 Medical Record [id number] Mike Bowen Account Number: [id number] Date of Birth/Sex: Treating RN: 1980/11/26 (39 y.o. 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

## 2019-06-18 ENCOUNTER — Encounter (HOSPITAL_BASED_OUTPATIENT_CLINIC_OR_DEPARTMENT_OTHER): Payer: Self-pay | Admitting: Physician Assistant

## 2019-06-26 ENCOUNTER — Encounter (HOSPITAL_BASED_OUTPATIENT_CLINIC_OR_DEPARTMENT_OTHER): Payer: Self-pay | Admitting: Internal Medicine

## 2019-06-26 NOTE — Progress Notes (Signed)
Lutes, Kevyn L. ([id number]) Visit Report for 06/26/2019 HPI Details Patient Name: Date of Service: Mike Bowen 06/26/2019 8:45 A M Medical Record Number: [id number] Patient Account Number: [id number] Date of Birth/Sex: Treating RN: [birth year] (39 y.o. [name] Primary Care Provider: PA Haig Prophet, NO Other Clinician: Referring Provider: Treating Provider/Extender: Silvano Rusk, Designated Weeks in Treatment: 3 History of Present Illness HPI Description: 09/01/16; this is a 39 year old man with type 2 diabetes on oral agents. It is probable that his diabetes is not under good control although there has not been a recent hemoglobin A1c. He was in the ER on 06/25/16 with a glucose of 418. He does not have primary care. He is a nonsmoker. He has no known history of diabetic PAD or neuropathy. The patient states that 2 weeks ago he noted a blister on his right lateral fifth metatarsal head. This became erythematous and painful. He was seen in the ER on 08/24/16 and noted to have an ulcer. Culture of this grew methicillin sensitive staph aureus and he is on 10 days of doxycycline starting on 6/30. X-ray at the time was negative for bony abnormality. He does not have a prior history of diabetic wounds. He has not been systemically unwell. Lab work on 6/28 showed a white count of 10 with 80% neutrophils. Comprehensive metabolic panel was normal other than an albumin of 3.4. BUN and creatinine were normal CO2 was 28. 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 Once again significant tunneling inferiorly. Surrounding tissue is thick and almost the plastic-like consistency. Extensive debridement done. No obvious infection is seen in the area. 09/15/16; difficult wound on the lateral aspect of the right fifth metatarsal head. There is  some tunneling to this inferiorly. Aggressive debridement I did last week seems to of left a healthy granulated wound bed. There is no obvious infection. We have been using silver collagen. 09/22/16; Arrives with wound no better. Surrounding callous and non viable tissue. silver collagen 09/29/16 on evaluation today patient presents for evaluation of his right fifth metatarsal head ulcer. Fortunately there's no evidence of erythema surrounding infection and there is no purulent discharge at this point. Patient tells me that he is having no pain currently. He has no nausea, vomiting, diarrhea that would indicate a systemic infection. Overall things seem to be doing well although he had not been checking his blood sugars and therefore he cannot tell me how these have been running due to the fact that he had not yet gotten strips. Being out of work secondary to this ulcer has really caused him a lot of financial hardship. 10/06/2016 -- he has brought some papers from his workplace regarding disability and I have signed off on these today 10/20/16; he had Hydrofera Blue prescribed last week however he didn't have this one reason or another and he's been putting silver collagen on this. He arrives in clinic today extremely hypertensive. I had given him blood pressure medication myself about a month ago which he appears to be taking intermittently by his own admission. We also suggested primary physician's and gave him a list of options he doesn't seem to a followed through with any of this 10/27/16; using silver collagen. Once again the circumference of the wound appears macerated. Requires debridement. I'll change to silver alginate. 11/03/16; the patient is  using silver alginate as of last week. Small wound with healthy granulation. He has run out of his blood pressure medications although his blood pressure was 141/90 today. 11/17/16; patient is using silver alginate. Patient arrives today with smaller looking  wound although was some depth. He had an appointment with his primary care but had to cancel it last week 11/24/16; wound on the right lateral fifth metatarsal head. Small open area last week that I used Iodosorb ointment 4 with a hope of closing this down. He arrives today with a small open wound but with a considerable degree of undermining. Post debridement change to [facility] 12/08/16; the patient arrives today with a wound on the right lateral fifth metatarsal head in a healed state. When he was last here I debrided the undermining here to a healthy surface and he seems to of healed over in the last 2 weeks. There is a nice viable surface here. He is concerned about his job vis--vis active on his feet. I advised him to keep this area protected in issues. Initially this started as a blister that became secondarily infected or infection may have been the primary issue am not really certain. He is a type II diabetic READMISSION 12/28/16; I heal this patient out a little over 2 weeks ago. He states that the wound reopened on 12/24/16. He was in the ER yesterday. His lab work was reasonably unremarkable other than a glucose of over 300. A culture of the wound was done. X-ray showed suggestion of early osteomyelitis about the fifth digit proximal phalanx which might represent early osteomyelitis. I believe he was given IV antibiotics and discharged on Augmentin twice a day for 2 weeks. A culture was done that is pending. He tells me he is only on metformin for his diabetes. He has poorly controlled hypertension which we also noted when he was in the clinic last time he has an appointment with primary care on 01/10/17. ABI was 1.29 in this clinic last time He had a mildly elevated C-reactive protein his sedimentation rate was only in the 20s white count was normal 01/05/17; the wound itself does not look ominous today. Using silver alginate. Culture I did last week showed no growth although he did  have group B strep in the ER. He is completing the Augmentin they prescribed. He has not yet had a call for his MRI 01/12/17; the patient's MRI essentially showed the same thing as his x-ray that is a soft tissue ulcer over the lateral aspect of the fifth MTP with surrounding cellulitis and a small area of cortical irregularity along the lateral base of the fifth proximal phalanx with mild marrow edema and minimal enhancement suggesting a small focus of osteomyelitis versus reactive marrow changes secondary to adjacent inflammation. A small focus of osteomyelitis was favored. The patient is completing 2 weeks of Augmentin this will need to be extended. he is using silver alginate on the wound 01/26/18; patient is picked up his final 2 weeks of Augmentin. Wound is smaller and dimensions down. Still using silver alginate 02/02/17; 2 final weeks of Augmentin which is different from what I stated last week. He says he has one more refill. Would be reasonable to complete this. He started the Augmentin at the beginning of November when he was in the emergency room I continue it for a total of 6 weeks. His wound is basically closed over at this point although I am not completely sure of the viability of the surface  and I would like to continue to dress this for a further week at least The patient stubbed his toe while playing with his children he has a small open area on the tip of the fifth toeon the right. This is small and superficial 02/09/17 right 5th met head. This was a re-occurrence after being healed once. I treated him with oral Augmenting for a small area of underlying osteomyellits He is completing this.His wound is closed READMISSION 06/03/2019 This is a now 39 year old man with type 2 diabetes and a history of peripheral neuropathy. We had them here on 2 occasions in 2018 with a wound on the lateral part of the fifth metatarsal head. He was here for a period of time from July to October 2018 and  was healed out however he rapidly broke down. Was discovered to have osteomyelitis in this area I gave him empiric Augmentin this close note closed over and he has not had trouble in this area since. He says roughly 2 months ago at work he got his feet wet and he developed a blister and since then he has had a wound on the right lateral foot he has been using Neosporin on this. 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 Past medical history; type 2 diabetes with peripheral neuropathy, hypertension and a history of atrial fibrillation. He thinks his last hemoglobin A1c was in the sevens range. His ABI in our Clinic was noncompressible 4/15; wound is measuring smaller. We are using silver collagen K-Y jelly and bordered foam. He is using sandals when he is on his own time and padding this religiously and his work boots at work. 4/29; wound continues to contract and surface area we have been using some moistened silver collagen and border foam he has been changing this. He wears sandals on his own time. 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 06/26/2019 8:45 A M Medical Record Number: [id number] Patient Account Number: [id number] Date of Birth/Sex: Treating RN: Feb 01, 1981 (39 y.o. [name] Primary Care Provider: PA Haig Prophet, NO Other Clinician: Referring Provider: Treating Provider/Extender: Silvano Rusk, Designated Weeks in Treatment: 3 Constitutional Patient is hypertensive. Not taking his medications. Pulse regular and within target range for patient.Marland Kitchen Respirations regular, non-labored and within  target range.. Temperature is normal and within the target range for the patient.Marland Kitchen Appears in no distress. Cardiovascular Pedal pulses are intact. Integumentary (Hair, Skin) No erythema around the wound no evidence of infection. Notes Wound exam; the areas on the right lateral foot just proximal to the fifth metatarsal head. This is smaller. Under illumination the wound bed appears clean. There is no undermining and no surrounding erythema. No debridement is necessary Electronic Signature(s) Signed: 06/26/2019 5:41:26 PM By: Linton Ham MD Entered By: Linton Ham on 06/26/2019 09:27:42 -------------------------------------------------------------------------------- Physician Orders Details Patient Name: Date of Service: Mike Bowen. 06/26/2019 8:45 A M Medical Record Number: [id number] Patient Account Number: [id number] Date of Birth/Sex: Treating RN: [birth year] (39 y.o. [name] Primary Care Provider: PA Haig Prophet, NO Other Clinician: Referring Provider: Treating Provider/Extender: Silvano Rusk, Designated Weeks in Treatment: 3 Verbal / Phone Orders: No Diagnosis Coding ICD-10 Coding Code Description E11.621 Type 2 diabetes  mellitus with foot ulcer L97.512 Non-pressure chronic ulcer of other part of right foot with fat layer exposed E11.42 Type 2 diabetes mellitus with diabetic polyneuropathy Follow-up Appointments ppointment in 2 weeks. - Dr. Dellia Nims Return A Dressing Change Frequency Other: - 2 times per week Wound Cleansing May shower and wash wound with soap and water. 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 06/26/2019 8:45 A M Medical Record Number: [id number] Patient Account Number: [id number] Date of Birth/Sex: Treating RN: 05-Jun-1980 (39 y.o. [name] Primary Care Provider: PA Haig Prophet, NO Other Clinician: Referring Provider: Treating Provider/Extender: Silvano Rusk, Designated Weeks in Treatment: 3 Active Problems ICD-10 Encounter Code Description Active Date MDM Diagnosis E11.621 Type 2 diabetes mellitus with foot ulcer 06/03/2019 No Yes L97.512 Non-pressure chronic ulcer of other part of right foot with fat layer exposed 06/03/2019 No Yes E11.42 Type 2 diabetes mellitus with diabetic polyneuropathy 06/03/2019 No Yes Inactive Problems Resolved Problems Electronic Signature(s) Signed: 06/26/2019 5:41:26 PM By: Linton Ham MD Entered By: Linton Ham on 06/26/2019 09:24:35 -------------------------------------------------------------------------------- Progress Note Details Patient Name: Date of Service: Mike Bowen. 06/26/2019 8:45 A M Medical Record Number: [id number] Patient Account Number: [id number] Date of Birth/Sex: Treating RN: 1980-05-29 (39 y.o. [name] Primary Care Provider: PA Haig Prophet, NO Other Clinician: Referring Provider: Treating Provider/Extender: Silvano Rusk, Designated Weeks in Treatment: 3 Subjective History of Present Illness (HPI) 09/01/16; this is a 39 year old man with type 2 diabetes on oral agents. It is probable that his diabetes is not under good control although there has not been a recent hemoglobin A1c. He was in the ER on 06/25/16 with a glucose of 418. He does not have primary care. He is a nonsmoker. He has no known history of diabetic PAD or neuropathy. The patient states that 2 weeks ago he noted a blister on  his right lateral fifth metatarsal head. This became erythematous and painful. He was seen in the ER on 08/24/16 and noted to have an ulcer. Culture of this grew methicillin sensitive staph aureus and he is on 10 days of doxycycline starting on 6/30. X-ray at the time was negative for bony abnormality. He does not have a prior history of diabetic wounds. He has not been systemically unwell. Lab work on 6/28 showed a white count of 10 with 80% neutrophils. Comprehensive metabolic panel was normal other than an albumin of 3.4. BUN and creatinine were normal CO2 was 28. 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 Once again significant tunneling inferiorly. Surrounding tissue is thick and almost the plastic-like consistency. Extensive debridement done. No obvious infection is seen in the area. 09/15/16; difficult wound on the lateral aspect of the right fifth metatarsal head. There is some tunneling to this inferiorly. Aggressive debridement I did last week seems to of left a healthy granulated wound bed. There is no obvious infection. We  have been using silver collagen. 09/22/16; Arrives with wound no better. Surrounding callous and non viable tissue. silver collagen 09/29/16 on evaluation today patient presents for evaluation of his right fifth metatarsal head ulcer. Fortunately there's no evidence of erythema surrounding infection and there is no purulent discharge at this point. Patient tells me that he is having no pain currently. He has no nausea, vomiting, diarrhea that would indicate a systemic infection. Overall things seem to be doing well although he had not been checking his blood sugars and therefore he cannot tell me how these have been running due to the fact that he had not yet gotten strips. Being out of work secondary to this ulcer has  really caused him a lot of financial hardship. 10/06/2016 -- he has brought some papers from his workplace regarding disability and I have signed off on these today 10/20/16; he had Hydrofera Blue prescribed last week however he didn't have this one reason or another and he's been putting silver collagen on this. He arrives in clinic today extremely hypertensive. I had given him blood pressure medication myself about a month ago which he appears to be taking intermittently by his own admission. We also suggested primary physician's and gave him a list of options he doesn't seem to a followed through with any of this 10/27/16; using silver collagen. Once again the circumference of the wound appears macerated. Requires debridement. I'll change to silver alginate. 11/03/16; the patient is using silver alginate as of last week. Small wound with healthy granulation. He has run out of his blood pressure medications although his blood pressure was 141/90 today. 11/17/16; patient is using silver alginate. Patient arrives today with smaller looking wound although was some depth. He had an appointment with his primary care but had to cancel it last week 11/24/16; wound on the right lateral fifth metatarsal head. Small open area last week that I used Iodosorb ointment 4 with a hope of closing this down. He arrives today with a small open wound but with a considerable degree of undermining. Post debridement change to [facility] 12/08/16; the patient arrives today with a wound on the right lateral fifth metatarsal head in a healed state. When he was last here I debrided the undermining here to a healthy surface and he seems to of healed over in the last 2 weeks. There is a nice viable surface here. He is concerned about his job vis--vis active on his feet. I advised him to keep this area protected in issues. Initially this started as a blister that became secondarily infected or infection may have been the primary  issue am not really certain. He is a type II diabetic READMISSION 12/28/16; I heal this patient out a little over 2 weeks ago. He states that the wound reopened on 12/24/16. He was in the ER yesterday. His lab work was reasonably unremarkable other than a glucose of over 300. A culture of the wound was done. X-ray showed suggestion of early osteomyelitis about the fifth digit proximal phalanx which might represent early osteomyelitis. I believe he was given IV antibiotics and discharged on Augmentin twice a day for 2 weeks. A culture was done that is pending. He tells me he is only on metformin for his diabetes. He has poorly controlled hypertension which we also noted when he was in the clinic last time he has an appointment with primary care on 01/10/17. ABI was 1.29 in this clinic last time He had a mildly elevated C-reactive  protein his sedimentation rate was only in the 20s white count was normal 01/05/17; the wound itself does not look ominous today. Using silver alginate. Culture I did last week showed no growth although he did have group B strep in the ER. He is completing the Augmentin they prescribed. He has not yet had a call for his MRI 01/12/17; the patient's MRI essentially showed the same thing as his x-ray that is a soft tissue ulcer over the lateral aspect of the fifth MTP with surrounding cellulitis and a small area of cortical irregularity along the lateral base of the fifth proximal phalanx with mild marrow edema and minimal enhancement suggesting a small focus of osteomyelitis versus reactive marrow changes secondary to adjacent inflammation. A small focus of osteomyelitis was favored. The patient is completing 2 weeks of Augmentin this will need to be extended. he is using silver alginate on the wound 01/26/18; patient is picked up his final 2 weeks of Augmentin. Wound is smaller and dimensions down. Still using silver alginate 02/02/17; 2 final weeks of Augmentin which is different  from what I stated last week. He says he has one more refill. Would be reasonable to complete this. He started the Augmentin at the beginning of November when he was in the emergency room I continue it for a total of 6 weeks. His wound is basically closed over at this point although I am not completely sure of the viability of the surface and I would like to continue to dress this for a further week at least The patient stubbed his toe while playing with his children he has a small open area on the tip of the fifth toeon the right. This is small and superficial 02/09/17 right 5th met head. This was a re-occurrence after being healed once. I treated him with oral Augmenting for a small area of underlying osteomyellits He is completing this.His wound is closed READMISSION 06/03/2019 This is a now 39 year old man with type 2 diabetes and a history of peripheral neuropathy. We had them here on 2 occasions in 2018 with a wound on the lateral part of the fifth metatarsal head. He was here for a period of time from July to October 2018 and was healed out however he rapidly broke down. Was discovered to have osteomyelitis in this area I gave him empiric Augmentin this close note closed over and he has not had trouble in this area since. He says roughly 2 months ago at work he got his feet wet and he developed a blister and since then he has had a wound on the right lateral foot he has been using Neosporin on this. 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 Past medical history; type 2 diabetes with peripheral neuropathy, hypertension and a history of atrial fibrillation. He thinks his last hemoglobin A1c was in the sevens range. His ABI in our Clinic was noncompressible 4/15; wound is measuring smaller. We are using silver collagen K-Y jelly and bordered foam. He is using sandals when he is on his own time and  padding this religiously and his work boots at work. 4/29; wound continues to contract and surface area we have been using some moistened silver collagen and border foam he has been changing this. He wears sandals on his own time. He has callus pads and padding for his work boots Objective Constitutional Patient is hypertensive. Not taking his medications.  Pulse regular and within target range for patient.Marland Kitchen Respirations regular, non-labored and within target range.. Temperature is normal and within the target range for the patient.Marland Kitchen Appears in no distress. Vitals Time Taken: 9:07 AM, Height: 72 in, Weight: 256 lbs, BMI: 34.7, Temperature: 98.1 F, Pulse: 79 bpm, Respiratory Rate: 18 breaths/min, Blood Pressure: 173/105 mmHg, Capillary Blood Glucose: 107 mg/dl. General Notes: glucose per pt report. BP elevated, pt states he has not take BP meds in 2 days due to meds locked in car. Cardiovascular Pedal pulses are intact. General Notes: Wound exam; the areas on the right lateral foot just proximal to the fifth metatarsal head. This is smaller. Under illumination the wound bed appears clean. There is no undermining and no surrounding erythema. No debridement is necessary Integumentary (Hair, Skin) No erythema around the wound no evidence of infection. Wound #3 status is Open. Original cause of wound was Blister. The wound is located on the Right,Lateral Foot. The wound measures 0.7cm length x 0.3cm width x 0.1cm depth; 0.165cm^2 area and 0.016cm^3 volume. There is Fat Layer (Subcutaneous Tissue) Exposed exposed. There is no tunneling or undermining noted. There is a small amount of serosanguineous drainage noted. The wound margin is flat and intact. There is large (67-100%) pink granulation within the wound bed. There is no necrotic tissue within the wound bed. Assessment Active Problems ICD-10 Type 2 diabetes mellitus with foot ulcer Non-pressure chronic ulcer of other part of right foot with  fat layer exposed Type 2 diabetes mellitus with diabetic polyneuropathy Plan Follow-up Appointments: Return Appointment in 2 weeks. - Dr. Dellia Nims Dressing Change Frequency: Other: - 2 times per week Wound Cleansing: May shower and wash wound with soap and water. - on days dressing ordered to be changed , all others shower with protection, area can not get wet on those days Primary Wound Dressing: Silver Collagen - moisten with hydrogel Secondary Dressing: Foam Border - area to be padded with gauze on days patient works 1. Silver collagen moistened with hydrogel to continue 2. The patient appears to be religiously offloading this both in his off time and at work or offloading it as best he can. The wound is getting better 3. No evidence of infection Electronic Signature(s) Signed: 06/26/2019 5:41:26 PM By: Linton Ham MD Signed: 06/26/2019 5:41:26 PM By: Linton Ham MD Entered By: Linton Ham on 06/26/2019 09:28:18 -------------------------------------------------------------------------------- SuperBill Details Patient Name: Date of Service: Mike Bowen 06/26/2019 Medical Record Number: [id number] Patient Account Number: [id number] Date of Birth/Sex: Treating RN: [birth year] (39 y.o. 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

## 2019-07-10 ENCOUNTER — Encounter (HOSPITAL_BASED_OUTPATIENT_CLINIC_OR_DEPARTMENT_OTHER): Payer: Self-pay | Admitting: Internal Medicine

## 2019-07-14 ENCOUNTER — Ambulatory Visit (HOSPITAL_BASED_OUTPATIENT_CLINIC_OR_DEPARTMENT_OTHER): Payer: Self-pay | Admitting: Internal Medicine

## 2019-07-17 ENCOUNTER — Encounter (HOSPITAL_BASED_OUTPATIENT_CLINIC_OR_DEPARTMENT_OTHER): Payer: Self-pay | Admitting: Internal Medicine

## 2019-07-21 ENCOUNTER — Encounter (HOSPITAL_BASED_OUTPATIENT_CLINIC_OR_DEPARTMENT_OTHER): Payer: 59 | Attending: Internal Medicine | Admitting: Internal Medicine

## 2019-07-21 DIAGNOSIS — Z7984 Long term (current) use of oral hypoglycemic drugs: Secondary | ICD-10-CM | POA: Diagnosis not present

## 2019-07-21 DIAGNOSIS — E1142 Type 2 diabetes mellitus with diabetic polyneuropathy: Secondary | ICD-10-CM | POA: Diagnosis not present

## 2019-07-21 DIAGNOSIS — L97512 Non-pressure chronic ulcer of other part of right foot with fat layer exposed: Secondary | ICD-10-CM | POA: Insufficient documentation

## 2019-07-21 DIAGNOSIS — E11621 Type 2 diabetes mellitus with foot ulcer: Secondary | ICD-10-CM | POA: Diagnosis not present

## 2019-07-21 DIAGNOSIS — I10 Essential (primary) hypertension: Secondary | ICD-10-CM | POA: Diagnosis not present

## 2019-07-21 NOTE — Progress Notes (Signed)
Bowen, Mike L. ([id number]) Visit Report for 06/12/2019 Arrival Information Details Patient Name: Date of Service: Mike Bowen 06/12/2019 8:30 A M Medical Record Number: [id number] Patient Account Number: [id number] Date of Birth/Sex: Treating RN: 1980/04/07 (39 y.o. [name] Primary Care Mike Bowen: PA Mike Bowen, NO Other Clinician: Referring Mike Bowen: Treating Mike Bowen/Extender: Mike Bowen, Designated Weeks in Treatment: 1 Visit Information History Since Last Visit Added or deleted any medications: No Patient Arrived: Ambulatory Any new allergies or adverse reactions: No Arrival Time: 08:38 Had a fall or experienced change in No Accompanied By: self activities of daily living that may affect Transfer Assistance: None risk of falls: Patient Identification Verified: Yes Signs or symptoms of abuse/neglect since last visito No Secondary Verification Process Completed: Yes Hospitalized since last visit: No Patient Requires Transmission-Based Precautions: No Implantable device outside of the clinic excluding No Patient Has Alerts: No cellular tissue based products placed in the center since last visit: Has Dressing in Place as Prescribed: Yes Pain Present Now: No Electronic Signature(s) Signed: 06/13/2019 4:21:34 PM By: Mike Bowen Entered By: Mike Bowen on 06/12/2019 08:38:24 -------------------------------------------------------------------------------- Clinic Level of Care Assessment Details Patient Name: Date of Service: Mike Bowen 06/12/2019 8:30 A M Medical Record Number: [id number] Patient Account Number: [id number] Date of Birth/Sex: Treating RN: 09/24/1980 (39 y.o. Mike Bowen Primary Care Mike Bowen: PA TIENT, NO Other Clinician: Referring Mike Bowen: Treating Mike Bowen/Extender: Mike Bowen, Designated Weeks in Treatment: 1 Clinic Level of Care Assessment Items TOOL 4 Quantity Score X- 1 0 Use when only an  EandM is performed on FOLLOW-UP visit ASSESSMENTS - Nursing Assessment / Reassessment X- 1 10 Reassessment of Co-morbidities (includes updates in patient status) X- 1 5 Reassessment of Adherence to Treatment Plan ASSESSMENTS - Wound and Skin A ssessment / Reassessment X - Simple Wound Assessment / Reassessment - one wound 1 5 []  - 0 Complex Wound Assessment / Reassessment - multiple wounds X- 1 10 Dermatologic / Skin Assessment (not related to wound area) ASSESSMENTS - Focused Assessment X- 1 5 Circumferential Edema Measurements - multi extremities X- 1 10 Nutritional Assessment / Counseling / Intervention []  - 0 Lower Extremity Assessment (monofilament, tuning fork, pulses) []  - 0 Peripheral Arterial Disease Assessment (using hand held doppler) ASSESSMENTS - Ostomy and/or Continence Assessment and Care []  - 0 Incontinence Assessment and Management []  - 0 Ostomy Care Assessment and Management (repouching, etc.) PROCESS - Coordination of Care X - Simple Patient / Family Education for ongoing care 1 15 []  - 0 Complex (extensive) Patient / Family Education for ongoing care X- 1 10 Staff obtains , Records, T Results / Process Orders est []  - 0 Staff telephones HHA, Nursing Homes / Clarify orders / etc []  - 0 Routine Transfer to another Facility (non-emergent condition) []  - 0 Routine Hospital Admission (non-emergent condition) []  - 0 New Admissions / / Ordering NPWT Apligraf, etc. , []  - 0 Emergency Hospital Admission (emergent condition) X- 1 10 Simple Discharge Coordination []  - 0 Complex (extensive) Discharge Coordination PROCESS - Special Needs []  - 0 Pediatric / Minor Patient Management []  - 0 Isolation Patient Management []  - 0 Hearing / Language / Visual special needs []  - 0 Assessment of Community assistance (transportation, D/C planning, etc.) []  - 0 Additional assistance / Altered mentation []  - 0 Support Surface(s)  Assessment (bed, cushion, seat, etc.) INTERVENTIONS - Wound Cleansing / Measurement X - Simple Wound Cleansing - one wound 1 5 []  - 0  Complex Wound Cleansing - multiple wounds X- 1 5 Wound Imaging (photographs - any number of wounds) []  - 0 Wound Tracing (instead of photographs) X- 1 5 Simple Wound Measurement - one wound []  - 0 Complex Wound Measurement - multiple wounds INTERVENTIONS - Wound Dressings X - Small Wound Dressing one or multiple wounds 1 10 []  - 0 Medium Wound Dressing one or multiple wounds []  - 0 Large Wound Dressing one or multiple wounds []  - 0 Application of Medications - topical []  - 0 Application of Medications - injection INTERVENTIONS - Miscellaneous []  - 0 External ear exam []  - 0 Specimen Collection (cultures, biopsies, blood, body fluids, etc.) []  - 0 Specimen(s) / Culture(s) sent or taken to Lab for analysis []  - 0 Patient Transfer (multiple staff / / Similar devices) []  - 0 Simple Staple / Suture removal (25 or less) []  - 0 Complex Staple / Suture removal (26 or more) []  - 0 Hypo / Hyperglycemic Management (close monitor of Blood Glucose) []  - 0 Ankle / Brachial Index (ABI) - do not check if billed separately X- 1 5 Vital Signs Has the patient been seen at the hospital within the last three years: Yes Total Score: 110 Level Of Care: New/Established - Level 3 Electronic Signature(s) Signed: 06/13/2019 4:22:27 PM By: RN, BSN Entered By: on 06/12/2019 09:06:00 -------------------------------------------------------------------------------- Encounter Discharge Information Details Patient Name: Date of Service: L. 06/12/2019 8:30 A M Medical Record Number: Patient Account Number: Date of Birth/Sex: Treating RN: [birth year] (39 y.o. Primary Care Mike Bowen: PA , NO Other Clinician: Referring Mike Bowen: Treating Mike Bowen/Extender: , Designated Weeks in Treatment: 1 Encounter Discharge Information Items Discharge Condition: Stable Ambulatory Status: Ambulatory Discharge Destination: Home Transportation: Private Auto Accompanied By: self Schedule Follow-up Appointment: Yes Clinical Summary of Care: Electronic Signature(s) Signed: 06/13/2019 4:22:27 PM By: 06/15/2019 RN, BSN Entered By: Zandra Abts on 06/12/2019 09:07:01 -------------------------------------------------------------------------------- Lower Extremity Assessment Details Patient Name: Date of Service: 06/14/2019. 06/12/2019 8:30 A M Medical Record Number: 06/14/2019 Patient Account Number: [id number] Date of Birth/Sex: Treating RN: 1980-05-05 (39 y.o. 24 Primary Care Torrian Canion: PA Mike Bowen, NO Other Clinician: Referring Louden Houseworth: Treating Kaleeah Gingerich/Extender: Mike Bowen None, Designated Weeks in Treatment: 1 Edema Assessment Assessed: [Left: No] [Right: No] Edema: [Left: N] [Right: o] Calf Left: Right: Point of Measurement: cm From Medial Instep cm 42.5 cm Ankle Left: Right: Point of Measurement: cm From Medial Instep cm 28.5 cm Vascular Assessment Pulses: Dorsalis Pedis Palpable: [Right:Yes] Electronic Signature(s) Signed: 06/13/2019 4:22:27 PM By: 06/15/2019 RN, BSN Entered By: Zandra Abts on 06/12/2019 08:50:17 -------------------------------------------------------------------------------- Multi Wound Chart Details Patient Name: Date of Service: 06/14/2019 L. 06/12/2019 8:30 A M Medical Record Number: 06/14/2019 Patient Account Number: [id number] Date of Birth/Sex: Treating RN: 04/24/1980 (39 y.o. 24 Primary Care Yoali Conry: PA Mike Bowen, NO Other Clinician: Referring Zaylin Pistilli: Treating Daiya Tamer/Extender: Mike Bowen None, Designated Weeks in Treatment: 1 Vital Signs Height(in): 72 Pulse(bpm): 80 Weight(lbs): 256 Blood Pressure(mmHg): 172/99 Body Mass  Index(BMI): 35 Temperature(F): 98.3 Respiratory Rate(breaths/min): 17 Photos: [3:No Photos Right Foot] [N/A:N/A N/A] Wound Location: [3:Blister] [N/A:N/A] Wounding Event: [3:Diabetic Wound/Ulcer of the Lower] [N/A:N/A] Primary Etiology: [3:Extremity Hypertension, Type II Diabetes,] [N/A:N/A] Comorbid History: [3:Neuropathy 04/02/2019] [N/A:N/A] Date Acquired: [3:1] [N/A:N/A] Weeks of Treatment: [3:Open] [N/A:N/A] Wound Status: [3:1x0.5x0.1] [N/A:N/A] Measurements L x W x D (cm) [3:0.393] [N/A:N/A] A (cm) :  rea [3:0.039] [N/A:N/A] Volume (cm) : [3:60.90%] [N/A:N/A] % Reduction in A rea: [3:61.40%] [N/A:N/A] % Reduction in Volume: [3:Grade 2] [N/A:N/A] Classification: [3:Small] [N/A:N/A] Exudate A mount: [3:Serous] [N/A:N/A] Exudate Type: [3:amber] [N/A:N/A] Exudate Color: [3:Flat and Intact] [N/A:N/A] Wound Margin: [3:Large (67-100%)] [N/A:N/A] Granulation A mount: [3:Pink] [N/A:N/A] Granulation Quality: [3:None Present (0%)] [N/A:N/A] Necrotic A mount: [3:Fat Layer (Subcutaneous Tissue)] [N/A:N/A] Exposed Structures: [3:Exposed: Yes Fascia: No Tendon: No Muscle: No Joint: No Bone: No Medium (34-66%)] [N/A:N/A] Treatment Notes Electronic Signature(s) Signed: 06/14/2019 6:55:41 AM By: Linton Ham MD Signed: 07/21/2019 1:31:05 PM By: Deon Pilling Entered By: Linton Ham on 06/12/2019 09:06:00 -------------------------------------------------------------------------------- Multi-Disciplinary Care Plan Details Patient Name: Date of Service: Mike Lima L. 06/12/2019 8:30 A M Medical Record Number: [id number] Patient Account Number: [id number] Date of Birth/Sex: Treating RN: 10/08/1980 (39 y.o. Janyth Contes Primary Care Shon Mansouri: PA Haig Prophet, NO Other Clinician: Referring Dandra Velardi: Treating Margery Szostak/Extender: Silvano Rusk, Designated Weeks in Treatment: 1 Active Inactive Nutrition Nursing Diagnoses: Impaired glucose control: actual or  potential Goals: Patient/caregiver verbalizes understanding of need to maintain therapeutic glucose control per primary care physician Date Initiated: 06/03/2019 Target Resolution Date: 07/04/2019 Goal Status: Active Interventions: Assess HgA1c results as ordered upon admission and as needed Assess patient nutrition upon admission and as needed per policy Notes: Wound/Skin Impairment Nursing Diagnoses: Knowledge deficit related to ulceration/compromised skin integrity Goals: Patient/caregiver will verbalize understanding of skin care regimen Date Initiated: 06/03/2019 Target Resolution Date: 07/04/2019 Goal Status: Active Ulcer/skin breakdown will have a volume reduction of 30% by week 4 Date Initiated: 06/03/2019 Target Resolution Date: 07/04/2019 Goal Status: Active Interventions: Assess patient/caregiver ability to obtain necessary supplies Assess patient/caregiver ability to perform ulcer/skin care regimen upon admission and as needed Assess ulceration(s) every visit Notes: Electronic Signature(s) Signed: 06/13/2019 4:22:27 PM By: Levan Hurst RN, BSN Entered By: Levan Hurst on 06/12/2019 08:41:24 -------------------------------------------------------------------------------- Pain Assessment Details Patient Name: Date of Service: Mike Lima L. 06/12/2019 8:30 A M Medical Record Number: [id number] Patient Account Number: [id number] Date of Birth/Sex: Treating RN: 05-13-1980 (39 y.o. Marvis Repress Primary Care Seven [place]: PA Haig Prophet, NO Other Clinician: Referring Quiana Cobaugh: Treating Mel Tadros/Extender: Silvano Rusk, Designated Weeks in Treatment: 1 Active Problems Location of Pain Severity and Description of Pain Patient Has Paino No Site Locations Pain Management and Medication Current Pain Management: Electronic Signature(s) Signed: 06/13/2019 4:21:34 PM By: Kela Millin Entered By: Kela Millin on 06/12/2019  08:39:32 -------------------------------------------------------------------------------- Patient/Caregiver Education Details Patient Name: Date of Service: Mike Bowen 4/15/2021andnbsp8:30 [place] Record Number: [id number] Patient Account Number: [id number] Date of Birth/Gender: Treating RN: [birth year] (39 y.o. Janyth Contes Primary Care Physician: PA Haig Prophet, NO Other Clinician: Referring Physician: Treating Physician/Extender: Silvano Rusk, Designated Weeks in Treatment: 1 Education Assessment Education Provided To: Patient Education Topics Provided Wound/Skin Impairment: Handouts: Skin Care Do's and Dont's Methods: Explain/Verbal Responses: Reinforcements needed Electronic Signature(s) Signed: 06/13/2019 4:22:27 PM By: Levan Hurst RN, BSN Entered By: Levan Hurst on 06/12/2019 08:41:35 -------------------------------------------------------------------------------- Wound Assessment Details Patient Name: Date of Service: Mike Bowen. 06/12/2019 8:30 A M Medical Record Number: [id number] Patient Account Number: [id number] Date of Birth/Sex: Treating RN: 09/15/1980 (39 y.o. Marvis Repress Primary Care Zinnia Tindall: PA Haig Prophet, NO Other Clinician: Referring Azariah Bonura: Treating Servando Kyllonen/Extender: Linton Ham None, Designated Weeks in Treatment: 1 Wound Status Wound Number: 3 Primary Etiology: Diabetic Wound/Ulcer of the Lower Extremity Wound Location: Right Foot Wound Status: Open Wounding Event: Blister Comorbid History: Hypertension,  Type II Diabetes, Neuropathy Date Acquired: 04/02/2019 Weeks Of Treatment: 1 Clustered Wound: No Photos Wound Measurements Length: (cm) 1 Width: (cm) 0.5 Depth: (cm) 0.1 Area: (cm) 0.393 Volume: (cm) 0.039 % Reduction in Area: 60.9% % Reduction in Volume: 61.4% Epithelialization: Medium (34-66%) Tunneling: No Undermining: No Wound Description Classification: Grade 2 Wound Margin: Flat and  Intact Exudate Amount: Small Exudate Type: Serous Exudate Color: amber Foul Odor After Cleansing: No Slough/Fibrino Yes Wound Bed Granulation Amount: Large (67-100%) Exposed Structure Granulation Quality: Pink Fascia Exposed: No Necrotic Amount: None Present (0%) Fat Layer (Subcutaneous Tissue) Exposed: Yes Tendon Exposed: No Muscle Exposed: No Joint Exposed: No Bone Exposed: No Electronic Signature(s) Signed: 06/13/2019 4:21:34 PM By: Mike Bowen Signed: 06/17/2019 1:29:28 PM By: Karl Ito Entered By: Karl Ito on 06/12/2019 14:37:21 -------------------------------------------------------------------------------- Vitals Details Patient Name: Date of Service: Mike Brim L. 06/12/2019 8:30 A M Medical Record Number: [id number] Patient Account Number: [id number] Date of Birth/Sex: Treating RN: April 21, 1980 (39 y.o. [name] Primary Care Tayten Heber: PA Mike Bowen, NO Other Clinician: Referring Sly Parlee: Treating Wenzel Backlund/Extender: Mike Bowen, Designated Weeks in Treatment: 1 Vital Signs Time Taken: 08:35 Temperature (F): 98.3 Height (in): 72 Pulse (bpm): 80 Weight (lbs): 256 Respiratory Rate (breaths/min): 17 Body Mass Index (BMI): 34.7 Blood Pressure (mmHg): 172/99 Reference Range: 80 - 120 mg / dl Notes patient stated he has not checked his CBG this Bowen Electronic Signature(s) Signed: 06/13/2019 4:21:34 PM By: Mike Bowen Entered By: Mike Bowen on 06/12/2019 04:88:89

## 2019-07-21 NOTE — Progress Notes (Signed)
Khanna, Jacobe L. ([id number]) Visit Report for 07/21/2019 Arrival Information Details Patient Name: Date of Service: Mike Bowen 07/21/2019 8:45 A M Medical Record Number: [id number] Patient Account Number: [id number] Date of Birth/Sex: Treating RN: 05-08-1980 (39 y.o. [name] Primary Care Zayyan Mullen: PA TIENT, NO Other Clinician: Referring Alaa Eyerman: Treating Adrieana Fennelly/Extender: Cassandria Anger None, Designated Weeks in Treatment: 6 Visit Information History Since Last Visit Added or deleted any medications: No Patient Arrived: Ambulatory Any new allergies or adverse reactions: No Arrival Time: 09:22 Had a fall or experienced change in No Accompanied By: self activities of daily living that may affect Transfer Assistance: None risk of falls: Patient Requires Transmission-Based Precautions: No Signs or symptoms of abuse/neglect since last visito No Patient Has Alerts: No Hospitalized since last visit: No Implantable device outside of the clinic excluding No cellular tissue based products placed in the center since last visit: Has Dressing in Place as Prescribed: Yes Pain Present Now: No Electronic Signature(s) Signed: 07/21/2019 4:34:07 PM By: Cherylin Mylar Entered By: Cherylin Mylar on 07/21/2019 09:23:57 -------------------------------------------------------------------------------- Clinic Level of Care Assessment Details Patient Name: Date of Service: Mike Bowen 07/21/2019 8:45 A M Medical Record Number: [id number] Patient Account Number: [id number] Date of Birth/Sex: Treating RN: Sep 12, 1980 (39 y.o. [name] Primary Care Lei Dower: PA TIENT, NO Other Clinician: Referring Gracielynn Birkel: Treating Asma Boldon/Extender: Cassandria Anger None, Designated Weeks in Treatment: 6 Clinic Level of Care Assessment Items TOOL 4 Quantity Score X- 1 0 Use when only an EandM is performed on FOLLOW-UP visit ASSESSMENTS - Nursing Assessment /  Reassessment X- 1 10 Reassessment of Co-morbidities (includes updates in patient status) X- 1 5 Reassessment of Adherence to Treatment Plan ASSESSMENTS - Wound and Skin A ssessment / Reassessment X - Simple Wound Assessment / Reassessment - one wound 1 5 []  - 0 Complex Wound Assessment / Reassessment - multiple wounds []  - 0 Dermatologic / Skin Assessment (not related to wound area) ASSESSMENTS - Focused Assessment []  - 0 Circumferential Edema Measurements - multi extremities []  - 0 Nutritional Assessment / Counseling / Intervention X- 1 5 Lower Extremity Assessment (monofilament, tuning fork, pulses) []  - 0 Peripheral Arterial Disease Assessment (using hand held doppler) ASSESSMENTS - Ostomy and/or Continence Assessment and Care []  - 0 Incontinence Assessment and Management []  - 0 Ostomy Care Assessment and Management (repouching, etc.) PROCESS - Coordination of Care X - Simple Patient / Family Education for ongoing care 1 15 []  - 0 Complex (extensive) Patient / Family Education for ongoing care X- 1 10 Staff obtains , Records, T Results / Process Orders est []  - 0 Staff telephones HHA, Nursing Homes / Clarify orders / etc []  - 0 Routine Transfer to another Facility (non-emergent condition) []  - 0 Routine Hospital Admission (non-emergent condition) []  - 0 New Admissions / / Ordering NPWT Apligraf, etc. , []  - 0 Emergency Hospital Admission (emergent condition) X- 1 10 Simple Discharge Coordination []  - 0 Complex (extensive) Discharge Coordination PROCESS - Special Needs []  - 0 Pediatric / Minor Patient Management []  - 0 Isolation Patient Management []  - 0 Hearing / Language / Visual special needs []  - 0 Assessment of Community assistance (transportation, D/C planning, etc.) []  - 0 Additional assistance / Altered mentation []  - 0 Support Surface(s) Assessment (bed, cushion, seat, etc.) INTERVENTIONS - Wound Cleansing /  Measurement X - Simple Wound Cleansing - one wound 1 5 []  - 0 Complex Wound Cleansing - multiple wounds X- 1 5  Wound Imaging (photographs - any number of wounds) []  - 0 Wound Tracing (instead of photographs) X- 1 5 Simple Wound Measurement - one wound []  - 0 Complex Wound Measurement - multiple wounds INTERVENTIONS - Wound Dressings []  - 0 Small Wound Dressing one or multiple wounds []  - 0 Medium Wound Dressing one or multiple wounds []  - 0 Large Wound Dressing one or multiple wounds []  - 0 Application of Medications - topical []  - 0 Application of Medications - injection INTERVENTIONS - Miscellaneous []  - 0 External ear exam []  - 0 Specimen Collection (cultures, biopsies, blood, body fluids, etc.) []  - 0 Specimen(s) / Culture(s) sent or taken to Lab for analysis []  - 0 Patient Transfer (multiple staff / Lift / Similar devices) []  - 0 Simple Staple / Suture removal (25 or less) []  - 0 Complex Staple / Suture removal (26 or more) []  - 0 Hypo / Hyperglycemic Management (close monitor of Blood Glucose) []  - 0 Ankle / Brachial Index (ABI) - do not check if billed separately X- 1 5 Vital Signs Has the patient been seen at the hospital within the last three years: Yes Total Score: 80 Level Of Care: New/Established - Level 3 Electronic Signature(s) Signed: 07/21/2019 5:12:22 PM By: RN, BSN Entered By: on 07/21/2019 10:49:35 -------------------------------------------------------------------------------- Lower Extremity Assessment Details Patient Name: Date of Service: . 07/21/2019 8:45 A M Medical Record Number: Patient Account Number: Date of Birth/Sex: Treating RN: March 31, 1980 (39 y.o. Primary Care Kanyla Omeara: PA Michiel Sites, NO Other Clinician: Referring Deniro Laymon: Treating Chia Mowers/Extender: None, Designated Weeks in Treatment: 6 Edema Assessment Assessed: [Left:  No] [Right: No] Edema: [Left: N] [Right: o] Calf Left: Right: Point of Measurement: cm From Medial Instep cm 42.5 cm Ankle Left: Right: Point of Measurement: cm From Medial Instep cm 28.5 cm Vascular Assessment Pulses: Dorsalis Pedis Palpable: [Right:Yes] Electronic Signature(s) Signed: 07/21/2019 4:34:07 PM By: Entered By: on 07/21/2019 09:24:56 -------------------------------------------------------------------------------- Multi-Disciplinary Care Plan Details Patient Name: Date of Service: Zandra Abts. 07/21/2019 8:45 A M Medical Record Number: 07/23/2019 Patient Account Number: Mike Bowen Date of Birth/Sex: Treating RN: 03/07/1980 (39 y.o. [id number] Primary Care Waylen Depaolo: [place] [id number], NO Other Clinician: Referring Dorethy Tomey: Treating Eriverto Byrnes/Extender: 08/29/1980 None, Designated Weeks in Treatment: 6 Active Inactive Electronic Signature(s) Signed: 07/21/2019 5:12:22 PM By: Zenovia Jordan RN, BSN Entered By: Cassandria Anger on 07/21/2019 10:48:25 -------------------------------------------------------------------------------- Pain Assessment Details Patient Name: Date of Service: Cherylin Mylar 07/21/2019 8:45 A M Medical Record Number: 07/23/2019 Patient Account Number: Mike Bowen Date of Birth/Sex: Treating RN: Jul 13, 1980 (39 y.o. [id number] Primary Care Larnell Granlund: [place] [id number], NO Other Clinician: Referring Katanya Schlie: Treating Haidee Stogsdill/Extender: 08/29/1980 None, Designated Weeks in Treatment: 6 Active Problems Location of Pain Severity and Description of Pain Patient Has Paino No Site Locations Pain Management and Medication Current Pain Management: Electronic Signature(s) Signed: 07/21/2019 4:34:07 PM By: Zenovia Jordan Entered By: Cassandria Anger on 07/21/2019 09:24:48 -------------------------------------------------------------------------------- Patient/Caregiver Education Details Patient  Name: Date of Service: Zandra Abts 5/24/2021andnbsp8:45 A M Medical Record Number: 07/23/2019 Patient Account Number: Mike Bowen Date of Birth/Gender: Treating RN: 20-Jan-1981 (39 y.o. [id number] Primary Care Physician: PA [id number], NO Other Clinician: Referring Physician: Treating Physician/Extender: 08/29/1980 None, Designated Weeks in Treatment: 6 Education Assessment Education Provided To: Patient Education Topics Provided Wound/Skin Impairment: Methods: Explain/Verbal Responses: State content correctly Electronic Signature(s)  Signed: 07/21/2019 5:12:22 PM By: Levan Hurst RN, BSN Entered By: Levan Hurst on 07/21/2019 10:48:37 -------------------------------------------------------------------------------- Wound Assessment Details Patient Name: Date of Service: Delbert Harness 07/21/2019 8:45 A M Medical Record Number: [id number] Patient Account Number: [id number] Date of Birth/Sex: Treating RN: 1980/03/25 (39 y.o. Marvis Repress Primary Care Avilene Marrin: PA TIENT, NO Other Clinician: Referring Ily Denno: Treating Avien Taha/Extender: Tobi Bastos None, Designated Weeks in Treatment: 6 Wound Status Wound Number: 3 Primary Etiology: Diabetic Wound/Ulcer of the Lower Extremity Wound Location: Right, Lateral Foot Wound Status: Open Wounding Event: Blister Comorbid History: Hypertension, Type II Diabetes, Neuropathy Date Acquired: 04/02/2019 Weeks Of Treatment: 6 Clustered Wound: No Photos Photo Uploaded By: Mikeal Hawthorne on 07/21/2019 14:30:09 Wound Measurements Length: (cm) Width: (cm) Depth: (cm) Area: (cm) Volume: (cm) 0 % Reduction in Area: 100% 0 % Reduction in Volume: 100% 0 Epithelialization: Large (67-100%) 0 Tunneling: No 0 Undermining: No Wound Description Classification: Grade 2 Wound Margin: Flat and Intact Exudate Amount: None Present Foul Odor After Cleansing: No Slough/Fibrino No Wound Bed Granulation Amount:  None Present (0%) Exposed Structure Necrotic Amount: None Present (0%) Fascia Exposed: No Fat Layer (Subcutaneous Tissue) Exposed: No Tendon Exposed: No Muscle Exposed: No Joint Exposed: No Bone Exposed: No Electronic Signature(s) Signed: 07/21/2019 4:34:07 PM By: Kela Millin Entered By: Kela Millin on 07/21/2019 09:25:51 -------------------------------------------------------------------------------- Vitals Details Patient Name: Date of Service: [name] 07/21/2019 8:45 A M Medical Record Number: [id number] Patient Account Number: [id number] Date of Birth/Sex: Treating RN: 07-17-1980 (39 y.o. Marvis Repress Primary Care Nakeysha Pasqual: PA TIENT, NO Other Clinician: Referring Doss Cybulski: Treating Mystique Bjelland/Extender: Tobi Bastos None, Designated Weeks in Treatment: 6 Vital Signs Time Taken: 09:20 Temperature (F): 98.1 Height (in): 72 Pulse (bpm): 78 Weight (lbs): 256 Respiratory Rate (breaths/min): 18 Body Mass Index (BMI): 34.7 Blood Pressure (mmHg): 183/99 Reference Range: 80 - 120 mg / dl Notes patient stated he did not check CBG this morning Electronic Signature(s) Signed: 07/21/2019 4:34:07 PM By: Kela Millin Entered By: Kela Millin on 07/21/2019 09:24:42

## 2019-07-21 NOTE — Progress Notes (Signed)
Kester, Libero L. ([id number]) Visit Report for 06/26/2019 Arrival Information Details Patient Name: Date of Service: Mike Bowen 06/26/2019 8:45 A M Medical Record Number: [id number] Patient Account Number: [id number] Date of Birth/Sex: Treating RN: 07-18-1980 (39 y.o. Janyth Contes Primary Care Kaevion Sinclair: PA Haig Prophet, NO Other Clinician: Referring Mira Balon: Treating Shamir Sedlar/Extender: Silvano Rusk, Designated Weeks in Treatment: 3 Visit Information History Since Last Visit Added or deleted any medications: No Patient Arrived: Ambulatory Any new allergies or adverse reactions: No Arrival Time: 09:05 Had a fall or experienced change in No Accompanied By: alone activities of daily living that may affect Transfer Assistance: None risk of falls: Patient Identification Verified: Yes Signs or symptoms of abuse/neglect since last visito No Secondary Verification Process Completed: Yes Hospitalized since last visit: No Patient Requires Transmission-Based Precautions: No Implantable device outside of the clinic excluding No Patient Has Alerts: No cellular tissue based products placed in the center since last visit: Has Dressing in Place as Prescribed: Yes Pain Present Now: No Electronic Signature(s) Signed: 06/26/2019 5:31:56 PM By: Levan Hurst RN, BSN Entered By: Levan Hurst on 06/26/2019 09:05:49 -------------------------------------------------------------------------------- Clinic Level of Care Assessment Details Patient Name: Date of Service: SLA DE, Mike Bowen 06/26/2019 8:45 A M Medical Record Number: [id number] Patient Account Number: [id number] Date of Birth/Sex: Treating RN: 11-Dec-1980 (39 y.o. [name] Primary Care Darryle Dennie: PA Haig Prophet, NO Other Clinician: Referring Camilah Spillman: Treating Deacon Gadbois/Extender: Silvano Rusk, Designated Weeks in Treatment: 3 Clinic Level of Care Assessment Items TOOL 4 Quantity Score X- 1 0 Use when only an EandM  is performed on FOLLOW-UP visit ASSESSMENTS - Nursing Assessment / Reassessment X- 1 10 Reassessment of Co-morbidities (includes updates in patient status) X- 1 5 Reassessment of Adherence to Treatment Plan ASSESSMENTS - Wound and Skin A ssessment / Reassessment X - Simple Wound Assessment / Reassessment - one wound 1 5 '[]'  - 0 Complex Wound Assessment / Reassessment - multiple wounds X- 1 10 Dermatologic / Skin Assessment (not related to wound area) ASSESSMENTS - Focused Assessment X- 1 5 Circumferential Edema Measurements - multi extremities X- 1 10 Nutritional Assessment / Counseling / Intervention '[]'  - 0 Lower Extremity Assessment (monofilament, tuning fork, pulses) '[]'  - 0 Peripheral Arterial Disease Assessment (using hand held doppler) ASSESSMENTS - Ostomy and/or Continence Assessment and Care '[]'  - 0 Incontinence Assessment and Management '[]'  - 0 Ostomy Care Assessment and Management (repouching, etc.) PROCESS - Coordination of Care X - Simple Patient / Family Education for ongoing care 1 15 '[]'  - 0 Complex (extensive) Patient / Family Education for ongoing care X- 1 10 Staff obtains Programmer, systems, Records, T Results / Process Orders est '[]'  - 0 Staff telephones HHA, Nursing Homes / Clarify orders / etc '[]'  - 0 Routine Transfer to another Facility (non-emergent condition) '[]'  - 0 Routine Hospital Admission (non-emergent condition) '[]'  - 0 New Admissions / Biomedical engineer / Ordering NPWT Apligraf, etc. , '[]'  - 0 Emergency Hospital Admission (emergent condition) X- 1 10 Simple Discharge Coordination '[]'  - 0 Complex (extensive) Discharge Coordination PROCESS - Special Needs '[]'  - 0 Pediatric / Minor Patient Management '[]'  - 0 Isolation Patient Management '[]'  - 0 Hearing / Language / Visual special needs '[]'  - 0 Assessment of Community assistance (transportation, D/C planning, etc.) '[]'  - 0 Additional assistance / Altered mentation '[]'  - 0 Support Surface(s)  Assessment (bed, cushion, seat, etc.) INTERVENTIONS - Wound Cleansing / Measurement X - Simple Wound Cleansing - one wound 1 5 '[]'  -  0 Complex Wound Cleansing - multiple wounds X- 1 5 Wound Imaging (photographs - any number of wounds) '[]'  - 0 Wound Tracing (instead of photographs) X- 1 5 Simple Wound Measurement - one wound '[]'  - 0 Complex Wound Measurement - multiple wounds INTERVENTIONS - Wound Dressings X - Small Wound Dressing one or multiple wounds 1 10 '[]'  - 0 Medium Wound Dressing one or multiple wounds '[]'  - 0 Large Wound Dressing one or multiple wounds '[]'  - 0 Application of Medications - topical '[]'  - 0 Application of Medications - injection INTERVENTIONS - Miscellaneous '[]'  - 0 External ear exam '[]'  - 0 Specimen Collection (cultures, biopsies, blood, body fluids, etc.) '[]'  - 0 Specimen(s) / Culture(s) sent or taken to Lab for analysis '[]'  - 0 Patient Transfer (multiple staff / Civil Service fast streamer / Similar devices) '[]'  - 0 Simple Staple / Suture removal (25 or less) '[]'  - 0 Complex Staple / Suture removal (26 or more) '[]'  - 0 Hypo / Hyperglycemic Management (close monitor of Blood Glucose) '[]'  - 0 Ankle / Brachial Index (ABI) - do not check if billed separately X- 1 5 Vital Signs Has the patient been seen at the hospital within the last three years: Yes Total Score: 110 Level Of Care: New/Established - Level 3 Electronic Signature(s) Signed: 06/26/2019 5:33:37 PM By: Deon Pilling Entered By: Deon Pilling on 06/26/2019 09:17:51 -------------------------------------------------------------------------------- Encounter Discharge Information Details Patient Name: Date of Service: Mike Lima L. 06/26/2019 8:45 A M Medical Record Number: [id number] Patient Account Number: [id number] Date of Birth/Sex: Treating RN: 01-Jun-1980 (39 y.o. Marvis Repress Primary Care Azizah Lisle: PA Haig Prophet, NO Other Clinician: Referring Adah Stoneberg: Treating Vyolet Sakuma/Extender: Silvano Rusk,  Designated Weeks in Treatment: 3 Encounter Discharge Information Items Discharge Condition: Stable Ambulatory Status: Ambulatory Discharge Destination: Home Transportation: Private Auto Accompanied By: self Schedule Follow-up Appointment: Yes Clinical Summary of Care: Patient Declined Electronic Signature(s) Signed: 06/26/2019 5:19:50 PM By: Kela Millin Entered By: Kela Millin on 06/26/2019 09:21:41 -------------------------------------------------------------------------------- Lower Extremity Assessment Details Patient Name: Date of Service: Mike Bowen. 06/26/2019 8:45 A M Medical Record Number: [id number] Patient Account Number: [id number] Date of Birth/Sex: Treating RN: 08-Oct-1980 (39 y.o. Janyth Contes Primary Care Marielle Mantione: PA Haig Prophet, NO Other Clinician: Referring Jonathon Castelo: Treating Dedria Endres/Extender: Linton Ham None, Designated Weeks in Treatment: 3 Edema Assessment Assessed: [Left: No] [Right: No] Edema: [Left: N] [Right: o] Calf Left: Right: Point of Measurement: cm From Medial Instep cm 42.5 cm Ankle Left: Right: Point of Measurement: cm From Medial Instep cm 28.5 cm Vascular Assessment Pulses: Dorsalis Pedis Palpable: [Right:Yes] Electronic Signature(s) Signed: 06/26/2019 5:31:56 PM By: Levan Hurst RN, BSN Entered By: Levan Hurst on 06/26/2019 09:13:18 -------------------------------------------------------------------------------- Multi Wound Chart Details Patient Name: Date of Service: Mike Bowen. 06/26/2019 8:45 A M Medical Record Number: [id number] Patient Account Number: [id number] Date of Birth/Sex: Treating RN: 1980-03-01 (39 y.o. [name] Primary Care Neta Upadhyay: PA Haig Prophet, NO Other Clinician: Referring Eller Sweis: Treating Taahir Grisby/Extender: Silvano Rusk, Designated Weeks in Treatment: 3 Vital Signs Height(in): 72 Capillary Blood Glucose(mg/dl): 107 Weight(lbs): 256 Pulse(bpm): 64 Body Mass  Index(BMI): 35 Blood Pressure(mmHg): 173/105 Temperature(F): 98.1 Respiratory Rate(breaths/min): 18 Photos: [3:No Photos Right Foot] [N/A:N/A N/A] Wound Location: [3:Blister] [N/A:N/A] Wounding Event: [3:Diabetic Wound/Ulcer of the Lower] [N/A:N/A] Primary Etiology: [3:Extremity Hypertension, Type II Diabetes,] [N/A:N/A] Comorbid History: [3:Neuropathy 04/02/2019] [N/A:N/A] Date Acquired: [3:3] [N/A:N/A] Weeks of Treatment: [3:Open] [N/A:N/A] Wound Status: [3:0.7x0.3x0.1] [N/A:N/A] Measurements L x W x D (cm) [3:0.165] [N/A:N/A]  A (cm) : rea [3:0.016] [N/A:N/A] Volume (cm) : [3:83.60%] [N/A:N/A] % Reduction in A rea: [3:84.20%] [N/A:N/A] % Reduction in Volume: [3:Grade 2] [N/A:N/A] Classification: [3:Small] [N/A:N/A] Exudate A mount: [3:Serosanguineous] [N/A:N/A] Exudate Type: [3:red, brown] [N/A:N/A] Exudate Color: [3:Flat and Intact] [N/A:N/A] Wound Margin: [3:Large (67-100%)] [N/A:N/A] Granulation A mount: [3:Pink] [N/A:N/A] Granulation Quality: [3:None Present (0%)] [N/A:N/A] Necrotic A mount: [3:Fat Layer (Subcutaneous Tissue)] [N/A:N/A] Exposed Structures: [3:Exposed: Yes Fascia: No Tendon: No Muscle: No Joint: No Bone: No Medium (34-66%)] [N/A:N/A] Treatment Notes Wound #3 (Right, Lateral Foot) 1. Cleanse With Wound Cleanser 2. Periwound Care Skin Prep 3. Primary Dressing Applied Collegen AG 4. Secondary Dressing Foam Border Dressing Electronic Signature(s) Signed: 06/26/2019 5:41:26 PM By: Linton Ham MD Signed: 07/21/2019 1:30:32 PM By: Deon Pilling Entered By: Linton Ham on 06/26/2019 09:25:04 -------------------------------------------------------------------------------- Multi-Disciplinary Care Plan Details Patient Name: Date of Service: Mike Bowen. 06/26/2019 8:45 A M Medical Record Number: [id number] Patient Account Number: [id number] Date of Birth/Sex: Treating RN: 07-26-1980 (39 y.o. [name] Primary Care Mardy Lucier: PA Haig Prophet,  NO Other Clinician: Referring Ahmari Garton: Treating Milton Sagona/Extender: Silvano Rusk, Designated Weeks in Treatment: 3 Active Inactive Nutrition Nursing Diagnoses: Impaired glucose control: actual or potential Goals: Patient/caregiver verbalizes understanding of need to maintain therapeutic glucose control per primary care physician Date Initiated: 06/03/2019 Target Resolution Date: 07/04/2019 Goal Status: Active Interventions: Assess HgA1c results as ordered upon admission and as needed Assess patient nutrition upon admission and as needed per policy Notes: Wound/Skin Impairment Nursing Diagnoses: Knowledge deficit related to ulceration/compromised skin integrity Goals: Patient/caregiver will verbalize understanding of skin care regimen Date Initiated: 06/03/2019 Date Inactivated: 06/26/2019 Target Resolution Date: 07/04/2019 Goal Status: Met Ulcer/skin breakdown will have a volume reduction of 30% by week 4 Date Initiated: 06/03/2019 Target Resolution Date: 07/04/2019 Goal Status: Active Interventions: Assess patient/caregiver ability to obtain necessary supplies Assess patient/caregiver ability to perform ulcer/skin care regimen upon admission and as needed Assess ulceration(s) every visit Notes: Electronic Signature(s) Signed: 06/26/2019 5:33:37 PM By: Deon Pilling Signed: 07/21/2019 1:30:32 PM By: Deon Pilling Entered By: Deon Pilling on 06/26/2019 09:12:31 -------------------------------------------------------------------------------- Pain Assessment Details Patient Name: Date of Service: Mike Bowen. 06/26/2019 8:45 A M Medical Record Number: [id number] Patient Account Number: [id number] Date of Birth/Sex: Treating RN: 1981/01/29 (39 y.o. Janyth Contes Primary Care Magdeline Prange: PA Haig Prophet, NO Other Clinician: Referring Tabetha Haraway: Treating Tahirih Lair/Extender: Linton Ham None, Designated Weeks in Treatment: 3 Active Problems Location of Pain Severity and  Description of Pain Patient Has Paino No Site Locations Pain Management and Medication Current Pain Management: Electronic Signature(s) Signed: 06/26/2019 5:31:56 PM By: Levan Hurst RN, BSN Entered By: Levan Hurst on 06/26/2019 09:08:00 -------------------------------------------------------------------------------- Patient/Caregiver Education Details Patient Name: Date of Service: SLA DE, V ICTO R L. 4/29/2021andnbsp8:45 A M Medical Record Number: [id number] Patient Account Number: [id number] Date of Birth/Gender: Treating RN: 1980/06/18 (39 y.o. [name] Primary Care Physician: PA Haig Prophet, NO Other Clinician: Referring Physician: Treating Physician/Extender: Silvano Rusk, Designated Weeks in Treatment: 3 Education Assessment Education Provided To: Patient Education Topics Provided Wound/Skin Impairment: Handouts: Skin Care Do's and Dont's Methods: Explain/Verbal Responses: Reinforcements needed Electronic Signature(s) Signed: 06/26/2019 5:33:37 PM By: Deon Pilling Entered By: Deon Pilling on 06/26/2019 09:12:41 -------------------------------------------------------------------------------- Wound Assessment Details Patient Name: Date of Service: Mike Bowen 06/26/2019 8:45 A M Medical Record Number: [id number] Patient Account Number: [id number] Date of Birth/Sex: Treating RN: [birth year] (39 y.o. Janyth Contes Primary Care Mccauley Diehl: PA [place], NO Other  Clinician: Referring Woodfin Kiss: Treating Krysia Zahradnik/Extender: Linton Ham None, Designated Weeks in Treatment: 3 Wound Status Wound Number: 3 Primary Etiology: Diabetic Wound/Ulcer of the Lower Extremity Wound Location: Right Foot Wound Status: Open Wounding Event: Blister Comorbid History: Hypertension, Type II Diabetes, Neuropathy Date Acquired: 04/02/2019 Weeks Of Treatment: 3 Clustered Wound: No Photos Photo Uploaded By: Mikeal Hawthorne on 06/27/2019 14:49:39 Wound  Measurements Length: (cm) 0.7 Width: (cm) 0.3 Depth: (cm) 0.1 Area: (cm) 0.165 Volume: (cm) 0.016 % Reduction in Area: 83.6% % Reduction in Volume: 84.2% Epithelialization: Medium (34-66%) Tunneling: No Undermining: No Wound Description Classification: Grade 2 Wound Margin: Flat and Intact Exudate Amount: Small Exudate Type: Serosanguineous Exudate Color: red, brown Foul Odor After Cleansing: No Slough/Fibrino Yes Wound Bed Granulation Amount: Large (67-100%) Exposed Structure Granulation Quality: Pink Fascia Exposed: No Necrotic Amount: None Present (0%) Fat Layer (Subcutaneous Tissue) Exposed: Yes Tendon Exposed: No Muscle Exposed: No Joint Exposed: No Bone Exposed: No Electronic Signature(s) Signed: 06/26/2019 5:31:56 PM By: Levan Hurst RN, BSN Entered By: Levan Hurst on 06/26/2019 09:13:11 -------------------------------------------------------------------------------- [place] Details Patient Name: Date of Service: Mike Bowen. 06/26/2019 8:45 A M Medical Record Number: [id number] Patient Account Number: [id number] Date of Birth/Sex: Treating RN: 1980-05-05 (39 y.o. Janyth Contes Primary Care Hiliary Osorto: PA [place], NO Other Clinician: Referring Sybel Standish: Treating Avonlea Sima/Extender: Silvano Rusk, Designated Weeks in Treatment: 3 Vital Signs Time Taken: 09:07 Temperature (F): 98.1 Height (in): 72 Pulse (bpm): 79 Weight (lbs): 256 Respiratory Rate (breaths/min): 18 Body Mass Index (BMI): 34.7 Blood Pressure (mmHg): 173/105 Capillary Blood Glucose (mg/dl): 107 Reference Range: 80 - 120 mg / dl Notes glucose per pt report. BP elevated, pt states he has not take BP meds in 2 days due to meds locked in car. Electronic Signature(s) Signed: 06/26/2019 5:31:56 PM By: Levan Hurst RN, BSN Entered By: Levan Hurst on 06/26/2019 09:09:35

## 2019-07-24 NOTE — Progress Notes (Signed)
Boak, Amarius L. ([id number]) Visit Report for 07/21/2019 HPI Details Patient Name: Date of Service: Mike Bowen 07/21/2019 8:45 A M Medical Record Number: [id number] Patient Account Number: [id number] Date of Birth/Sex: Treating RN: [birth year] (39 y.o. Janyth Contes Primary Care Provider: PA Haig Prophet, NO Other Clinician: Referring Provider: Treating Provider/Extender: Tobi Bastos None, Designated Weeks in Treatment: 6 History of Present Illness HPI Description: 09/01/16; this is a 39 year old man with type 2 diabetes on oral agents. It is probable that his diabetes is not under good control although there has not been a recent hemoglobin A1c. He was in the ER on 06/25/16 with a glucose of 418. He does not have primary care. He is a nonsmoker. He has no known history of diabetic PAD or neuropathy. The patient states that 2 weeks ago he noted a blister on his right lateral fifth metatarsal head. This became erythematous and painful. He was seen in the ER on 08/24/16 and noted to have an ulcer. Culture of this grew methicillin sensitive staph aureus and he is on 10 days of doxycycline starting on 6/30. X-ray at the time was negative for bony abnormality. He does not have a prior history of diabetic wounds. He has not been systemically unwell. Lab work on 6/28 showed a white count of 10 with 80% neutrophils. Comprehensive metabolic panel was normal other than an albumin of 3.4. BUN and creatinine were normal CO2 was 28. 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 Once again significant tunneling inferiorly. Surrounding tissue is thick and almost the plastic-like consistency. Extensive debridement done. No obvious infection is seen in the area. 09/15/16; difficult wound on the lateral aspect of the right fifth metatarsal head. There is  some tunneling to this inferiorly. Aggressive debridement I did last week seems to of left a healthy granulated wound bed. There is no obvious infection. We have been using silver collagen. 09/22/16; Arrives with wound no better. Surrounding callous and non viable tissue. silver collagen 09/29/16 on evaluation today patient presents for evaluation of his right fifth metatarsal head ulcer. Fortunately there's no evidence of erythema surrounding infection and there is no purulent discharge at this point. Patient tells me that he is having no pain currently. He has no nausea, vomiting, diarrhea that would indicate a systemic infection. Overall things seem to be doing well although he had not been checking his blood sugars and therefore he cannot tell me how these have been running due to the fact that he had not yet gotten strips. Being out of work secondary to this ulcer has really caused him a lot of financial hardship. 10/06/2016 -- he has brought some papers from his workplace regarding disability and I have signed off on these today 10/20/16; he had Hydrofera Blue prescribed last week however he didn't have this one reason or another and he's been putting silver collagen on this. He arrives in clinic today extremely hypertensive. I had given him blood pressure medication myself about a month ago which he appears to be taking intermittently by his own admission. We also suggested primary physician's and gave him a list of options he doesn't seem to a followed through with any of this 10/27/16; using silver collagen. Once again the circumference of the wound appears macerated. Requires debridement. I'll change to silver alginate. 11/03/16; the patient is  using silver alginate as of last week. Small wound with healthy granulation. He has run out of his blood pressure medications although his blood pressure was 141/90 today. 11/17/16; patient is using silver alginate. Patient arrives today with smaller looking  wound although was some depth. He had an appointment with his primary care but had to cancel it last week 11/24/16; wound on the right lateral fifth metatarsal head. Small open area last week that I used Iodosorb ointment 4 with a hope of closing this down. He arrives today with a small open wound but with a considerable degree of undermining. Post debridement change to [facility] 12/08/16; the patient arrives today with a wound on the right lateral fifth metatarsal head in a healed state. When he was last here I debrided the undermining here to a healthy surface and he seems to of healed over in the last 2 weeks. There is a nice viable surface here. He is concerned about his job vis--vis active on his feet. I advised him to keep this area protected in issues. Initially this started as a blister that became secondarily infected or infection may have been the primary issue am not really certain. He is a type II diabetic READMISSION 12/28/16; I heal this patient out a little over 2 weeks ago. He states that the wound reopened on 12/24/16. He was in the ER yesterday. His lab work was reasonably unremarkable other than a glucose of over 300. A culture of the wound was done. X-ray showed suggestion of early osteomyelitis about the fifth digit proximal phalanx which might represent early osteomyelitis. I believe he was given IV antibiotics and discharged on Augmentin twice a day for 2 weeks. A culture was done that is pending. He tells me he is only on metformin for his diabetes. He has poorly controlled hypertension which we also noted when he was in the clinic last time he has an appointment with primary care on 01/10/17. ABI was 1.29 in this clinic last time He had a mildly elevated C-reactive protein his sedimentation rate was only in the 20s white count was normal 01/05/17; the wound itself does not look ominous today. Using silver alginate. Culture I did last week showed no growth although he did  have group B strep in the ER. He is completing the Augmentin they prescribed. He has not yet had a call for his MRI 01/12/17; the patient's MRI essentially showed the same thing as his x-ray that is a soft tissue ulcer over the lateral aspect of the fifth MTP with surrounding cellulitis and a small area of cortical irregularity along the lateral base of the fifth proximal phalanx with mild marrow edema and minimal enhancement suggesting a small focus of osteomyelitis versus reactive marrow changes secondary to adjacent inflammation. A small focus of osteomyelitis was favored. The patient is completing 2 weeks of Augmentin this will need to be extended. he is using silver alginate on the wound 01/26/18; patient is picked up his final 2 weeks of Augmentin. Wound is smaller and dimensions down. Still using silver alginate 02/02/17; 2 final weeks of Augmentin which is different from what I stated last week. He says he has one more refill. Would be reasonable to complete this. He started the Augmentin at the beginning of November when he was in the emergency room I continue it for a total of 6 weeks. His wound is basically closed over at this point although I am not completely sure of the viability of the surface  and I would like to continue to dress this for a further week at least The patient stubbed his toe while playing with his children he has a small open area on the tip of the fifth toeon the right. This is small and superficial 02/09/17 right 5th met head. This was a re-occurrence after being healed once. I treated him with oral Augmenting for a small area of underlying osteomyellits He is completing this.His wound is closed READMISSION 06/03/2019 This is a now 39 year old man with type 2 diabetes and a history of peripheral neuropathy. We had them here on 2 occasions in 2018 with a wound on the lateral part of the fifth metatarsal head. He was here for a period of time from July to October 2018 and  was healed out however he rapidly broke down. Was discovered to have osteomyelitis in this area I gave him empiric Augmentin this close note closed over and he has not had trouble in this area since. He says roughly 2 months ago at work he got his feet wet and he developed a blister and since then he has had a wound on the right lateral foot he has been using Neosporin on this. 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 Past medical history; type 2 diabetes with peripheral neuropathy, hypertension and a history of atrial fibrillation. He thinks his last hemoglobin A1c was in the sevens range. His ABI in our Clinic was noncompressible 4/15; wound is measuring smaller. We are using silver collagen K-Y jelly and bordered foam. He is using sandals when he is on his own time and padding this religiously and his work boots at work. 4/29; wound continues to contract and surface area we have been using some moistened silver collagen and border foam he has been changing this. He wears sandals on his own time. He has callus pads and padding for his work boots 07/21/19-Patient comes in after having silver collagen and bordered foam to the right lateral foot this area he is healed Electronic Signature(s) Signed: 07/21/2019 9:42:39 AM By: Tobi Bastos MD, MBA Entered By: Tobi Bastos on 07/21/2019 09:42:39 -------------------------------------------------------------------------------- Physical Exam Details Patient Name: Date of Service: Mike Bowen. 07/21/2019 8:45 A M Medical Record Number: [id number] Patient Account Number: [id number] Date of Birth/Sex: Treating RN: [birth year] (39 y.o. Janyth Contes Primary Care Provider: PA Haig Prophet, NO Other Clinician: Referring Provider: Treating Provider/Extender: Tobi Bastos None, Designated Weeks in Treatment: 6 Constitutional alert and oriented x 3.  sitting or standing blood pressure is within target range for patient.. supine blood pressure is within target range for patient.. pulse regular and within target range for patient.Marland Kitchen respirations regular, non-labored and within target range for patient.Marland Kitchen temperature within target range for patient.. . . Well- nourished and well-hydrated in no acute distress. Electronic Signature(s) Signed: 07/21/2019 9:42:56 AM By: Tobi Bastos MD, MBA Entered By: Tobi Bastos on 07/21/2019 09:42:55 -------------------------------------------------------------------------------- Physician Orders Details Patient Name: Date of Service: Mike Bowen. 07/21/2019 8:45 A M Medical Record Number: [id number] Patient Account Number: [id number] Date of Birth/Sex: Treating RN: 05-Apr-1980 (39 y.o. Janyth Contes Primary Care Provider: PA Haig Prophet, NO Other Clinician: Referring Provider: Treating Provider/Extender: Tobi Bastos None, Designated Weeks in Treatment: 6 Verbal / Phone Orders: No Diagnosis Coding ICD-10 Coding Code Description E11.621 Type 2 diabetes mellitus with foot ulcer L97.512 Non-pressure chronic ulcer of other part of right  foot with fat layer exposed E11.42 Type 2 diabetes mellitus with diabetic polyneuropathy Discharge From [facility] Discharge from [place] Signature(s) Signed: 07/21/2019 5:12:22 PM By: Levan Hurst RN, BSN Signed: 07/24/2019 4:57:23 PM By: Tobi Bastos MD, MBA Entered By: Levan Hurst on 07/21/2019 09:42:17 -------------------------------------------------------------------------------- Problem List Details Patient Name: Date of Service: Mike Bowen 07/21/2019 8:45 A M Medical Record Number: [id number] Patient Account Number: [id number] Date of Birth/Sex: Treating RN: 1981/02/09 (39 y.o. Janyth Contes Primary Care Provider: PA Haig Prophet, NO Other Clinician: Referring Provider: Treating Provider/Extender: Tobi Bastos None, Designated Weeks in Treatment: 6 Active Problems ICD-10 Encounter Code Description Active Date MDM Diagnosis E11.621 Type 2 diabetes mellitus with foot ulcer 06/03/2019 No Yes L97.512 Non-pressure chronic ulcer of other part of right foot with fat layer exposed 06/03/2019 No Yes E11.42 Type 2 diabetes mellitus with diabetic polyneuropathy 06/03/2019 No Yes Inactive Problems Resolved Problems Electronic Signature(s) Signed: 07/21/2019 5:12:22 PM By: Levan Hurst RN, BSN Signed: 07/24/2019 4:57:23 PM By: Tobi Bastos MD, MBA Entered By: Levan Hurst on 07/21/2019 09:41:57 -------------------------------------------------------------------------------- Progress Note Details Patient Name: Date of Service: Mike Bowen. 07/21/2019 8:45 A M Medical Record Number: [id number] Patient Account Number: [id number] Date of Birth/Sex: Treating RN: 11-25-1980 (39 y.o. Janyth Contes Primary Care Provider: PA Haig Prophet, NO Other Clinician: Referring Provider: Treating Provider/Extender: Tobi Bastos None, Designated Weeks in Treatment: 6 Subjective History of Present Illness (HPI) 09/01/16; this is a 39 year old man with type 2 diabetes on oral agents. It is probable that his diabetes is not under good control although there has not been a recent hemoglobin A1c. He was in the ER on 06/25/16 with a glucose of 418. He does not have primary care. He is a nonsmoker. He has no known history of diabetic PAD or neuropathy. The patient states that 2 weeks ago he noted a blister on his right lateral fifth metatarsal head. This became erythematous and painful. He was seen in the ER on 08/24/16 and noted to have an ulcer. Culture of this grew methicillin sensitive staph aureus and he is on 10 days of doxycycline starting on 6/30. X-ray at the time was negative for bony abnormality. He does not have a prior history of diabetic wounds. He has not been systemically unwell. Lab work on 6/28 showed  a white count of 10 with 80% neutrophils. Comprehensive metabolic panel was normal other than an albumin of 3.4. BUN and creatinine were normal CO2 was 28. 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 Once again significant tunneling inferiorly. Surrounding tissue is thick and almost the plastic-like consistency. Extensive debridement done. No obvious infection is seen in the area. 09/15/16; difficult wound on the lateral aspect of the right fifth metatarsal head. There is some tunneling to this inferiorly. Aggressive debridement I did last week seems to of left a healthy granulated wound bed. There is no obvious infection. We have been using silver collagen. 09/22/16; Arrives with wound no better. Surrounding callous and non viable tissue. silver collagen 09/29/16 on evaluation today patient presents for evaluation of his right fifth metatarsal head ulcer. Fortunately there's no evidence of erythema surrounding infection and there is no purulent discharge at this point. Patient tells me that he is having no pain currently. He has no nausea, vomiting, diarrhea that  would indicate a systemic infection. Overall things seem to be doing well although he had not been checking his blood sugars and therefore he cannot tell me how these have been running due to the fact that he had not yet gotten strips. Being out of work secondary to this ulcer has really caused him a lot of financial hardship. 10/06/2016 -- he has brought some papers from his workplace regarding disability and I have signed off on these today 10/20/16; he had Hydrofera Blue prescribed last week however he didn't have this one reason or another and he's been putting silver collagen on this. He arrives in clinic today extremely hypertensive. I had given him blood pressure medication myself  about a month ago which he appears to be taking intermittently by his own admission. We also suggested primary physician's and gave him a list of options he doesn't seem to a followed through with any of this 10/27/16; using silver collagen. Once again the circumference of the wound appears macerated. Requires debridement. I'll change to silver alginate. 11/03/16; the patient is using silver alginate as of last week. Small wound with healthy granulation. He has run out of his blood pressure medications although his blood pressure was 141/90 today. 11/17/16; patient is using silver alginate. Patient arrives today with smaller looking wound although was some depth. He had an appointment with his primary care but had to cancel it last week 11/24/16; wound on the right lateral fifth metatarsal head. Small open area last week that I used Iodosorb ointment 4 with a hope of closing this down. He arrives today with a small open wound but with a considerable degree of undermining. Post debridement change to [facility] 12/08/16; the patient arrives today with a wound on the right lateral fifth metatarsal head in a healed state. When he was last here I debrided the undermining here to a healthy surface and he seems to of healed over in the last 2 weeks. There is a nice viable surface here. He is concerned about his job vis--vis active on his feet. I advised him to keep this area protected in issues. Initially this started as a blister that became secondarily infected or infection may have been the primary issue am not really certain. He is a type II diabetic READMISSION 12/28/16; I heal this patient out a little over 2 weeks ago. He states that the wound reopened on 12/24/16. He was in the ER yesterday. His lab work was reasonably unremarkable other than a glucose of over 300. A culture of the wound was done. X-ray showed suggestion of early osteomyelitis about the fifth digit proximal phalanx which might  represent early osteomyelitis. I believe he was given IV antibiotics and discharged on Augmentin twice a day for 2 weeks. A culture was done that is pending. He tells me he is only on metformin for his diabetes. He has poorly controlled hypertension which we also noted when he was in the clinic last time he has an appointment with primary care on 01/10/17. ABI was 1.29 in this clinic last time He had a mildly elevated C-reactive protein his sedimentation rate was only in the 20s white count was normal 01/05/17; the wound itself does not look ominous today. Using silver alginate. Culture I did last week showed no growth although he did have group B strep in the ER. He is completing the Augmentin they prescribed. He has not yet had a call for his MRI 01/12/17; the patient's MRI essentially showed the same  thing as his x-ray that is a soft tissue ulcer over the lateral aspect of the fifth MTP with surrounding cellulitis and a small area of cortical irregularity along the lateral base of the fifth proximal phalanx with mild marrow edema and minimal enhancement suggesting a small focus of osteomyelitis versus reactive marrow changes secondary to adjacent inflammation. A small focus of osteomyelitis was favored. The patient is completing 2 weeks of Augmentin this will need to be extended. he is using silver alginate on the wound 01/26/18; patient is picked up his final 2 weeks of Augmentin. Wound is smaller and dimensions down. Still using silver alginate 02/02/17; 2 final weeks of Augmentin which is different from what I stated last week. He says he has one more refill. Would be reasonable to complete this. He started the Augmentin at the beginning of November when he was in the emergency room I continue it for a total of 6 weeks. His wound is basically closed over at this point although I am not completely sure of the viability of the surface and I would like to continue to dress this for a further week at  least The patient stubbed his toe while playing with his children he has a small open area on the tip of the fifth toeon the right. This is small and superficial 02/09/17 right 5th met head. This was a re-occurrence after being healed once. I treated him with oral Augmenting for a small area of underlying osteomyellits He is completing this.His wound is closed READMISSION 06/03/2019 This is a now 39 year old man with type 2 diabetes and a history of peripheral neuropathy. We had them here on 2 occasions in 2018 with a wound on the lateral part of the fifth metatarsal head. He was here for a period of time from July to October 2018 and was healed out however he rapidly broke down. Was discovered to have osteomyelitis in this area I gave him empiric Augmentin this close note closed over and he has not had trouble in this area since. He says roughly 2 months ago at work he got his feet wet and he developed a blister and since then he has had a wound on the right lateral foot he has been using Neosporin on this. 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 Past medical history; type 2 diabetes with peripheral neuropathy, hypertension and a history of atrial fibrillation. He thinks his last hemoglobin A1c was in the sevens range. His ABI in our Clinic was noncompressible 4/15; wound is measuring smaller. We are using silver collagen K-Y jelly and bordered foam. He is using sandals when he is on his own time and padding this religiously and his work boots at work. 4/29; wound continues to contract and surface area we have been using some moistened silver collagen and border foam he has been changing this. He wears sandals on his own time. He has callus pads and padding for his work boots 07/21/19-Patient comes in after having silver collagen and bordered foam to the right lateral foot this area he is  healed Objective Constitutional alert and oriented x 3. sitting or standing blood pressure is within target range for patient.. supine blood pressure is within target range for patient.. pulse regular and within target range for patient.Marland Kitchen respirations regular, non-labored and within target range for patient.Marland Kitchen temperature within target range for patient.. Well- nourished and well-hydrated in no acute  distress. Vitals Time Taken: 9:20 AM, Height: 72 in, Weight: 256 lbs, BMI: 34.7, Temperature: 98.1 F, Pulse: 78 bpm, Respiratory Rate: 18 breaths/min, Blood Pressure: 183/99 mmHg. General Notes: patient stated he did not check CBG this morning Integumentary (Hair, Skin) Wound #3 status is Open. Original cause of wound was Blister. The wound is located on the Right,Lateral Foot. The wound measures 0cm length x 0cm width x 0cm depth; 0cm^2 area and 0cm^3 volume. There is no tunneling or undermining noted. There is a none present amount of drainage noted. The wound margin is flat and intact. There is no granulation within the wound bed. There is no necrotic tissue within the wound bed. Assessment Active Problems ICD-10 Type 2 diabetes mellitus with foot ulcer Non-pressure chronic ulcer of other part of right foot with fat layer exposed Type 2 diabetes mellitus with diabetic polyneuropathy Plan Discharge From [facility]: Discharge from [place] on right lateral foot has healed, some fissures noted that are not open on the heel and had advised patient to use Aand D to keep it moist He will be discharged from the clinic today Electronic Signature(s) Signed: 07/21/2019 9:43:52 AM By: Tobi Bastos MD, MBA Entered By: Tobi Bastos on 07/21/2019 09:43:51 -------------------------------------------------------------------------------- SuperBill Details Patient Name: Date of Service: Mike Bowen 07/21/2019 Medical Record Number: [id number] Patient Account Number:  [id number] Date of Birth/Sex: Treating RN: 05-12-1980 (39 y.o. Janyth Contes Primary Care Provider: PA TIENT, NO Other Clinician: Referring Provider: Treating Provider/Extender: Tobi Bastos None, Designated Weeks in Treatment: 6 Diagnosis Coding ICD-10 Codes Code Description E11.621 Type 2 diabetes mellitus with foot ulcer L97.512 Non-pressure chronic ulcer of other part of right foot with fat layer exposed E11.42 Type 2 diabetes mellitus with diabetic polyneuropathy Facility Procedures CPT4 Code: [id number] Description: 62 - WOUND CARE VISIT-LEV 3 EST PT 1 Modifier: Quantity: Physician Procedures : CPT4 Code Description Modifier [id number] - WC PHYS LEVEL 2 - EST PT ICD-10 Diagnosis Description E11.621 Type 2 diabetes mellitus with foot ulcer Quantity: 1 Electronic Signature(s) Signed: 07/21/2019 5:12:22 PM By: Levan Hurst RN, BSN Signed: 07/24/2019 4:57:23 PM By: Tobi Bastos MD, MBA Previous Signature: 07/21/2019 9:44:11 AM Version By: Tobi Bastos MD, MBA Entered By: Levan Hurst on 07/21/2019 10:49:46

## 2019-10-10 ENCOUNTER — Other Ambulatory Visit: Payer: Self-pay

## 2019-10-10 ENCOUNTER — Encounter (HOSPITAL_COMMUNITY): Payer: Self-pay | Admitting: Emergency Medicine

## 2019-10-10 DIAGNOSIS — Z5321 Procedure and treatment not carried out due to patient leaving prior to being seen by health care provider: Secondary | ICD-10-CM | POA: Insufficient documentation

## 2019-10-10 DIAGNOSIS — R509 Fever, unspecified: Secondary | ICD-10-CM | POA: Diagnosis not present

## 2019-10-10 NOTE — ED Triage Notes (Signed)
Pt c/o fever and sob since Monday, he states his whole family has covid, but his test was negative Wed.

## 2019-10-11 ENCOUNTER — Emergency Department (HOSPITAL_COMMUNITY)
Admission: EM | Admit: 2019-10-11 | Discharge: 2019-10-11 | Disposition: A | Discharge status: Home / Self Care | Payer: 59 | Attending: Emergency Medicine | Admitting: Emergency Medicine

## 2019-10-15 ENCOUNTER — Emergency Department (HOSPITAL_COMMUNITY): Payer: 59

## 2019-10-15 ENCOUNTER — Other Ambulatory Visit: Payer: Self-pay

## 2019-10-15 ENCOUNTER — Emergency Department (HOSPITAL_COMMUNITY)
Admission: EM | Admit: 2019-10-15 | Discharge: 2019-10-15 | Disposition: A | Discharge status: Home / Self Care | Payer: 59 | Attending: Emergency Medicine | Admitting: Emergency Medicine

## 2019-10-15 ENCOUNTER — Encounter (HOSPITAL_COMMUNITY): Payer: Self-pay

## 2019-10-15 DIAGNOSIS — I10 Essential (primary) hypertension: Secondary | ICD-10-CM | POA: Diagnosis not present

## 2019-10-15 DIAGNOSIS — E119 Type 2 diabetes mellitus without complications: Secondary | ICD-10-CM | POA: Diagnosis not present

## 2019-10-15 DIAGNOSIS — U071 COVID-19: Secondary | ICD-10-CM | POA: Diagnosis not present

## 2019-10-15 DIAGNOSIS — Z79899 Other long term (current) drug therapy: Secondary | ICD-10-CM | POA: Diagnosis not present

## 2019-10-15 DIAGNOSIS — M7918 Myalgia, other site: Secondary | ICD-10-CM | POA: Diagnosis present

## 2019-10-15 DIAGNOSIS — Z7984 Long term (current) use of oral hypoglycemic drugs: Secondary | ICD-10-CM | POA: Diagnosis not present

## 2019-10-15 LAB — SARS CORONAVIRUS 2 BY RT PCR (HOSPITAL ORDER, PERFORMED IN ~~LOC~~ HOSPITAL LAB): SARS Coronavirus 2: POSITIVE — AB

## 2019-10-15 MED ORDER — BENZONATATE 100 MG PO CAPS: 100.0000 mg | ORAL_CAPSULE | Freq: Three times a day (TID) | ORAL | 0 refills | Status: DC

## 2019-10-15 NOTE — Discharge Instructions (Signed)
Please take Tylenol (acetaminophen) to relieve your pain.  You may take tylenol, up to 1,000 mg (two extra strength pills).  Do not take more than 3,000 mg tylenol in a 24 hour period.  Please check all medication labels as many medications such as pain and cold medications may contain tylenol. Please do not drink alcohol while taking this medication.  ° °

## 2019-10-15 NOTE — ED Notes (Signed)
Informed pt that he had a fever, offered tylenol as his dc instructions suggest, but explained that pt was in a room doing a procedure and it would be a few minutes before I could get some, asked if he wanted to wait or take tylenol when he got home, pt states that he will just take some when he gets home.

## 2019-10-15 NOTE — ED Notes (Signed)
Date and time results received: 10/15/19 *1345** (use smartphrase ".now" to insert current time)  Test: **covid* Critical Value: **Pos*  Name of Provider Notified: Deretha Emory

## 2019-10-15 NOTE — ED Triage Notes (Addendum)
Pt is having body aches. This has been ongoing for the last weeks. Denies any fevers. Has not had COVID vaccines. Has had a cough and SOB as well. Whole family has COVID, but his test on Wednesday was negative. He has not taken any medication for the body aches. Has been taking Nyquil and Dayquil

## 2019-10-15 NOTE — ED Provider Notes (Signed)
[facility] EMERGENCY DEPARTMENT Provider Note   CSN: [id number] Arrival date & time: 10/15/19  1137     History Chief Complaint  Patient presents with  . Generalized Body Aches    Mike Bowen is a 39 y.o. male with no pertinent past medical history who presents today for evaluation of multiple complaints.  He reports that he has had symptoms for about 12 days at this point.  He states that his entire family had tested positive for Covid however his test a week ago was negative.  He has been taking DayQuil and NyQuil without significant relief in his symptoms.  He is not vaccinated.  He reports cough, short of breath, myalgias/arthralgias and significant fatigue.  He is continuing to have occasional fevers.    HPI     Past Medical History:  Diagnosis Date  . Diabetes mellitus without complication (HCC)   . Hypertension     There are no problems to display for this patient.   Past Surgical History:  Procedure Laterality Date  . SHOULDER SURGERY         Family History  Problem Relation Age of Onset  . Diabetes Other     Social History   Tobacco Use  . Smoking status: Never Smoker  . Smokeless tobacco: Never Used  Substance Use Topics  . Alcohol use: Yes  . Drug use: Yes    Types: Marijuana    Home Medications Prior to Admission medications   Medication Sig Start Date End Date Taking? Authorizing Provider  amoxicillin-clavulanate (AUGMENTIN) 875-125 MG tablet Take 1 tablet by mouth every 12 (twelve) hours. 12/26/16   Devoria AlbeKnapp, Iva, MD  benzonatate (TESSALON) 100 MG capsule Take 1 capsule (100 mg total) by mouth every 8 (eight) hours. 10/15/19   Cristina GongHammond, Takiah Maiden W, PA-C  cyclobenzaprine (FLEXERIL) 10 MG tablet Take 1 tablet (10 mg total) by mouth 3 (three) times daily as needed. Patient not taking: Reported on 04/01/2016 12/24/15   Pauline Ausriplett, Tammy, PA-C  doxycycline (VIBRAMYCIN) 100 MG capsule Take 1 capsule (100 mg total) by mouth 2 (two) times daily. 08/24/16    Arthor CaptainHarris, Abigail, PA-C  HYDROcodone-acetaminophen (NORCO/VICODIN) 5-325 MG tablet Take 1 tablet by mouth every 4 (four) hours as needed. Patient not taking: Reported on 04/01/2016 01/07/16   Burgess AmorIdol, Julie, PA-C  ibuprofen (ADVIL) 800 MG tablet Take 1 tablet (800 mg total) by mouth 3 (three) times daily. 03/23/19   Mannie StabileAberman, Caroline C, PA-C  ibuprofen (ADVIL,MOTRIN) 800 MG tablet Take 1 tablet (800 mg total) by mouth 3 (three) times daily. Patient not taking: Reported on 08/24/2016 12/24/15   Pauline Ausriplett, Tammy, PA-C  ketoconazole (NIZORAL) 2 % cream Apply 1 application topically daily. Apply to the feet and in between the toes once a day. 08/24/16   Arthor CaptainHarris, Abigail, PA-C  loperamide (IMODIUM) 2 MG capsule Take 1 capsule (2 mg total) by mouth 4 (four) times daily as needed for diarrhea or loose stools. Patient not taking: Reported on 08/24/2016 08/17/16   Horton, Mayer Maskerourtney F, MD  metFORMIN (GLUCOPHAGE) 500 MG tablet Take 1 tablet (500 mg total) by mouth 2 (two) times daily with a meal. Patient taking differently: Take 1,000 mg by mouth 2 (two) times daily with a meal.  06/25/16 08/24/16  Shaune PollackIsaacs, Cameron, MD  methocarbamol (ROBAXIN) 500 MG tablet Take 1 tablet (500 mg total) by mouth 2 (two) times daily. 03/23/19   Mannie StabileAberman, Caroline C, PA-C  ondansetron (ZOFRAN ODT) 4 MG disintegrating tablet Take 1 tablet (4 mg total) by  mouth every 8 (eight) hours as needed for nausea or vomiting. Patient not taking: Reported on 08/24/2016 08/17/16   Horton, Mayer Masker, MD  promethazine (PHENERGAN) 25 MG tablet Take 1 tablet (25 mg total) by mouth every 6 (six) hours as needed. Patient not taking: Reported on 08/24/2016 04/01/16   Donnetta Hutching, MD    Allergies    Patient has no known allergies.  Review of Systems   Review of Systems  Constitutional: Positive for chills, fatigue and fever.  Eyes: Negative for visual disturbance.  Respiratory: Positive for cough and shortness of breath.   Cardiovascular: Positive for chest pain.  Negative for palpitations and leg swelling.  Gastrointestinal: Negative for abdominal pain, diarrhea, nausea and vomiting.  Genitourinary: Negative for dysuria.  Musculoskeletal: Positive for arthralgias, back pain and myalgias. Negative for joint swelling, neck pain and neck stiffness.  Skin: Negative for color change, rash and wound.  Neurological: Negative for dizziness, weakness, light-headedness and headaches.  Psychiatric/Behavioral: Negative for confusion.  All other systems reviewed and are negative.   Physical Exam Updated Vital Signs BP (!) 147/89 (BP Location: Right Arm)   Pulse 89   Temp (!) 101 F (38.3 C) (Oral)   Resp 18   Ht 6' (1.829 m)   Wt 117.9 kg   SpO2 94%   BMI 35.26 kg/m   Physical Exam Vitals and nursing note reviewed.  Constitutional:      General: He is not in acute distress.    Appearance: He is well-developed. He is not ill-appearing or diaphoretic.  HENT:     Head: Normocephalic and atraumatic.  Eyes:     General: No scleral icterus.       Right eye: No discharge.        Left eye: No discharge.     Conjunctiva/sclera: Conjunctivae normal.  Cardiovascular:     Rate and Rhythm: Normal rate and regular rhythm.  Pulmonary:     Effort: Pulmonary effort is normal. No respiratory distress.  Chest:     Comments: Mild tenderness to palpation over anterior sternocostal joints. Abdominal:     General: There is no distension.  Musculoskeletal:        General: No deformity.     Cervical back: Normal range of motion and neck supple. No rigidity.     Right lower leg: No edema.     Left lower leg: No edema.     Comments: Palpation of his thoracic back recreates and exacerbates his reported backslash posterior chest pain.  Skin:    General: Skin is warm and dry.  Neurological:     Mental Status: He is alert.     Motor: No abnormal muscle tone.  Psychiatric:        Mood and Affect: Mood normal.        Behavior: Behavior normal.     ED Results /  Procedures / Treatments   Labs (all labs ordered are listed, but only abnormal results are displayed) Labs Reviewed  SARS CORONAVIRUS 2 BY RT PCR (HOSPITAL ORDER, PERFORMED IN [facility]) - Abnormal; Notable for the following components:      Result Value   SARS Coronavirus 2 POSITIVE (*)    All other components within normal limits    EKG EKG Interpretation  Date/Time:  Wednesday October 15 2019 14:16:11 EDT Ventricular Rate:  86 PR Interval:  142 QRS Duration: 82 QT Interval:  376 QTC Calculation: 449 R Axis:   58 Text Interpretation: Normal sinus rhythm  T wave abnormality, consider lateral ischemia Abnormal ECG Confirmed by Vanetta Mulders [phone]) on 10/15/2019 3:23:35 PM   Radiology DG Chest Port 1 View  Result Date: 10/15/2019 CLINICAL DATA:  COVID, cough, shortness of breath. EXAM: PORTABLE CHEST 1 VIEW COMPARISON:  Prior chest radiographs 06/24/2016 and earlier FINDINGS: Shallow inspiration radiograph. Heart size at the upper limits of normal. There are subtle patchy bilateral airspace opacities. No evidence of pleural effusion or pneumothorax. No acute bony abnormality identified. IMPRESSION: Subtle patchy bilateral airspace opacities likely reflecting atypical/viral pneumonia given the provided history of COVID infection. Electronically Signed   By: Jackey Loge DO   On: 10/15/2019 14:56    Procedures Procedures (including critical care time)  Medications Ordered in ED Medications - No data to display  ED Course  I have reviewed the triage vital signs and the nursing notes.  Pertinent labs & imaging results that were available during my care of the patient were reviewed by me and considered in my medical decision making (see chart for details).    MDM Rules/Calculators/A&P                          Mike Bowen was evaluated in Emergency Department on 10/15/2019 for the symptoms described in the history of present illness. He was evaluated in the  context of the global COVID-19 pandemic, which necessitated consideration that the patient might be at risk for infection with the SARS-CoV-2 virus that causes COVID-19. Institutional protocols and algorithms that pertain to the evaluation of patients at risk for COVID-19 are in a state of rapid change based on information released by regulatory bodies including the CDC and federal and state organizations. These policies and algorithms were followed during the patient's care in the ED.  Patient is a 39 year old man who presents today for evaluation of COVID-19 symptoms.  His prior test was negative.  Here today as tested positive.  He has had symptoms for more than 10 days and is outside window for Mab infusion which he would have qualified for based on his comorbidities.  Here he is not hypoxic.  I ambulated him and he stayed in the mid to high 90s on room air without significant symptoms.  Based on his reports of chest pain EKG was obtained without evidence of ischemia or other cause for his symptoms.  Chest x-ray shows pneumonia consistent with Covid pneumonia without evidence of a secondary bacterial infection.  He is continuing to have fevers.  We discussed that he needs to remain quarantined until he has been fever free without the use of ibuprofen or Tylenol for 48 hours and his symptoms improve.  He states his understanding.  Given that he does not have significant leg edema, is not hypoxic tachycardic or tachypneic doubt PE at this time.  Suspect MSK pain.   Conservative care at home.  No indication for admission at this time.   Return precautions were discussed with patient who states their understanding.  At the time of discharge patient denied any unaddressed complaints or concerns.  Patient is agreeable for discharge home.  Note: Portions of this report may have been transcribed using voice recognition software. Every effort was made to ensure accuracy; however, inadvertent computerized  transcription errors may be present   Final Clinical Impression(s) / ED Diagnoses Final diagnoses:  COVID-19    Rx / DC Orders ED Discharge Orders         Ordered    benzonatate (  TESSALON) 100 MG capsule  Every 8 hours     Discontinue  Reprint     10/15/19 1519           Norman Clay 10/15/19 2132    Vanetta Mulders, MD 10/20/19 [phone]

## 2019-10-31 ENCOUNTER — Emergency Department (HOSPITAL_COMMUNITY)
Admission: EM | Admit: 2019-10-31 | Discharge: 2019-10-31 | Disposition: A | Discharge status: Home / Self Care | Payer: 59 | Attending: Emergency Medicine | Admitting: Emergency Medicine

## 2019-10-31 ENCOUNTER — Other Ambulatory Visit: Payer: Self-pay

## 2019-10-31 ENCOUNTER — Emergency Department (HOSPITAL_COMMUNITY): Payer: 59

## 2019-10-31 ENCOUNTER — Encounter (HOSPITAL_COMMUNITY): Payer: Self-pay

## 2019-10-31 DIAGNOSIS — K2901 Acute gastritis with bleeding: Secondary | ICD-10-CM | POA: Insufficient documentation

## 2019-10-31 DIAGNOSIS — Z79899 Other long term (current) drug therapy: Secondary | ICD-10-CM | POA: Diagnosis not present

## 2019-10-31 DIAGNOSIS — I10 Essential (primary) hypertension: Secondary | ICD-10-CM | POA: Insufficient documentation

## 2019-10-31 DIAGNOSIS — Z7984 Long term (current) use of oral hypoglycemic drugs: Secondary | ICD-10-CM | POA: Diagnosis not present

## 2019-10-31 DIAGNOSIS — E119 Type 2 diabetes mellitus without complications: Secondary | ICD-10-CM | POA: Diagnosis not present

## 2019-10-31 LAB — URINALYSIS, ROUTINE W REFLEX MICROSCOPIC
Bacteria, UA: NONE SEEN
Bilirubin Urine: NEGATIVE
Glucose, UA: >=500 mg/dL — AB
Ketones, ur: NEGATIVE mg/dL
Leukocytes,Ua: NEGATIVE
Nitrite: NEGATIVE
Protein, ur: >=300 mg/dL — AB
Specific Gravity, Urine: 1.013 (ref 1.005–1.030)
pH: 7 (ref 5.0–8.0)

## 2019-10-31 LAB — COMPREHENSIVE METABOLIC PANEL
ALT: 24 U/L (ref 0–44)
AST: 18 U/L (ref 15–41)
Albumin: 2.1 g/dL — ABNORMAL LOW (ref 3.5–5.0)
Alkaline Phosphatase: 65 U/L (ref 38–126)
Anion gap: 11 (ref 5–15)
BUN: 15 mg/dL (ref 6–20)
CO2: 28 mmol/L (ref 22–32)
Calcium: 8.5 mg/dL — ABNORMAL LOW (ref 8.9–10.3)
Chloride: 94 mmol/L — ABNORMAL LOW (ref 98–111)
Creatinine, Ser: 1.24 mg/dL (ref 0.61–1.24)
GFR calc Af Amer: >60 mL/min (ref >60)
GFR calc non Af Amer: >60 mL/min (ref >60)
Glucose, Bld: 504 mg/dL (ref 70–99)
Potassium: 4.3 mmol/L (ref 3.5–5.1)
Sodium: 133 mmol/L — ABNORMAL LOW (ref 135–145)
Total Bilirubin: 0.5 mg/dL (ref 0.3–1.2)
Total Protein: 6 g/dL — ABNORMAL LOW (ref 6.5–8.1)

## 2019-10-31 LAB — CBC
HCT: 40.6 % (ref 39.0–52.0)
Hemoglobin: 13 g/dL (ref 13.0–17.0)
MCH: 25.7 pg — ABNORMAL LOW (ref 26.0–34.0)
MCHC: 32 g/dL (ref 30.0–36.0)
MCV: 80.4 fL (ref 80.0–100.0)
Platelets: 224 10*3/uL (ref 150–400)
RBC: 5.05 MIL/uL (ref 4.22–5.81)
RDW: 12.3 % (ref 11.5–15.5)
WBC: 7 10*3/uL (ref 4.0–10.5)
nRBC: 0 % (ref 0.0–0.2)

## 2019-10-31 LAB — BLOOD GAS, VENOUS
Acid-Base Excess: 10 mmol/L — ABNORMAL HIGH (ref 0.0–2.0)
Bicarbonate: 29.7 mmol/L — ABNORMAL HIGH (ref 20.0–28.0)
FIO2: 21
O2 Saturation: 21.3 %
Patient temperature: 36.8
pCO2, Ven: 64.6 mmHg — ABNORMAL HIGH (ref 44.0–60.0)
pH, Ven: 7.36 (ref 7.250–7.430)
pO2, Ven: <31 mmHg — CL (ref 32.0–45.0)

## 2019-10-31 LAB — LIPASE, BLOOD: Lipase: 20 U/L (ref 11–51)

## 2019-10-31 LAB — BETA-HYDROXYBUTYRIC ACID: Beta-Hydroxybutyric Acid: 0.05 mmol/L (ref 0.05–0.27)

## 2019-10-31 LAB — CBG MONITORING, ED: Glucose-Capillary: 264 mg/dL — ABNORMAL HIGH (ref 70–99)

## 2019-10-31 MED ORDER — ONDANSETRON 4 MG PO TBDP: 4.0000 mg | ORAL_TABLET | Freq: Three times a day (TID) | ORAL | 0 refills | Status: DC | PRN

## 2019-10-31 MED ORDER — PANTOPRAZOLE SODIUM 20 MG PO TBEC: 20.0000 mg | DELAYED_RELEASE_TABLET | Freq: Every day | ORAL | 0 refills | Status: DC

## 2019-10-31 MED ORDER — SODIUM CHLORIDE 0.9 % IV BOLUS
1000.0000 mL | Freq: Once | INTRAVENOUS | Status: AC
  Administered 2019-10-31: 1000 mL via INTRAVENOUS

## 2019-10-31 MED ORDER — ALUM & MAG HYDROXIDE-SIMETH 200-200-20 MG/5ML PO SUSP
30.0000 mL | Freq: Once | ORAL | Status: AC
  Administered 2019-10-31: 30 mL via ORAL
  Filled 2019-10-31: qty 30

## 2019-10-31 MED ORDER — LIDOCAINE VISCOUS HCL 2 % MT SOLN
15.0000 mL | Freq: Once | OROMUCOSAL | Status: AC
  Administered 2019-10-31: 15 mL via ORAL
  Filled 2019-10-31: qty 15

## 2019-10-31 MED ORDER — INSULIN ASPART 100 UNIT/ML IV SOLN
10.0000 [IU] | Freq: Once | INTRAVENOUS | Status: AC
  Administered 2019-10-31: 10 [IU] via INTRAVENOUS

## 2019-10-31 MED ORDER — ONDANSETRON HCL 4 MG/2ML IJ SOLN
4.0000 mg | Freq: Once | INTRAMUSCULAR | Status: AC
  Administered 2019-10-31: 4 mg via INTRAVENOUS
  Filled 2019-10-31: qty 2

## 2019-10-31 NOTE — ED Notes (Signed)
Date and time results received: 10/31/19 1735  Test: pO2 ven Critical Value: <31.0  Name of Provider Notified: Zackowski  Orders Received? Or Actions Taken?: n/a

## 2019-10-31 NOTE — ED Triage Notes (Signed)
Pt to er, pt c/o abd pain for the past 3-4 days, states that after he eats his pain goes away.  Pt states when he is hungry he has some nausea.

## 2019-10-31 NOTE — ED Provider Notes (Signed)
[facility] EMERGENCY DEPARTMENT Provider Note   CSN: [id number] Arrival date & time: 10/31/19  1123     History Chief Complaint  Patient presents with  . Abdominal Pain    Mike Bowen is a 39 y.o. male with PMHx HTN and Diabetes who presents to the ED today with complaint of nausea and blood tinged vomitus for the past 3-4 days. Pt states that after he finished his quarantine for COVID (tested positive 08/18 during ED visit) he began having these symptoms. Pt reports that anytime he gets hungry he will feel nauseated. He will eat something however a couple of hours later he will vomit. Pt reports last bowel movement 3 days ago; he denies any diarrhea however states he typically goes every day. No previous abdominal surgeries. Pt denies any suspicious food intake. No recent abx use. No foreign travel. No one else in the house is having GI symptoms. Pt denies taking excessive NSAIDs during his COVID 19 quarantine. No cigarette smoking. No heavy alcohol use. Pt denies fevers, chills, abdominal pain, urinary sx, testicular pain, or any other associated symptoms.   The history is provided by the patient and medical records.       Past Medical History:  Diagnosis Date  . Diabetes mellitus without complication (HCC)   . Hypertension     There are no problems to display for this patient.   Past Surgical History:  Procedure Laterality Date  . SHOULDER SURGERY         Family History  Problem Relation Age of Onset  . Diabetes Other     Social History   Tobacco Use  . Smoking status: Never Smoker  . Smokeless tobacco: Never Used  Substance Use Topics  . Alcohol use: Yes  . Drug use: Yes    Types: Marijuana    Home Medications Prior to Admission medications   Medication Sig Start Date End Date Taking? Authorizing Provider  acetaminophen (TYLENOL) 325 MG tablet Take 650 mg by mouth every 6 (six) hours as needed.   Yes [provider]  ketoconazole (NIZORAL) 2 %  cream Apply 1 application topically daily. Apply to the feet and in between the toes once a day. 08/24/16   Arthor Captain, PA-C  loperamide (IMODIUM) 2 MG capsule Take 1 capsule (2 mg total) by mouth 4 (four) times daily as needed for diarrhea or loose stools. Patient not taking: Reported on 08/24/2016 08/17/16   Horton, Mayer Masker, MD  metFORMIN (GLUCOPHAGE) 500 MG tablet Take 1 tablet (500 mg total) by mouth 2 (two) times daily with a meal. Patient taking differently: Take 1,000 mg by mouth 2 (two) times daily with a meal.  06/25/16 08/24/16  Shaune Pollack, MD  methocarbamol (ROBAXIN) 500 MG tablet Take 1 tablet (500 mg total) by mouth 2 (two) times daily. 03/23/19   Mannie Stabile, PA-C  ondansetron (ZOFRAN ODT) 4 MG disintegrating tablet Take 1 tablet (4 mg total) by mouth every 8 (eight) hours as needed for nausea or vomiting. 10/31/19   Ismelda Weatherman, PA-C  pantoprazole (PROTONIX) 20 MG tablet Take 1 tablet (20 mg total) by mouth daily. 10/31/19 11/30/19  Tanda Rockers, PA-C    Allergies    Patient has no known allergies.  Review of Systems   Review of Systems  Constitutional: Negative for chills and fever.  Gastrointestinal: Positive for abdominal pain, nausea and vomiting. Negative for diarrhea.  Genitourinary: Negative for decreased urine volume, difficulty urinating and testicular pain.  All other systems  reviewed and are negative.   Physical Exam Updated Vital Signs BP (!) 145/106 (BP Location: Right Arm)   Pulse 99   Temp 98.3 F (36.8 C) (Oral)   Resp 18   Ht 5' (1.524 m)   Wt 117.9 kg   SpO2 100%   BMI 50.78 kg/m   Physical Exam Vitals and nursing note reviewed.  Constitutional:      Appearance: He is not ill-appearing or diaphoretic.  HENT:     Head: Normocephalic and atraumatic.  Eyes:     Conjunctiva/sclera: Conjunctivae normal.  Cardiovascular:     Rate and Rhythm: Normal rate and regular rhythm.     Heart sounds: Normal heart sounds.  Pulmonary:      Effort: Pulmonary effort is normal.     Breath sounds: Normal breath sounds.  Abdominal:     General: Abdomen is flat.     Palpations: Abdomen is soft.     Tenderness: There is no abdominal tenderness. There is no right CVA tenderness, left CVA tenderness, guarding or rebound.  Musculoskeletal:     Cervical back: Neck supple.  Skin:    General: Skin is warm and dry.  Neurological:     Mental Status: He is alert.     ED Results / Procedures / Treatments   Labs (all labs ordered are listed, but only abnormal results are displayed) Labs Reviewed  COMPREHENSIVE METABOLIC PANEL - Abnormal; Notable for the following components:      Result Value   Sodium 133 (*)    Chloride 94 (*)    Glucose, Bld 504 (*)    Calcium 8.5 (*)    Total Protein 6.0 (*)    Albumin 2.1 (*)    All other components within normal limits  CBC - Abnormal; Notable for the following components:   MCH 25.7 (*)    All other components within normal limits  URINALYSIS, ROUTINE W REFLEX MICROSCOPIC - Abnormal; Notable for the following components:   Glucose, UA >=500 (*)    Hgb urine dipstick MODERATE (*)    Protein, ur >=300 (*)    All other components within normal limits  BLOOD GAS, VENOUS - Abnormal; Notable for the following components:   pCO2, Ven 64.6 (*)    pO2, Ven <31.0 (*)    Bicarbonate 29.7 (*)    Acid-Base Excess 10.0 (*)    All other components within normal limits  CBG MONITORING, ED - Abnormal; Notable for the following components:   Glucose-Capillary 264 (*)    All other components within normal limits  LIPASE, BLOOD  BETA-HYDROXYBUTYRIC ACID    EKG None  Radiology DG Chest 2 View  Result Date: 10/31/2019 CLINICAL DATA:  Low oxygen saturation on venous blood gas, COVID-19 diagnosis 10/15/2019 EXAM: CHEST - 2 VIEW COMPARISON:  Radiograph 10/15/2019 FINDINGS: Few persistent patchy and reticular opacities are present in the lungs, predominantly within the left periphery and bilateral lung  bases. No pneumothorax. No effusion. The cardiomediastinal contours are unremarkable. No acute osseous or soft tissue abnormality. Suspect a geode formation in the left humeral head which is unchanged from prior. Additional degenerative changes in the acromioclavicular joint as well as multilevel degenerative changes in the spine. IMPRESSION: Few persistent patchy and reticular opacities in the lungs, predominantly within the left periphery and bilateral lung bases, compatible with residual infection/inflammation or post inflammatory sequela. Electronically Signed   By: Kreg ShropshirePrice  DeHay M.D.   On: 10/31/2019 18:02    Procedures Procedures (including critical care time)  Medications Ordered in ED Medications  sodium chloride 0.9 % bolus 1,000 mL (0 mLs Intravenous Stopped 10/31/19 1923)  ondansetron (ZOFRAN) injection 4 mg (4 mg Intravenous Given 10/31/19 1457)  insulin aspart (novoLOG) injection 10 Units (10 Units Intravenous Given 10/31/19 1538)  alum & mag hydroxide-simeth (MAALOX/MYLANTA) 200-200-20 MG/5ML suspension 30 mL (30 mLs Oral Given 10/31/19 1918)    And  lidocaine (XYLOCAINE) 2 % viscous mouth solution 15 mL (15 mLs Oral Given 10/31/19 1918)    ED Course  I have reviewed the triage vital signs and the nursing notes.  Pertinent labs & imaging results that were available during my care of the patient were reviewed by me and considered in my medical decision making (see chart for details).  Clinical Course as of Oct 31 2002  Fri Oct 31, 2019  1526 Glucose(!!): 504 [MV]  1526 With sodium correction for hyperglycemia gap of 17.0  Anion gap: 11 [MV]  1731 Glucose-Capillary(!): 264 [MV]    Clinical Course User Index [MV] Tanda Rockers, PA-C   MDM Rules/Calculators/A&P                          39 year old male who presents to the ED today with complaint of nausea and blood-tinged vomitus for the past 3 to 4 days.  Reports that he will vomit a couple hours after eating or drinking anything.   He last had a bowel movement 3 days ago.  It was normal.  Denies any diarrhea however states that he normally goes every day.  He recently finished his quarantine after testing positive for COVID-19 on 08/18.  On arrival to the ED patient is afebrile, nontachycardic nontachypneic.  Appears to be in no acute distress.  He has no obvious abdominal tenderness palpation.  He does state he has a burning sensation in his throat.  He has not been taking anything at home for his symptoms.  He denies heavy alcohol use, cigarette use, heavy NSAID use.  No recent sick contacts, foreign travel, suspicious food intake.  Very much doubt acute abdomen at this time.  Suspect gastroenteritis versus GERD versus peptic ulcer disease.  Will provide fluids, Zofran, check lab work.  Once patient's nausea is under control we will plan to provide GI cocktail.  Patient does show me his vomitus in the toilet which is blood-tinged.  No coffee-ground emesis.   CBC without leukocytosis. Hgb stable.  CMP with glucose 504. Sodium 133. With correction sodium 139 and an anion gap of 17. Pt appears to only be on metformin; no signs of insulin use however will obtain VBG and betahydroxybutryic acid to assess for DKA. 10 units insulin provided.  Lipase 20  Beta hydroxybutyric acid within normal limits at 0.05 VBG has returned with a critical value of P02 < 31 and pCO2 64.6. Pt without complaints of SOB. His vitals are stable and satting 98% on RA however will obtain CXR at this time. Suspect this may be related to pt's previous COVID infection 2 weeks ago.   CXR with findings of post inflammatory/infectious process.   Pt provided with GI cocktail and has been fluid challenged with success. He has not had anymore episodes of emesis. I suspect the blood tinged vomitus is related to esophageal microtears. Hgb stable and BP without hypotension. BP elevated; pt is on BP meds however states he has been vomiting them up. Will plan to discharge at  this time with Rx zofran and protonix. Will have  pt continue taking his medications as prescribed. Suspect glucose elevated due to vomiting metformin. No signs for DKA at this time. Repeat CBG 264. Pt instructed on bland diet and to follow up with PCP. Strict return precautions discussed. Pt is in agreement with plan and stable for discharge home.   This note was prepared using Dragon voice recognition software and may include unintentional dictation errors due to the inherent limitations of voice recognition software.  Final Clinical Impression(s) / ED Diagnoses Final diagnoses:  Acute gastritis with hemorrhage, unspecified gastritis type    Rx / DC Orders ED Discharge Orders         Ordered    ondansetron (ZOFRAN ODT) 4 MG disintegrating tablet  Every 8 hours PRN        10/31/19 2001    pantoprazole (PROTONIX) 20 MG tablet  Daily        10/31/19 2001           Discharge Instructions     Please pick up medication and take as prescribed. The zofran (ondansetron) is for nausea (take as needed). Take the pantoprazole (protonix) every day prior to eating to help reduce the acid in your stomach. Monitor for worsening bleeding or vomit that looks like ground up coffee beans.   I would recommend a BLAND diet for the next few days. See attached.   Continue taking your medications as prescribed. Your blood pressure and glucose levels were elevated today secondary to you vomiting your medications.   Follow up with your PCP regarding your ED visit today.   Return to the ED IMMEDIATELY for any worsening symptoms including severe abdominal pain, constipation that lasts > 4-5 days, not passing gas, blood in your stool, black/tarry looking stools, or any other new/concerning symptoms/        Tanda Rockers, Cordelia Poche 10/31/19 2004    Bethann Berkshire, MD 11/04/19 [phone]

## 2019-10-31 NOTE — ED Notes (Signed)
Pt gone to xray

## 2019-10-31 NOTE — ED Notes (Signed)
Pt given ginger ale at this time per RN approval.

## 2019-10-31 NOTE — ED Notes (Signed)
Pt tolerating PO. When asked about BP, pt said that he has been throwing up his medication.

## 2019-10-31 NOTE — Discharge Instructions (Signed)
Please pick up medication and take as prescribed. The zofran (ondansetron) is for nausea (take as needed). Take the pantoprazole (protonix) every day prior to eating to help reduce the acid in your stomach. Monitor for worsening bleeding or vomit that looks like ground up coffee beans.   I would recommend a BLAND diet for the next few days. See attached.   Continue taking your medications as prescribed. Your blood pressure and glucose levels were elevated today secondary to you vomiting your medications.   Follow up with your PCP regarding your ED visit today.   Return to the ED IMMEDIATELY for any worsening symptoms including severe abdominal pain, constipation that lasts > 4-5 days, not passing gas, blood in your stool, black/tarry looking stools, or any other new/concerning symptoms/

## 2020-02-12 ENCOUNTER — Emergency Department (HOSPITAL_COMMUNITY)
Admission: EM | Admit: 2020-02-12 | Discharge: 2020-02-12 | Disposition: A | Discharge status: Home / Self Care | Payer: 59 | Source: Home / Self Care | Attending: Emergency Medicine | Admitting: Emergency Medicine

## 2020-02-12 ENCOUNTER — Other Ambulatory Visit: Payer: Self-pay

## 2020-02-12 ENCOUNTER — Encounter (HOSPITAL_COMMUNITY): Payer: Self-pay

## 2020-02-12 DIAGNOSIS — K0889 Other specified disorders of teeth and supporting structures: Secondary | ICD-10-CM | POA: Insufficient documentation

## 2020-02-12 DIAGNOSIS — I10 Essential (primary) hypertension: Secondary | ICD-10-CM | POA: Insufficient documentation

## 2020-02-12 DIAGNOSIS — E119 Type 2 diabetes mellitus without complications: Secondary | ICD-10-CM | POA: Insufficient documentation

## 2020-02-12 DIAGNOSIS — Z7984 Long term (current) use of oral hypoglycemic drugs: Secondary | ICD-10-CM | POA: Insufficient documentation

## 2020-02-12 MED ORDER — PENICILLIN V POTASSIUM 500 MG PO TABS: 500.0000 mg | ORAL_TABLET | Freq: Four times a day (QID) | ORAL | 0 refills | Status: DC

## 2020-02-12 NOTE — ED Provider Notes (Signed)
[facility] EMERGENCY DEPARTMENT Provider Note   CSN: [id number] Arrival date & time: 02/12/20  1904     History Chief Complaint  Patient presents with  . Dental Pain    Mike Bowen is a 39 y.o. male.  HPI   Patient with significant medical history of diabetes and hypertension patient presents to the emergency department with chief complaint of left-sided dental pain.  Patient states pain started yesterday and started to get worse.  He describes the pain as a sharp sensation on the left side of his face, states it radiates to the back of his jaw and up to his ear.  He has associated headaches but denies fevers, chills, change in vision, trismus, torticollis, tongue or throat swelling.  Patient states he took [place] powder yesterday and it seemed to help, he states he is not seen a dentist in a long time and denies  recent traumas to his teeth.  States the pain is intermittent, it went away this morning but came back today after work.  He denies alleviating factors.  Patient denies chest pain, shortness of breath, abdominal pain, nausea, vomiting, diarrhea, pedal edema.  Past Medical History:  Diagnosis Date  . Diabetes mellitus without complication (HCC)   . Hypertension     There are no problems to display for this patient.   Past Surgical History:  Procedure Laterality Date  . SHOULDER SURGERY         Family History  Problem Relation Age of Onset  . Diabetes Other     Social History   Tobacco Use  . Smoking status: Never Smoker  . Smokeless tobacco: Never Used  Substance Use Topics  . Alcohol use: Yes  . Drug use: Yes    Types: Marijuana    Home Medications Prior to Admission medications   Medication Sig Start Date End Date Taking? Authorizing Provider  acetaminophen (TYLENOL) 325 MG tablet Take 650 mg by mouth every 6 (six) hours as needed.    [provider]  ketoconazole (NIZORAL) 2 % cream Apply 1 application topically daily. Apply to the feet  and in between the toes once a day. 08/24/16   Arthor Captain, PA-C  loperamide (IMODIUM) 2 MG capsule Take 1 capsule (2 mg total) by mouth 4 (four) times daily as needed for diarrhea or loose stools. Patient not taking: Reported on 08/24/2016 08/17/16   Horton, Mayer Masker, MD  metFORMIN (GLUCOPHAGE) 500 MG tablet Take 1 tablet (500 mg total) by mouth 2 (two) times daily with a meal. Patient taking differently: Take 1,000 mg by mouth 2 (two) times daily with a meal.  06/25/16 08/24/16  Shaune Pollack, MD  methocarbamol (ROBAXIN) 500 MG tablet Take 1 tablet (500 mg total) by mouth 2 (two) times daily. 03/23/19   Mannie Stabile, PA-C  ondansetron (ZOFRAN ODT) 4 MG disintegrating tablet Take 1 tablet (4 mg total) by mouth every 8 (eight) hours as needed for nausea or vomiting. 10/31/19   Venter, Margaux, PA-C  pantoprazole (PROTONIX) 20 MG tablet Take 1 tablet (20 mg total) by mouth daily. 10/31/19 11/30/19  Hyman Hopes, Margaux, PA-C  penicillin v potassium (VEETID) 500 MG tablet Take 1 tablet (500 mg total) by mouth 4 (four) times daily for 7 days. 02/12/20 02/19/20  Carroll Sage, PA-C    Allergies    Patient has no known allergies.  Review of Systems   Review of Systems  Constitutional: Negative for chills and fever.  HENT: Positive for dental problem and  ear pain. Negative for congestion and sore throat.   Eyes: Negative for visual disturbance.  Respiratory: Negative for shortness of breath.   Cardiovascular: Negative for chest pain.  Gastrointestinal: Negative for abdominal pain.  Genitourinary: Negative for enuresis.  Musculoskeletal: Negative for back pain.  Skin: Negative for rash.  Neurological: Positive for headaches. Negative for dizziness.  Hematological: Does not bruise/bleed easily.    Physical Exam Updated Vital Signs BP (!) 216/112 (BP Location: Left Arm)   Pulse 84   Temp 98.5 F (36.9 C)   Resp 18   Ht 6' (1.829 m)   Wt 120.2 kg   SpO2 100%   BMI 35.94 kg/m    Physical Exam Vitals and nursing note reviewed.  Constitutional:      General: He is not in acute distress.    Appearance: He is not ill-appearing.  HENT:     Head: Normocephalic and atraumatic.     Right Ear: Tympanic membrane, ear canal and external ear normal.     Left Ear: Tympanic membrane, ear canal and external ear normal.     Nose: No congestion or rhinorrhea.     Comments: No erythematous turbinates on my exam, no nasal congestion present.    Mouth/Throat:     Mouth: Mucous membranes are moist.     Pharynx: Oropharynx is clear. No oropharyngeal exudate or posterior oropharyngeal erythema.     Comments: Oropharynx is visualized tongue and uvula are both midline, no exudates or erythema noted, patient is tolerating and own secretion at difficulty.  Patient's gums were palpated there is no fluctuance or induration felt on my exam.  No trismus, slight right-sided TMJ pain Eyes:     Extraocular Movements: Extraocular movements intact.     Conjunctiva/sclera: Conjunctivae normal.  Neck:     Comments: No torticollis present. Cardiovascular:     Rate and Rhythm: Normal rate.  Pulmonary:     Effort: Pulmonary effort is normal.  Musculoskeletal:     Cervical back: Normal range of motion. No tenderness.     Comments: Patient is moving all 4 extremities at difficulty.  Skin:    General: Skin is warm and dry.  Neurological:     Mental Status: He is alert.     Comments: Patient having no difficulty with word finding.  Psychiatric:        Mood and Affect: Mood normal.     ED Results / Procedures / Treatments   Labs (all labs ordered are listed, but only abnormal results are displayed) Labs Reviewed - No data to display  EKG None  Radiology No results found.  Procedures Procedures (including critical care time)  Medications Ordered in ED Medications - No data to display  ED Course  I have reviewed the triage vital signs and the nursing notes.  Pertinent labs &  imaging results that were available during my care of the patient were reviewed by me and considered in my medical decision making (see chart for details).    MDM Rules/Calculators/A&P                          Patient presents with left-sided dental pain.  He was alert, does not appear in acute distress, vital signs reassuring.  Due to well-appearing patient, benign physical exam further lab and imaging not warranted at this time.  I have low suspicion for peritonsillar abscess, retropharyngeal abscess, or Ludwig angina as oropharynx was visualized tongue and uvula were both  midline, there is no exudates, erythema or edema noted in the posterior pillars or on/ around tonsils.  Low suspicion for an abscess as gumline were palpated no fluctuance or induration felt.  Low suspicion for periorbital or orbital cellulitis as patient face had no erythematous, patient EOMs were intact, he had no pain with eye movement.  There is no obvious dental cavity noted on my exam, but possible this could be causing his pain versus TMJ pain.  Due to patient's immunocompromise will start him on antibiotics and have him follow-up with his dentist for further evaluation.  Vital signs have remained stable, no indication for hospital admission. Patient given at home care as well strict return precautions.  Patient verbalized that they understood agreed to said plan.   Final Clinical Impression(s) / ED Diagnoses Final diagnoses:  Pain, dental    Rx / DC Orders ED Discharge Orders         Ordered    penicillin v potassium (VEETID) 500 MG tablet  4 times daily        02/12/20 2223           Carroll Sage, PA-C 02/12/20 2349    Terald Sleeper, MD 02/13/20 [phone]

## 2020-02-12 NOTE — Discharge Instructions (Signed)
Seen here for dental pain.  Started you on antibiotics please take as prescribed.  I recommend over-the-counter pain medications like ibuprofen and or Tylenol every 6 hours as needed please follow dosaging on the back of bottle.  Recommend a follow-up with a dentist for further evaluation management.  Come back to the emergency department if you develop chest pain, shortness of breath, severe abdominal pain, uncontrolled nausea, vomiting, diarrhea.

## 2020-02-12 NOTE — ED Triage Notes (Signed)
Pt pov from home c/o left upper dental pain that makes his whole face hurt since yesterday. Only took a goody powder today.

## 2020-02-15 ENCOUNTER — Other Ambulatory Visit: Payer: Self-pay

## 2020-02-15 ENCOUNTER — Emergency Department (HOSPITAL_COMMUNITY): Payer: 59

## 2020-02-15 ENCOUNTER — Encounter (HOSPITAL_COMMUNITY): Payer: Self-pay | Admitting: Emergency Medicine

## 2020-02-15 ENCOUNTER — Inpatient Hospital Stay (HOSPITAL_COMMUNITY)
Admission: EM | Admit: 2020-02-15 | Discharge: 2020-02-18 | DRG: 305 | Disposition: A | Discharge status: Home / Self Care | Payer: 59 | Attending: Internal Medicine | Admitting: Internal Medicine

## 2020-02-15 DIAGNOSIS — R519 Headache, unspecified: Secondary | ICD-10-CM | POA: Diagnosis present

## 2020-02-15 DIAGNOSIS — Z7984 Long term (current) use of oral hypoglycemic drugs: Secondary | ICD-10-CM

## 2020-02-15 DIAGNOSIS — H5710 Ocular pain, unspecified eye: Secondary | ICD-10-CM | POA: Diagnosis present

## 2020-02-15 DIAGNOSIS — Z20822 Contact with and (suspected) exposure to covid-19: Secondary | ICD-10-CM | POA: Diagnosis present

## 2020-02-15 DIAGNOSIS — Z79899 Other long term (current) drug therapy: Secondary | ICD-10-CM

## 2020-02-15 DIAGNOSIS — F12929 Cannabis use, unspecified with intoxication, unspecified: Secondary | ICD-10-CM | POA: Diagnosis present

## 2020-02-15 DIAGNOSIS — R29898 Other symptoms and signs involving the musculoskeletal system: Secondary | ICD-10-CM

## 2020-02-15 DIAGNOSIS — E119 Type 2 diabetes mellitus without complications: Secondary | ICD-10-CM | POA: Diagnosis present

## 2020-02-15 DIAGNOSIS — Z794 Long term (current) use of insulin: Secondary | ICD-10-CM

## 2020-02-15 DIAGNOSIS — I10 Essential (primary) hypertension: Secondary | ICD-10-CM | POA: Diagnosis present

## 2020-02-15 DIAGNOSIS — Z7289 Other problems related to lifestyle: Secondary | ICD-10-CM

## 2020-02-15 DIAGNOSIS — Z833 Family history of diabetes mellitus: Secondary | ICD-10-CM | POA: Diagnosis not present

## 2020-02-15 DIAGNOSIS — K0889 Other specified disorders of teeth and supporting structures: Secondary | ICD-10-CM | POA: Diagnosis present

## 2020-02-15 DIAGNOSIS — I161 Hypertensive emergency: Secondary | ICD-10-CM | POA: Diagnosis present

## 2020-02-15 DIAGNOSIS — R200 Anesthesia of skin: Secondary | ICD-10-CM | POA: Diagnosis present

## 2020-02-15 DIAGNOSIS — I160 Hypertensive urgency: Secondary | ICD-10-CM | POA: Diagnosis not present

## 2020-02-15 DIAGNOSIS — Z9114 Patient's other noncompliance with medication regimen: Secondary | ICD-10-CM | POA: Diagnosis not present

## 2020-02-15 DIAGNOSIS — R600 Localized edema: Secondary | ICD-10-CM

## 2020-02-15 LAB — URINALYSIS, ROUTINE W REFLEX MICROSCOPIC
Bilirubin Urine: NEGATIVE
Glucose, UA: NEGATIVE mg/dL
Ketones, ur: NEGATIVE mg/dL
Leukocytes,Ua: NEGATIVE
Nitrite: NEGATIVE
Protein, ur: >=300 mg/dL — AB
Specific Gravity, Urine: 1.009 (ref 1.005–1.030)
pH: 6 (ref 5.0–8.0)

## 2020-02-15 LAB — BASIC METABOLIC PANEL
Anion gap: 11 (ref 5–15)
BUN: 13 mg/dL (ref 6–20)
CO2: 26 mmol/L (ref 22–32)
Calcium: 8.7 mg/dL — ABNORMAL LOW (ref 8.9–10.3)
Chloride: 103 mmol/L (ref 98–111)
Creatinine, Ser: 1.25 mg/dL — ABNORMAL HIGH (ref 0.61–1.24)
GFR, Estimated: >60 mL/min (ref >60)
Glucose, Bld: 102 mg/dL — ABNORMAL HIGH (ref 70–99)
Potassium: 3.7 mmol/L (ref 3.5–5.1)
Sodium: 140 mmol/L (ref 135–145)

## 2020-02-15 LAB — CBC
HCT: 41.9 % (ref 39.0–52.0)
Hemoglobin: 13.3 g/dL (ref 13.0–17.0)
MCH: 25.5 pg — ABNORMAL LOW (ref 26.0–34.0)
MCHC: 31.7 g/dL (ref 30.0–36.0)
MCV: 80.4 fL (ref 80.0–100.0)
Platelets: 175 10*3/uL (ref 150–400)
RBC: 5.21 MIL/uL (ref 4.22–5.81)
RDW: 12.6 % (ref 11.5–15.5)
WBC: 7.7 10*3/uL (ref 4.0–10.5)
nRBC: 0 % (ref 0.0–0.2)

## 2020-02-15 LAB — TROPONIN I (HIGH SENSITIVITY)
Troponin I (High Sensitivity): 32 ng/L — ABNORMAL HIGH (ref <18)
Troponin I (High Sensitivity): 29 ng/L — ABNORMAL HIGH (ref <18)

## 2020-02-15 LAB — RESP PANEL BY RT-PCR (FLU A&B, COVID) ARPGX2
Influenza A by PCR: NEGATIVE
Influenza B by PCR: NEGATIVE
SARS Coronavirus 2 by RT PCR: NEGATIVE

## 2020-02-15 LAB — BRAIN NATRIURETIC PEPTIDE: B Natriuretic Peptide: 73.9 pg/mL (ref 0.0–100.0)

## 2020-02-15 MED ORDER — NITROGLYCERIN IN D5W 200-5 MCG/ML-% IV SOLN
5.0000 ug/min | INTRAVENOUS | Status: DC
  Administered 2020-02-15: 5 ug/min via INTRAVENOUS
  Filled 2020-02-15: qty 250

## 2020-02-15 MED ORDER — LABETALOL HCL 5 MG/ML IV SOLN
10.0000 mg | Freq: Once | INTRAVENOUS | Status: AC
  Administered 2020-02-15: 21:00:00 10 mg via INTRAVENOUS
  Filled 2020-02-15: qty 4

## 2020-02-15 MED ORDER — AMLODIPINE BESYLATE 5 MG PO TABS
10.0000 mg | ORAL_TABLET | Freq: Once | ORAL | Status: AC
  Administered 2020-02-15: 23:00:00 10 mg via ORAL
  Filled 2020-02-15: qty 2

## 2020-02-15 MED ORDER — LABETALOL HCL 5 MG/ML IV SOLN
20.0000 mg | Freq: Once | INTRAVENOUS | Status: AC
  Administered 2020-02-15: 23:00:00 20 mg via INTRAVENOUS
  Filled 2020-02-15: qty 4

## 2020-02-15 NOTE — ED Triage Notes (Signed)
Pt reports BP elevated at home to 255/83.  Reports L arm heaviness and L fingers tingling that resolved.  No neuro deficits.  Ran out of BP medication 2 months ago.  No PCP.

## 2020-02-15 NOTE — ED Provider Notes (Signed)
[facility] EMERGENCY DEPARTMENT Provider Note   CSN: [id number] Arrival date & time: 02/15/20  1703     History Chief Complaint  Patient presents with  . Hypertension  . Arm Pain    Mike Bowen is a 39 y.o. male with a past medical history significant for diabetes and hypertension who presents to the ED due to elevated blood pressure readings at home.  Patient states he took his blood pressure earlier today which was 255/83. He notes he took it because when he was in the ED the other day they told him his pressures were elevated. After taking his BP, he states he felt left arm heaviness associated with numbness/tingling of left fingers around 5PM which has completely resolved.  Patient admits to intermittent frontal headaches for the past week, most significant on the left side.  Denies changes to vision, but notes eye pain.  Patient also admits to lower extremity edema over the past month.  Denies chest pain or shortness of breath.  Denies changes to urination.  Denies abdominal pain, nausea, vomiting, diarrhea.  No fever or chills.  Patient denies speech changes, dizziness, and unilateral weakness.  History obtained from patient and past medical records. No interpreter used during encounter.      Past Medical History:  Diagnosis Date  . Diabetes mellitus without complication (HCC)   . Hypertension     Patient Active Problem List   Diagnosis Date Noted  . Hypertensive emergency 02/15/2020    Past Surgical History:  Procedure Laterality Date  . SHOULDER SURGERY         Family History  Problem Relation Age of Onset  . Diabetes Other     Social History   Tobacco Use  . Smoking status: Never Smoker  . Smokeless tobacco: Never Used  Substance Use Topics  . Alcohol use: Yes  . Drug use: Yes    Types: Marijuana    Home Medications Prior to Admission medications   Medication Sig Start Date End Date Taking? Authorizing Provider  acetaminophen  (TYLENOL) 325 MG tablet Take 650 mg by mouth every 6 (six) hours as needed.    [provider]  ketoconazole (NIZORAL) 2 % cream Apply 1 application topically daily. Apply to the feet and in between the toes once a day. 08/24/16   Arthor Captain, PA-C  loperamide (IMODIUM) 2 MG capsule Take 1 capsule (2 mg total) by mouth 4 (four) times daily as needed for diarrhea or loose stools. Patient not taking: Reported on 08/24/2016 08/17/16   Horton, Mayer Masker, MD  metFORMIN (GLUCOPHAGE) 500 MG tablet Take 1 tablet (500 mg total) by mouth 2 (two) times daily with a meal. Patient taking differently: Take 1,000 mg by mouth 2 (two) times daily with a meal.  06/25/16 08/24/16  Shaune Pollack, MD  methocarbamol (ROBAXIN) 500 MG tablet Take 1 tablet (500 mg total) by mouth 2 (two) times daily. 03/23/19   Mannie Stabile, PA-C  ondansetron (ZOFRAN ODT) 4 MG disintegrating tablet Take 1 tablet (4 mg total) by mouth every 8 (eight) hours as needed for nausea or vomiting. 10/31/19   Venter, Margaux, PA-C  pantoprazole (PROTONIX) 20 MG tablet Take 1 tablet (20 mg total) by mouth daily. 10/31/19 11/30/19  Hyman Hopes, Margaux, PA-C  penicillin v potassium (VEETID) 500 MG tablet Take 1 tablet (500 mg total) by mouth 4 (four) times daily for 7 days. 02/12/20 02/19/20  Carroll Sage, PA-C    Allergies    Patient  has no known allergies.  Review of Systems   Review of Systems  Constitutional: Negative for chills and fever.  Respiratory: Negative for shortness of breath.   Cardiovascular: Negative for chest pain.  Gastrointestinal: Negative for abdominal pain, nausea and vomiting.  Neurological: Positive for numbness and headaches. Negative for dizziness, speech difficulty and weakness.  All other systems reviewed and are negative.   Physical Exam Updated Vital Signs BP (!) 201/127   Pulse 85   Temp 98.1 F (36.7 C) (Oral)   Resp 15   Ht 6' (1.829 m)   Wt 120.2 kg   SpO2 98%   BMI 35.94 kg/m    Physical Exam Vitals and nursing note reviewed.  Constitutional:      General: He is not in acute distress.    Appearance: He is not toxic-appearing.  HENT:     Head: Normocephalic.  Eyes:     Pupils: Pupils are equal, round, and reactive to light.  Cardiovascular:     Rate and Rhythm: Normal rate and regular rhythm.     Pulses: Normal pulses.     Heart sounds: Normal heart sounds. No murmur heard. No friction rub. No gallop.   Pulmonary:     Effort: Pulmonary effort is normal.     Breath sounds: Normal breath sounds.  Abdominal:     General: Abdomen is flat. There is no distension.     Palpations: Abdomen is soft.     Tenderness: There is no abdominal tenderness. There is no guarding or rebound.  Musculoskeletal:     Cervical back: Neck supple.     Comments: 2+ pitting edema bilaterally.  Skin:    General: Skin is warm and dry.  Neurological:     General: No focal deficit present.     Mental Status: He is alert.     Comments: Speech is clear, able to follow commands CN III-XII intact Normal strength in upper and lower extremities bilaterally including dorsiflexion and plantar flexion, strong and equal grip strength Sensation grossly intact throughout Moves extremities without ataxia, coordination intact No pronator drift   Psychiatric:        Mood and Affect: Mood normal.        Behavior: Behavior normal.     ED Results / Procedures / Treatments   Labs (all labs ordered are listed, but only abnormal results are displayed) Labs Reviewed  BASIC METABOLIC PANEL - Abnormal; Notable for the following components:      Result Value   Glucose, Bld 102 (*)    Creatinine, Ser 1.25 (*)    Calcium 8.7 (*)    All other components within normal limits  CBC - Abnormal; Notable for the following components:   MCH 25.5 (*)    All other components within normal limits  URINALYSIS, ROUTINE W REFLEX MICROSCOPIC - Abnormal; Notable for the following components:   Hgb urine  dipstick SMALL (*)    Protein, ur >=300 (*)    Bacteria, UA RARE (*)    All other components within normal limits  TROPONIN I (HIGH SENSITIVITY) - Abnormal; Notable for the following components:   Troponin I (High Sensitivity) 32 (*)    All other components within normal limits  TROPONIN I (HIGH SENSITIVITY) - Abnormal; Notable for the following components:   Troponin I (High Sensitivity) 29 (*)    All other components within normal limits  RESP PANEL BY RT-PCR (FLU A&B, COVID) ARPGX2  BRAIN NATRIURETIC PEPTIDE    EKG EKG Interpretation  Date/Time:  "Sunday February 15 2020 17:18:37 EST Ventricular Rate:  88 PR Interval:  148 QRS Duration: 84 QT Interval:  382 QTC Calculation: 462 R Axis:   84 Text Interpretation: Normal sinus rhythm T wave abnormality, consider lateral ischemia Prolonged QT Abnormal ECG LVH. lateral changes old. no sig change from previous. Confirmed by Pfeiffer, Marcy ([id number]) on 02/15/2020 7:49:18 PM   Radiology CT Head Wo Contrast  Result Date: 02/15/2020 CLINICAL DATA:  Headache EXAM: CT HEAD WITHOUT CONTRAST TECHNIQUE: Contiguous axial images were obtained from the base of the skull through the vertex without intravenous contrast. COMPARISON:  None. FINDINGS: Brain: No acute infarct or hemorrhage. Lateral ventricles and midline structures are unremarkable. No acute extra-axial fluid collections. No mass effect. Vascular: No hyperdense vessel or unexpected calcification. Skull: Normal. Negative for fracture or focal lesion. Sinuses/Orbits: No acute finding. Other: None. IMPRESSION: 1. No acute intracranial process. Electronically Signed   By: Michael  Brown M.D.   On: 02/15/2020 20:37   DG Chest Portable 1 View  Result Date: 02/15/2020 CLINICAL DATA:  Hypertension, left arm heaviness EXAM: PORTABLE CHEST 1 VIEW COMPARISON:  10/31/2019 FINDINGS: The heart size and mediastinal contours are within normal limits. Both lungs are clear. The visualized skeletal  structures are unremarkable. IMPRESSION: No active disease. Electronically Signed   By: Michael  Brown M.D.   On: 02/15/2020 20:49    Procedures Procedures (including critical care time) CRITICAL CARE Performed by: Jarman Litton C Joyelle Siedlecki   Total critical care time: 30 minutes  Critical care time was exclusive of separately billable procedures and treating other patients.  Critical care was necessary to treat or prevent imminent or life-threatening deterioration.  Critical care was time spent personally by me on the following activities: development of treatment plan with patient and/or surrogate as well as nursing, discussions with consultants, evaluation of patient's response to treatment, examination of patient, obtaining history from patient or surrogate, ordering and performing treatments and interventions, ordering and review of laboratory studies, ordering and review of radiographic studies, pulse oximetry and re-evaluation of patient's condition.  Medications Ordered in ED Medications  nitroGLYCERIN 50 mg in dextrose 5 % 250 mL (0.2 mg/mL) infusion (has no administration in time range)  labetalol (NORMODYNE) injection 10 mg (10 mg Intravenous Given 02/15/20 2116)  labetalol (NORMODYNE) injection 20 mg (20 mg Intravenous Given 02/15/20 2238)  amLODipine (NORVASC) tablet 10 mg (10 mg Oral Given 02/15/20 2238)    ED Course  I have reviewed the triage vital signs and the nursing notes.  Pertinent labs & imaging results that were available during my care of the patient were reviewed by me and considered in my medical decision making (see chart for details).  Clinical Course as of 02/15/20 2330  Sun Feb 15, 2020  1934 Troponin I (High Sensitivity)(!): 32 [CA]  1939 Creatinine(!): 1.25 [CA]  2123 Hgb urine dipstick(!): SMALL [CA]  2148 Protein(!): >=300 [CA]  2148 Bacteria, UA(!): RARE [CA]  2328 Discussed case with Dr. Lindzen with neurology who recommends MRI brain and TIA work-up  in outpatient setting [CA]    Clinical Course User Index [CA] Analysa Nutting C, PA-C   MDM Rules/Calculators/A&P                         39"  year old male presents to the ED due to elevated blood pressure readings at home.  Patient notes his BP at home was 255/83.  Patient has been experiencing intermittent frontal headaches.  Patient also admits  to left arm heaviness and left finger numbness/tingling that started around 5 PM today, but has completely resolved prior to my initial evaluation.  No chest pain or shortness of breath.  Arrival, patient is afebrile, not tachycardic or hypoxic.  Blood pressure extremely elevated at 199/123.  Patient in no acute distress and nontoxic-appearing.  Physical exam significant for 2+ pitting edema bilaterally.  Normal neurological exam.  Given patient's elevated BP's concern for possible hypertensive emergency/urgency.  Routine labs and troponin ordered at triage.  Will add BNP given patient's lower extremity edema.  We will also add CT head to rule out intracranial hemorrhage. Labetalol given here in the ED. Discussed case with Dr. Donnald GarrePfeiffer who evaluated patient at bedside and agrees with assessment and plan.  CBC with no leukocytosis and normal hemoglobin.  BMP significant for mild elevation creatinine of 1.25.  Initial troponin elevated at 32.  Will obtain delta troponin.  EKG personally reviewed which demonstrates normal sinus rhythm with no major changes from previous EKG. CT head personally reviewed which is negative for any acute abnormalities.  Chest x-ray personally reviewed which is negative for signs of pneumonia, pneumothorax, or widened mediastinum.  Patient's BP continues to be elevated. More labetalol and amlodipine given. Discussed case with Dr. Loney Lohathore with [facility] who agrees to admit patient for further treatment. Will discuss case with neurology and start IV nitroglyercin per request of Dr. Loney Lohathore. Discussed case with neurology. See note above. Final  Clinical Impression(s) / ED Diagnoses Final diagnoses:  Hypertensive urgency    Rx / DC Orders ED Discharge Orders    None       Jesusita Okaberman, Antionetta Ator C, PA-C 02/15/20 2331    Arby BarrettePfeiffer, Marcy, MD 02/19/20 1204

## 2020-02-15 NOTE — ED Provider Notes (Signed)
Medical screening examination/treatment/procedure(s) were conducted as a shared visit with non-physician practitioner(s) and myself.  I personally evaluated the patient during the encounter.  EKG Interpretation  Date/Time:  Sunday February 15 2020 17:18:37 EST Ventricular Rate:  88 PR Interval:  148 QRS Duration: 84 QT Interval:  382 QTC Calculation: 462 R Axis:   84 Text Interpretation: Normal sinus rhythm T wave abnormality, consider lateral ischemia Prolonged QT Abnormal ECG LVH. lateral changes old. no sig change from previous. Confirmed by Arby Barrette [phone]) on 02/15/2020 7:49:18 PM Patient reports that he ran out of his blood pressure medication several months ago he started developing a headache about a week ago.  He reports he has a lot of pain and pressure on the left side of his head and also behind his eye.  This has been waxing and waning in severity.  He reports he has not been able to monitor his blood pressures and only just recently got a machine at home to recognize how high his blood pressure is.  He has been able to stay compliant with his diabetic medications.  He has not had syncope, loss of vision or double vision.  He reports prior left arm heaviness and some tingling in his fingers that is now resolved.  Patient is alert with clear mental status.  No cranial nerve deficits.  Patient speech is clear.  He follows all commands appropriately.  No focal weakness.  Heart regular no murmur gallop.  Lungs clear.  EKG shows LVH consistent with prior EKG without acute ischemic change.  No acute findings on CT head.  Patient has been started on IV labetalol.  Pressures remain elevated but patient reports headache is improving at this time plan for admission for hypertensive urgency with headache and left arm symptoms now resolved.  Normal neurologic exam.   Arby Barrette, MD 02/15/20 2215

## 2020-02-16 ENCOUNTER — Inpatient Hospital Stay (HOSPITAL_COMMUNITY): Payer: 59

## 2020-02-16 DIAGNOSIS — I161 Hypertensive emergency: Secondary | ICD-10-CM

## 2020-02-16 DIAGNOSIS — R600 Localized edema: Secondary | ICD-10-CM

## 2020-02-16 DIAGNOSIS — R29898 Other symptoms and signs involving the musculoskeletal system: Secondary | ICD-10-CM

## 2020-02-16 LAB — GLUCOSE, CAPILLARY: Glucose-Capillary: 206 mg/dL — ABNORMAL HIGH (ref 70–99)

## 2020-02-16 LAB — CBG MONITORING, ED
Glucose-Capillary: 177 mg/dL — ABNORMAL HIGH (ref 70–99)
Glucose-Capillary: 193 mg/dL — ABNORMAL HIGH (ref 70–99)
Glucose-Capillary: 233 mg/dL — ABNORMAL HIGH (ref 70–99)
Glucose-Capillary: 234 mg/dL — ABNORMAL HIGH (ref 70–99)

## 2020-02-16 LAB — HIV ANTIBODY (ROUTINE TESTING W REFLEX): HIV Screen 4th Generation wRfx: NONREACTIVE

## 2020-02-16 LAB — HEMOGLOBIN A1C
Hgb A1c MFr Bld: 8.6 % — ABNORMAL HIGH (ref 4.8–5.6)
Mean Plasma Glucose: 200.12 mg/dL

## 2020-02-16 LAB — ECHOCARDIOGRAM COMPLETE
Area-P 1/2: 2.42 cm2
Height: 72 in
S' Lateral: 2.9 cm
Weight: 4240 oz

## 2020-02-16 MED ORDER — INSULIN ASPART 100 UNIT/ML ~~LOC~~ SOLN
0.0000 [IU] | Freq: Three times a day (TID) | SUBCUTANEOUS | Status: DC
  Administered 2020-02-16 (×2): 2 [IU] via SUBCUTANEOUS
  Administered 2020-02-17: 06:00:00 3 [IU] via SUBCUTANEOUS

## 2020-02-16 MED ORDER — ACETAMINOPHEN 325 MG PO TABS
650.0000 mg | ORAL_TABLET | Freq: Four times a day (QID) | ORAL | Status: DC | PRN
  Administered 2020-02-17: 650 mg via ORAL
  Filled 2020-02-16: qty 2

## 2020-02-16 MED ORDER — AMLODIPINE BESYLATE 10 MG PO TABS
10.0000 mg | ORAL_TABLET | Freq: Every day | ORAL | Status: DC
  Administered 2020-02-16 – 2020-02-18 (×3): 10 mg via ORAL
  Filled 2020-02-16 (×3): qty 1

## 2020-02-16 MED ORDER — INSULIN ASPART 100 UNIT/ML ~~LOC~~ SOLN
0.0000 [IU] | Freq: Every day | SUBCUTANEOUS | Status: DC
  Administered 2020-02-16: 21:00:00 2 [IU] via SUBCUTANEOUS

## 2020-02-16 MED ORDER — ENOXAPARIN SODIUM 40 MG/0.4ML ~~LOC~~ SOLN
40.0000 mg | SUBCUTANEOUS | Status: DC
  Administered 2020-02-16 – 2020-02-17 (×2): 40 mg via SUBCUTANEOUS
  Filled 2020-02-16 (×3): qty 0.4

## 2020-02-16 MED ORDER — FUROSEMIDE 10 MG/ML IJ SOLN
40.0000 mg | Freq: Every day | INTRAMUSCULAR | Status: DC
  Administered 2020-02-16 – 2020-02-17 (×2): 40 mg via INTRAVENOUS
  Filled 2020-02-16 (×3): qty 4

## 2020-02-16 MED ORDER — ACETAMINOPHEN 650 MG RE SUPP: 650.0000 mg | Freq: Four times a day (QID) | RECTAL | Status: DC | PRN

## 2020-02-16 MED ORDER — LABETALOL HCL 5 MG/ML IV SOLN
5.0000 mg | INTRAVENOUS | Status: DC | PRN
  Administered 2020-02-16 – 2020-02-17 (×3): 5 mg via INTRAVENOUS
  Filled 2020-02-16 (×3): qty 4

## 2020-02-16 MED ORDER — HYDRALAZINE HCL 20 MG/ML IJ SOLN
10.0000 mg | Freq: Four times a day (QID) | INTRAMUSCULAR | Status: DC | PRN
  Administered 2020-02-16: 21:00:00 10 mg via INTRAVENOUS
  Filled 2020-02-16 (×2): qty 1

## 2020-02-16 NOTE — ED Notes (Signed)
Hospitalist taking care of patient called and said give her a call back @ [phone]

## 2020-02-16 NOTE — ED Notes (Signed)
Verified with Dr. Lucianne Muss that parameters for nitro is not SBP 100-110. Per night nurse, MD wanted SBP in the 140-150s range. Dr. Lucianne Muss agreed.

## 2020-02-16 NOTE — H&P (Signed)
History and Physical    AQUARIUS LATOUCHE EYC:[id number] DOB: 21-Jul-1980 DOA: 02/15/2020  PCP: Patient, No Pcp Per Patient coming from: Home  Chief Complaint: High blood pressure, left arm heaviness  HPI: LENON KUENNEN is a 39 y.o. male with medical history significant of hypertension, non-insulin-dependent type II diabetes presenting to the ED with complaints of high blood pressure and left arm heaviness and tingling.  Patient states he does not have a primary care doctor and ran out of his blood pressure medications 2 months ago.  He does not remember which medications he was taking.  For the past few days he has had headaches and noticed that his left arm became heavy around 5:30 PM yesterday evening.  The fingers in his left hand felt "funny" at that time.  Denies any numbness or tingling.  Denies any weakness in any of his other extremities.  Denies any changes in vision.  Denies prior history of stroke.  Today when he checked his blood pressure he noticed it was very high in the 250s. He has no other complaints.  Denies fevers, chills, cough, shortness of breath, chest pain, or abdominal pain.  ED Course: Blood pressure significantly elevated with systolic in the 220s and diastolic in the 130s.  Remainder of vital signs stable.  WBC 7.7, hemoglobin 13.3, hematocrit 41.9, platelet 175K.  Sodium 140, potassium 3.7, chloride 103, bicarb 26, BUN 13, creatinine 1.2, glucose 102.  High sensitive troponin mildly elevated but stable (32 >29).  EKG without acute ischemic changes.  BNP within normal range.  Screening SARS-CoV-2 PCR test and influenza panel both negative.  Head CT negative for acute intracranial abnormality.  Chest x-ray showing no active disease.  Patient was given amlodipine 10 mg and IV labetalol 30 mg total but his blood pressure continues to be elevated with systolic in the 200s.  He was started on IV nitroglycerin infusion.  Review of Systems:  All systems reviewed and apart from  history of presenting illness, are negative.  Past Medical History:  Diagnosis Date  . Diabetes mellitus without complication (HCC)   . Hypertension     Past Surgical History:  Procedure Laterality Date  . SHOULDER SURGERY       reports that he has never smoked. He has never used smokeless tobacco. He reports current alcohol use. He reports current drug use. Drug: Marijuana.  No Known Allergies  Family History  Problem Relation Age of Onset  . Diabetes Other     Prior to Admission medications   Medication Sig Start Date End Date Taking? Authorizing Provider  acetaminophen (TYLENOL) 325 MG tablet Take 650 mg by mouth every 6 (six) hours as needed.    [provider]  ketoconazole (NIZORAL) 2 % cream Apply 1 application topically daily. Apply to the feet and in between the toes once a day. 08/24/16   Arthor Captain, PA-C  loperamide (IMODIUM) 2 MG capsule Take 1 capsule (2 mg total) by mouth 4 (four) times daily as needed for diarrhea or loose stools. Patient not taking: Reported on 08/24/2016 08/17/16   Horton, Mayer Masker, MD  metFORMIN (GLUCOPHAGE) 500 MG tablet Take 1 tablet (500 mg total) by mouth 2 (two) times daily with a meal. Patient taking differently: Take 1,000 mg by mouth 2 (two) times daily with a meal.  06/25/16 08/24/16  Shaune Pollack, MD  methocarbamol (ROBAXIN) 500 MG tablet Take 1 tablet (500 mg total) by mouth 2 (two) times daily. 03/23/19   Mannie Stabile,  PA-C  ondansetron (ZOFRAN ODT) 4 MG disintegrating tablet Take 1 tablet (4 mg total) by mouth every 8 (eight) hours as needed for nausea or vomiting. 10/31/19   Venter, Margaux, PA-C  pantoprazole (PROTONIX) 20 MG tablet Take 1 tablet (20 mg total) by mouth daily. 10/31/19 11/30/19  Hyman HopesVenter, Margaux, PA-C  penicillin v potassium (VEETID) 500 MG tablet Take 1 tablet (500 mg total) by mouth 4 (four) times daily for 7 days. 02/12/20 02/19/20  Carroll SageFaulkner, William J, PA-C    Physical Exam: Vitals:   02/16/20  0230 02/16/20 0245 02/16/20 0300 02/16/20 0315  BP: (!) 163/108 (!) 153/98 (!) 144/91 (!) 144/93  Pulse: 80 80 79 80  Resp: 14 18 17 15   Temp:      TempSrc:      SpO2: 97% 96% 95% 94%  Weight:      Height:        Physical Exam Constitutional:      General: He is not in acute distress. HENT:     Head: Normocephalic and atraumatic.  Eyes:     Extraocular Movements: Extraocular movements intact.     Conjunctiva/sclera: Conjunctivae normal.  Cardiovascular:     Rate and Rhythm: Normal rate and regular rhythm.     Pulses: Normal pulses.  Pulmonary:     Effort: Pulmonary effort is normal. No respiratory distress.     Breath sounds: Normal breath sounds. No wheezing or rales.  Abdominal:     General: Bowel sounds are normal. There is no distension.     Palpations: Abdomen is soft.     Tenderness: There is no abdominal tenderness.  Musculoskeletal:        General: No swelling or tenderness.     Cervical back: Normal range of motion and neck supple.     Right lower leg: Edema present.     Left lower leg: Edema present.     Comments: +3 pitting edema of bilateral lower extremities  Skin:    General: Skin is warm and dry.  Neurological:     General: No focal deficit present.     Mental Status: He is alert and oriented to person, place, and time.     Sensory: No sensory deficit.     Motor: No weakness.     Labs on Admission: I have personally reviewed following labs and imaging studies  CBC: Recent Labs  Lab 02/15/20 1736  WBC 7.7  HGB 13.3  HCT 41.9  MCV 80.4  PLT 175   Basic Metabolic Panel: Recent Labs  Lab 02/15/20 1736  NA 140  K 3.7  CL 103  CO2 26  GLUCOSE 102*  BUN 13  CREATININE 1.25*  CALCIUM 8.7*   GFR: Estimated Creatinine Clearance: 105.1 mL/min (A) (by C-G formula based on SCr of 1.25 mg/dL (H)). Liver Function Tests: No results for input(s): AST, ALT, ALKPHOS, BILITOT, PROT, ALBUMIN in the last 168 hours. No results for input(s): LIPASE,  AMYLASE in the last 168 hours. No results for input(s): AMMONIA in the last 168 hours. Coagulation Profile: No results for input(s): INR, PROTIME in the last 168 hours. Cardiac Enzymes: No results for input(s): CKTOTAL, CKMB, CKMBINDEX, TROPONINI in the last 168 hours. BNP (last 3 results) No results for input(s): PROBNP in the last 8760 hours. HbA1C: No results for input(s): HGBA1C in the last 72 hours. CBG: No results for input(s): GLUCAP in the last 168 hours. Lipid Profile: No results for input(s): CHOL, HDL, LDLCALC, TRIG, CHOLHDL, LDLDIRECT in the  last 72 hours. Thyroid Function Tests: No results for input(s): TSH, T4TOTAL, FREET4, T3FREE, THYROIDAB in the last 72 hours. Anemia Panel: No results for input(s): VITAMINB12, FOLATE, FERRITIN, TIBC, IRON, RETICCTPCT in the last 72 hours. Urine analysis:    Component Value Date/Time   COLORURINE YELLOW 02/15/2020 2114   APPEARANCEUR CLEAR 02/15/2020 2114   LABSPEC 1.009 02/15/2020 2114   PHURINE 6.0 02/15/2020 2114   GLUCOSEU NEGATIVE 02/15/2020 2114   HGBUR SMALL (A) 02/15/2020 2114   BILIRUBINUR NEGATIVE 02/15/2020 2114   KETONESUR NEGATIVE 02/15/2020 2114   PROTEINUR >=300 (A) 02/15/2020 2114   NITRITE NEGATIVE 02/15/2020 2114   LEUKOCYTESUR NEGATIVE 02/15/2020 2114    Radiological Exams on Admission: CT Head Wo Contrast  Result Date: 02/15/2020 CLINICAL DATA:  Headache EXAM: CT HEAD WITHOUT CONTRAST TECHNIQUE: Contiguous axial images were obtained from the base of the skull through the vertex without intravenous contrast. COMPARISON:  None. FINDINGS: Brain: No acute infarct or hemorrhage. Lateral ventricles and midline structures are unremarkable. No acute extra-axial fluid collections. No mass effect. Vascular: No hyperdense vessel or unexpected calcification. Skull: Normal. Negative for fracture or focal lesion. Sinuses/Orbits: No acute finding. Other: None. IMPRESSION: 1. No acute intracranial process. Electronically  Signed   By: Sharlet Salina M.D.   On: 02/15/2020 20:37   MR BRAIN WO CONTRAST  Result Date: 02/16/2020 CLINICAL DATA:  Initial evaluation for acute TIA. EXAM: MRI HEAD WITHOUT CONTRAST TECHNIQUE: Multiplanar, multiecho pulse sequences of the brain and surrounding structures were obtained without intravenous contrast. COMPARISON:  Prior head CT from 02/15/2020. FINDINGS: Brain: Cerebral volume within normal limits for patient age. No focal parenchymal signal abnormality identified. No abnormal foci of restricted diffusion to suggest acute or subacute ischemia. Gray-white matter differentiation well maintained. No encephalomalacia to suggest chronic infarction. No foci of susceptibility artifact to suggest acute or chronic intracranial hemorrhage. No mass lesion, midline shift or mass effect. No hydrocephalus. No extra-axial fluid collection. Pituitary gland and suprasellar region are normal. Midline structures intact and normal. Vascular: Major intracranial vascular flow voids well maintained and normal in appearance. Skull and upper cervical spine: Craniocervical junction normal. Visualized upper cervical spine within normal limits. Bone marrow signal intensity normal. No scalp soft tissue abnormality. Sinuses/Orbits: Globes and orbital soft tissues within normal limits. Left maxillary sinus retention cyst. Paranasal sinuses are otherwise clear. No mastoid effusion. Inner ear structures grossly normal. Other: None. IMPRESSION: Normal brain MRI. No acute intracranial abnormality identified. Electronically Signed   By: Rise Mu M.D.   On: 02/16/2020 02:31   DG Chest Portable 1 View  Result Date: 02/15/2020 CLINICAL DATA:  Hypertension, left arm heaviness EXAM: PORTABLE CHEST 1 VIEW COMPARISON:  10/31/2019 FINDINGS: The heart size and mediastinal contours are within normal limits. Both lungs are clear. The visualized skeletal structures are unremarkable. IMPRESSION: No active disease.  Electronically Signed   By: Sharlet Salina M.D.   On: 02/15/2020 20:49    EKG: Independently reviewed.  Sinus rhythm, T wave abnormality lateral leads similar to prior tracings.  Assessment/Plan Principal Problem:   Hypertensive emergency Active Problems:   Arm heaviness   Type 2 diabetes mellitus (HCC)   Bilateral lower extremity edema   Hypertensive emergency: Suspect due to antihypertensive medication nonadherence.  Patient does not have a PCP and ran out of his antihypertensive medications 2 months ago.  He was seen in the ED on 12/16 for dental pain and even at that time his blood pressure was elevated at 216/112.  Today in the  ED, blood pressure significantly elevated in the ED with systolic in the 220s and diastolic in the 130s.  He was given amlodipine 10 mg and IV labetalol 30 mg total but his blood pressure continued to be significantly elevated with systolic in the 200s.  He was started on IV nitroglycerin infusion.  Blood pressure currently improved.  High-sensitivity troponin mildly elevated but stable (32 >29).  EKG without acute changes.  Patient is not complaining of chest pain.  BNP normal.  No signs of aortic dissection on chest x-ray. -Continue nitroglycerin infusion with goal for target blood pressure <180/<120 over the first hour and <160/<110 over the next 23 hours.  Echocardiogram ordered.  He will need close PCP follow-up after discharge along with medication assistance.  Left arm heaviness and paresthesias: Symptoms have now resolved.  Head CT negative for acute intracranial abnormality.  Brain MRI negative. -Neurology recommending outpatient TIA work-up.  Bilateral lower extremity edema: BNP normal and no signs of pulmonary edema on chest imaging.  However, does have significant peripheral edema. -Echocardiogram ordered as mentioned above.  Renal function at baseline.  Start IV Lasix 40 mg daily.  Insulin dependent type 2 diabetes: No prior A1c results in the chart.   No insulin mentioned in home medications but patient reports taking insulin 70/30 mix which he buys from [place], dose not clear. -Check A1c.  Hold Metformin and order sliding scale insulin sensitive ACHS.  DVT prophylaxis: Lovenox Code Status: Full code Family Communication: No family available at this time. Disposition Plan: Status is: Inpatient  Remains inpatient appropriate because:Hemodynamically unstable   Dispo: The patient is from: Home              Anticipated d/c is to: Home              Anticipated d/c date is: 2 days              Patient currently is not medically stable to d/c.  The medical decision making on this patient was of high complexity and the patient is at high risk for clinical deterioration, therefore this is a level 3 visit.  John Giovanni MD [facility]  If 7PM-7AM, please contact night-coverage [email or web address]  02/16/2020, 3:58 AM

## 2020-02-16 NOTE — ED Notes (Addendum)
Dr. Lucianne Muss at bedside. Chatted regarding current BP and nitro setting. Plan is to recheck BP every 30 minutes and will re evaluate plan at 1300. No other changes at this time.

## 2020-02-16 NOTE — Care Plan (Signed)
This 39 years old male with PMH significant for hypertension, type 2 diabetes, noncompliant with medications and follow-up presents in the emergency department with headache, left arm heaviness and elevated blood pressure.  Patient does not have a primary care physician and he ran out of his blood pressure medication for 2 months.  He is found to have blood pressure 220/130 on arrival.  CT head negative for acute intracranial abnormality.  MRI unremarkable.  Left arm heaviness and paresthesia has resolved.  Neurology recommended outpatient TIA work-up.  Patient found to have bilateral pedal edema, Echocardiogram ordered,  started on IV Lasix 40 mg daily.  Patient started on nitro glycerin drip for blood pressure.  Blood pressure has been improving,  we will try to wean this down and discontinue nitro drip.  Patient was seen and examined in the ED,  reports feeling better,  blood pressure is improving.

## 2020-02-16 NOTE — ED Notes (Signed)
Followed up with Dr. Lucianne Muss regarding earlier conversation about nitro gtt and BP. As of now, no changes to orders. TITRATE NITRO drip to keep SBP 140-160 and DBP below 100.

## 2020-02-16 NOTE — Progress Notes (Addendum)
Received pt from ED with nitro drip at 10.5 ml/hr  BP (!) 170/107 (BP Location: Right Arm)   Pulse 94   Temp 98.2 F (36.8 C) (Oral)   Resp 18   Ht 6' (1.829 m)   Wt 120.2 kg   SpO2 97%   BMI 35.94 kg/m   Rate not documented in Mar on admission  Pt asymptomatic  Vitals stable  MD notified of current rate and blood pressure  Will continue to monitor

## 2020-02-16 NOTE — Progress Notes (Signed)
  Echocardiogram 2D Echocardiogram has been performed.  Mike Bowen 02/16/2020, 9:46 AM

## 2020-02-17 DIAGNOSIS — I161 Hypertensive emergency: Principal | ICD-10-CM

## 2020-02-17 LAB — CBC
HCT: 36 % — ABNORMAL LOW (ref 39.0–52.0)
Hemoglobin: 12.4 g/dL — ABNORMAL LOW (ref 13.0–17.0)
MCH: 27 pg (ref 26.0–34.0)
MCHC: 34.4 g/dL (ref 30.0–36.0)
MCV: 78.4 fL — ABNORMAL LOW (ref 80.0–100.0)
Platelets: 161 10*3/uL (ref 150–400)
RBC: 4.59 MIL/uL (ref 4.22–5.81)
RDW: 12.9 % (ref 11.5–15.5)
WBC: 8.9 10*3/uL (ref 4.0–10.5)
nRBC: 0 % (ref 0.0–0.2)

## 2020-02-17 LAB — PHOSPHORUS: Phosphorus: 3.9 mg/dL (ref 2.5–4.6)

## 2020-02-17 LAB — GLUCOSE, CAPILLARY
Glucose-Capillary: 232 mg/dL — ABNORMAL HIGH (ref 70–99)
Glucose-Capillary: 233 mg/dL — ABNORMAL HIGH (ref 70–99)
Glucose-Capillary: 148 mg/dL — ABNORMAL HIGH (ref 70–99)
Glucose-Capillary: 136 mg/dL — ABNORMAL HIGH (ref 70–99)

## 2020-02-17 LAB — COMPREHENSIVE METABOLIC PANEL
ALT: 20 U/L (ref 0–44)
AST: 26 U/L (ref 15–41)
Albumin: 1.9 g/dL — ABNORMAL LOW (ref 3.5–5.0)
Alkaline Phosphatase: 54 U/L (ref 38–126)
Anion gap: 8 (ref 5–15)
BUN: 14 mg/dL (ref 6–20)
CO2: 27 mmol/L (ref 22–32)
Calcium: 8.4 mg/dL — ABNORMAL LOW (ref 8.9–10.3)
Chloride: 103 mmol/L (ref 98–111)
Creatinine, Ser: 1.39 mg/dL — ABNORMAL HIGH (ref 0.61–1.24)
GFR, Estimated: >60 mL/min (ref >60)
Glucose, Bld: 236 mg/dL — ABNORMAL HIGH (ref 70–99)
Potassium: 3.6 mmol/L (ref 3.5–5.1)
Sodium: 138 mmol/L (ref 135–145)
Total Bilirubin: 0.9 mg/dL (ref 0.3–1.2)
Total Protein: 4.7 g/dL — ABNORMAL LOW (ref 6.5–8.1)

## 2020-02-17 LAB — MAGNESIUM: Magnesium: 1.6 mg/dL — ABNORMAL LOW (ref 1.7–2.4)

## 2020-02-17 MED ORDER — INSULIN GLARGINE 100 UNIT/ML ~~LOC~~ SOLN
10.0000 [IU] | Freq: Every day | SUBCUTANEOUS | Status: DC
  Administered 2020-02-17 – 2020-02-18 (×2): 10 [IU] via SUBCUTANEOUS
  Filled 2020-02-17 (×2): qty 0.1

## 2020-02-17 MED ORDER — INSULIN ASPART 100 UNIT/ML ~~LOC~~ SOLN: 0.0000 [IU] | Freq: Every day | SUBCUTANEOUS | Status: DC

## 2020-02-17 MED ORDER — LISINOPRIL 10 MG PO TABS
10.0000 mg | ORAL_TABLET | Freq: Every day | ORAL | Status: DC
  Administered 2020-02-17 – 2020-02-18 (×2): 10 mg via ORAL
  Filled 2020-02-17 (×2): qty 1

## 2020-02-17 MED ORDER — HYDRALAZINE HCL 25 MG PO TABS
25.0000 mg | ORAL_TABLET | Freq: Three times a day (TID) | ORAL | Status: DC
Start: 2020-02-17 — End: 2020-02-18
  Administered 2020-02-17 – 2020-02-18 (×4): 25 mg via ORAL
  Filled 2020-02-17 (×4): qty 1

## 2020-02-17 MED ORDER — INSULIN ASPART 100 UNIT/ML ~~LOC~~ SOLN
0.0000 [IU] | Freq: Three times a day (TID) | SUBCUTANEOUS | Status: DC
  Administered 2020-02-17 – 2020-02-18 (×3): 3 [IU] via SUBCUTANEOUS

## 2020-02-17 MED ORDER — INSULIN ASPART 100 UNIT/ML ~~LOC~~ SOLN
3.0000 [IU] | Freq: Three times a day (TID) | SUBCUTANEOUS | Status: DC
  Administered 2020-02-17 – 2020-02-18 (×3): 3 [IU] via SUBCUTANEOUS

## 2020-02-17 MED ORDER — ONDANSETRON HCL 4 MG/2ML IJ SOLN
4.0000 mg | Freq: Four times a day (QID) | INTRAMUSCULAR | Status: DC | PRN
  Administered 2020-02-17: 01:00:00 4 mg via INTRAVENOUS
  Filled 2020-02-17: qty 2

## 2020-02-17 MED ORDER — MAGNESIUM SULFATE 2 GM/50ML IV SOLN
2.0000 g | Freq: Once | INTRAVENOUS | Status: AC
  Administered 2020-02-17: 07:00:00 2 g via INTRAVENOUS
  Filled 2020-02-17: qty 50

## 2020-02-17 NOTE — Progress Notes (Signed)
Inpatient Diabetes Program Recommendations  AACE/ADA: New Consensus Statement on Inpatient Glycemic Control (2015)  Target Ranges:  Prepandial:   less than 140 mg/dL      Peak postprandial:   less than 180 mg/dL (1-2 hours)      Critically ill patients:  140 - 180 mg/dL   Lab Results  Component Value Date   GLUCAP 232 (H) 02/17/2020   HGBA1C 8.6 (H) 02/16/2020    Review of Glycemic Control Results for Mike Bowen, Mike Bowen (MRN [id number]) as of 02/17/2020 10:10  Ref. Range 02/16/2020 07:39 02/16/2020 12:21 02/16/2020 17:20 02/16/2020 21:15 02/17/2020 05:49  Glucose-Capillary Latest Ref Range: 70 - 99 mg/dL 283 (H) 662 (H) 947 (H) 206 (H) 232 (H)   Diabetes history: DM 2 Outpatient Diabetes medications:  Metformin 500 mg bid, Novolog 70/30 mix 15 units tid with meals Current orders for Inpatient glycemic control:  Novolog sensitive tid with meals and HS Inpatient Diabetes Program Recommendations:   Consider adding Lantus 10 units daily and Novolog 3 units tid with meals (hold if patient eats less than 50%).  Thanks,  Beryl Meager, RN, BC-ADM Inpatient Diabetes Coordinator Pager [phone] (8a-5p)

## 2020-02-17 NOTE — Progress Notes (Signed)
PROGRESS NOTE    Mike Bowen  VQM:[id number] DOB: 1980/09/01 DOA: 02/15/2020 PCP: Patient, No Pcp Per    Brief Narrative: This 39 years old male with PMH significant for hypertension, type 2 diabetes, noncompliant with medications and follow-up presents in the emergency department with headache, left arm heaviness and elevated blood pressure. Patient does not have a primary care physician and he ran out of his blood pressure medications for 2 months.  He is found to have blood pressure 220/130 on arrival in the ED.  CT head negative for acute intracranial abnormality. MRI unremarkable.  Left arm heaviness and paresthesia has resolved.  Neurology recommended outpatient TIA work-up. Patient found to have bilateral pedal edema, Echocardiogram: LVEF 60-65%.,  He is started on IV Lasix 40 mg daily. Patient started on nitro drip for blood pressure .  BP improving,  transitioned to oral blood pressure medications.   Assessment & Plan:   Principal Problem:   Hypertensive emergency Active Problems:   Arm heaviness   Type 2 diabetes mellitus (HCC)   Bilateral lower extremity edema   Hypertensive emergency: >>> Improving. Suspect due to antihypertensive medication nonadherence.   Patient ran out of his blood pressure medications for 2 months. He was started on IV nitroglycerin infusion.  Blood pressure currently improving.   High-sensitivity troponin mildly elevated but stable (32 >29).  EKG without acute changes.   Patient denies chest pain.  BNP normal.  No signs of aortic dissection on chest x-ray. Blood pressure improving, transition to oral blood pressure medications. Nitro drip discontinued.  Started patient on amlodipine 10 mg, hydralazine 25 mg 3 times daily. 12/21 : Lisinopril 10 mg daily added. Echocardiogram ; LVEF 60 to 65%.    There is no regional wall motion abnormalities. He will need close PCP follow-up after discharge along with medication assistance.  Left arm heaviness  and paresthesias: >>> Resolved. Symptoms have now resolved.  Head CT negative for acute intracranial abnormality.  Brain MRI:  negative. Neurology recommending outpatient TIA work-up.  Bilateral lower extremity edema:  BNP normal and no signs of pulmonary edema on chest imaging.  However, does have significant peripheral edema. Echocardiogram : LVEF 60-65%.  Renal function at baseline.  Continue  IV Lasix 40 mg daily.  Insulin dependent type 2 diabetes:  No prior A1c results in the chart.  Patient reports taking insulin 70/30 mix 15 units TID with meals. Hold Metformin  Continue Lantus 10 units daily and Novolog 3 units TID.   DVT prophylaxis: Lovenox Code Status: Full code Family Communication: No family at bedside  Disposition Plan:   Status is: Inpatient  Remains inpatient appropriate because:Inpatient level of care appropriate due to severity of illness   Dispo: The patient is from: Home              Anticipated d/c is to: Home              Anticipated d/c date is: 1 day              Patient currently is not medically stable to d/c.   Consultants:   None  Procedures:   Antimicrobials:   Anti-infectives (From admission, onward)   None      Subjective: Patient was seen and examined at bedside.  Overnight events noted.  Patient reports feeling better denies any headache, weakness and numbness..  Blood pressure still remains elevated.  Objective: Vitals:   02/17/20 0400 02/17/20 0727 02/17/20 0844 02/17/20 1112  BP: (!) 159/100  Marland Kitchen)  176/113   Pulse:      Resp: 16     Temp: 98.2 F (36.8 C) 98.1 F (36.7 C)  98.5 F (36.9 C)  TempSrc: Oral Oral  Oral  SpO2: 96%     Weight:      Height:        Intake/Output Summary (Last 24 hours) at 02/17/2020 1150 Last data filed at 02/17/2020 1110 Gross per 24 hour  Intake 938.45 ml  Output 3000 ml  Net -2061.55 ml   Filed Weights   02/15/20 1716 02/17/20 0056  Weight: 120.2 kg 115.4 kg     Examination:  General exam: Appears calm and comfortable , not in any acute distress. Respiratory system: Clear to auscultation. Respiratory effort normal. Cardiovascular system: S1 & S2 heard, RRR. No JVD, murmurs, rubs, gallops or clicks. No pedal edema. Gastrointestinal system: Abdomen is nondistended, soft and nontender. No organomegaly or masses felt. Normal bowel sounds heard. Central nervous system: Alert and oriented. No focal neurological deficits. Extremities: Symmetric 5 x 5 power.  No edema, no cyanosis, no clubbing. Skin: No rashes, lesions or ulcers Psychiatry: Judgement and insight appear normal. Mood & affect appropriate.     Data Reviewed: I have personally reviewed following labs and imaging studies  CBC: Recent Labs  Lab 02/15/20 1736 02/17/20 0313  WBC 7.7 8.9  HGB 13.3 12.4*  HCT 41.9 36.0*  MCV 80.4 78.4*  PLT 175 161   Basic Metabolic Panel: Recent Labs  Lab 02/15/20 1736 02/17/20 0313  NA 140 138  K 3.7 3.6  CL 103 103  CO2 26 27  GLUCOSE 102* 236*  BUN 13 14  CREATININE 1.25* 1.39*  CALCIUM 8.7* 8.4*  MG  --  1.6*  PHOS  --  3.9   GFR: Estimated Creatinine Clearance: 92.6 mL/min (A) (by C-G formula based on SCr of 1.39 mg/dL (H)). Liver Function Tests: Recent Labs  Lab 02/17/20 0313  AST 26  ALT 20  ALKPHOS 54  BILITOT 0.9  PROT 4.7*  ALBUMIN 1.9*   No results for input(s): LIPASE, AMYLASE in the last 168 hours. No results for input(s): AMMONIA in the last 168 hours. Coagulation Profile: No results for input(s): INR, PROTIME in the last 168 hours. Cardiac Enzymes: No results for input(s): CKTOTAL, CKMB, CKMBINDEX, TROPONINI in the last 168 hours. BNP (last 3 results) No results for input(s): PROBNP in the last 8760 hours. HbA1C: Recent Labs    02/16/20 0953  HGBA1C 8.6*   CBG: Recent Labs  Lab 02/16/20 1221 02/16/20 1720 02/16/20 2115 02/17/20 0549 02/17/20 1114  GLUCAP 233* 234* 206* 232* 233*   Lipid  Profile: No results for input(s): CHOL, HDL, LDLCALC, TRIG, CHOLHDL, LDLDIRECT in the last 72 hours. Thyroid Function Tests: No results for input(s): TSH, T4TOTAL, FREET4, T3FREE, THYROIDAB in the last 72 hours. Anemia Panel: No results for input(s): VITAMINB12, FOLATE, FERRITIN, TIBC, IRON, RETICCTPCT in the last 72 hours. Sepsis Labs: No results for input(s): PROCALCITON, LATICACIDVEN in the last 168 hours.  Recent Results (from the past 240 hour(s))  Resp Panel by RT-PCR (Flu A&B, Covid) Nasopharyngeal Swab     Status: None   Collection Time: 02/15/20 10:42 PM   Specimen: Nasopharyngeal Swab; Nasopharyngeal(NP) swabs in vial transport medium  Result Value Ref Range Status   SARS Coronavirus 2 by RT PCR NEGATIVE NEGATIVE Final    Comment: (NOTE) SARS-CoV-2 target nucleic acids are NOT DETECTED.  The SARS-CoV-2 RNA is generally detectable in upper respiratory specimens during the  acute phase of infection. The lowest concentration of SARS-CoV-2 viral copies this assay can detect is 138 copies/mL. A negative result does not preclude SARS-Cov-2 infection and should not be used as the sole basis for treatment or other patient management decisions. A negative result may occur with  improper specimen collection/handling, submission of specimen other than nasopharyngeal swab, presence of viral mutation(s) within the areas targeted by this assay, and inadequate number of viral copies(<138 copies/mL). A negative result must be combined with clinical observations, patient history, and epidemiological information. The expected result is Negative.  Fact Sheet for Patients:  BloggerCourse.com  Fact Sheet for Healthcare Providers:  SeriousBroker.it  This test is no t yet approved or cleared by the [place] FDA and  has been authorized for detection and/or diagnosis of SARS-CoV-2 by FDA under an Emergency Use Authorization (EUA). This EUA  will remain  in effect (meaning this test can be used) for the duration of the COVID-19 declaration under Section 564(b)(1) of the Act, 21 U.S.C.section 360bbb-3(b)(1), unless the authorization is terminated  or revoked sooner.       Influenza A by PCR NEGATIVE NEGATIVE Final   Influenza B by PCR NEGATIVE NEGATIVE Final    Comment: (NOTE) The Xpert Xpress SARS-CoV-2/FLU/RSV plus assay is intended as an aid in the diagnosis of influenza from Nasopharyngeal swab specimens and should not be used as a sole basis for treatment. Nasal washings and aspirates are unacceptable for Xpert Xpress SARS-CoV-2/FLU/RSV testing.  Fact Sheet for Patients: BloggerCourse.com  Fact Sheet for Healthcare Providers: SeriousBroker.it  This test is not yet approved or cleared by the [place] FDA and has been authorized for detection and/or diagnosis of SARS-CoV-2 by FDA under an Emergency Use Authorization (EUA). This EUA will remain in effect (meaning this test can be used) for the duration of the COVID-19 declaration under Section 564(b)(1) of the Act, 21 U.S.C. section 360bbb-3(b)(1), unless the authorization is terminated or revoked.  Performed at [facility], [address], [place], [place] [address]     Radiology Studies: CT Head Wo Contrast  Result Date: 02/15/2020 CLINICAL DATA:  Headache EXAM: CT HEAD WITHOUT CONTRAST TECHNIQUE: Contiguous axial images were obtained from the base of the skull through the vertex without intravenous contrast. COMPARISON:  None. FINDINGS: Brain: No acute infarct or hemorrhage. Lateral ventricles and midline structures are unremarkable. No acute extra-axial fluid collections. No mass effect. Vascular: No hyperdense vessel or unexpected calcification. Skull: Normal. Negative for fracture or focal lesion. Sinuses/Orbits: No acute finding. Other: None. IMPRESSION: 1. No acute intracranial process.  Electronically Signed   By: Sharlet Salina M.D.   On: 02/15/2020 20:37   MR BRAIN WO CONTRAST  Result Date: 02/16/2020 CLINICAL DATA:  Initial evaluation for acute TIA. EXAM: MRI HEAD WITHOUT CONTRAST TECHNIQUE: Multiplanar, multiecho pulse sequences of the brain and surrounding structures were obtained without intravenous contrast. COMPARISON:  Prior head CT from 02/15/2020. FINDINGS: Brain: Cerebral volume within normal limits for patient age. No focal parenchymal signal abnormality identified. No abnormal foci of restricted diffusion to suggest acute or subacute ischemia. Gray-white matter differentiation well maintained. No encephalomalacia to suggest chronic infarction. No foci of susceptibility artifact to suggest acute or chronic intracranial hemorrhage. No mass lesion, midline shift or mass effect. No hydrocephalus. No extra-axial fluid collection. Pituitary gland and suprasellar region are normal. Midline structures intact and normal. Vascular: Major intracranial vascular flow voids well maintained and normal in appearance. Skull and upper cervical spine: Craniocervical junction normal. Visualized  upper cervical spine within normal limits. Bone marrow signal intensity normal. No scalp soft tissue abnormality. Sinuses/Orbits: Globes and orbital soft tissues within normal limits. Left maxillary sinus retention cyst. Paranasal sinuses are otherwise clear. No mastoid effusion. Inner ear structures grossly normal. Other: None. IMPRESSION: Normal brain MRI. No acute intracranial abnormality identified. Electronically Signed   By: Rise MuBenjamin  McClintock M.D.   On: 02/16/2020 02:31   DG Chest Portable 1 View  Result Date: 02/15/2020 CLINICAL DATA:  Hypertension, left arm heaviness EXAM: PORTABLE CHEST 1 VIEW COMPARISON:  10/31/2019 FINDINGS: The heart size and mediastinal contours are within normal limits. Both lungs are clear. The visualized skeletal structures are unremarkable. IMPRESSION: No active  disease. Electronically Signed   By: Sharlet SalinaMichael  Brown M.D.   On: 02/15/2020 20:49   ECHOCARDIOGRAM COMPLETE  Result Date: 02/16/2020    ECHOCARDIOGRAM REPORT   Patient Name:   Mike Bowen Date of Exam: 02/16/2020 Medical Rec #:  [id number]      Height:       72.0 in Accession #:    [id number]     Weight:       265.0 lb Date of Birth:  06/01/1980       BSA:          2.401 m Patient Age:    39 years       BP:           160/99 mmHg Patient Gender: M              HR:           90 bpm. Exam Location:  Inpatient Procedure: 2D Echo, Cardiac Doppler and Color Doppler Indications:    Elevated Troponin.  History:        Patient has no prior history of Echocardiogram examinations.                 Risk Factors:Hypertension and Diabetes.  Sonographer:    Elmarie Shileyiffany Dance Referring Phys: [id number] VASUNDHRA RATHORE IMPRESSIONS  1. Left ventricular ejection fraction, by estimation, is 60 to 65%. The left ventricle has normal function. The left ventricle has no regional wall motion abnormalities. There is mild left ventricular hypertrophy. Left ventricular diastolic parameters are consistent with Grade I diastolic dysfunction (impaired relaxation).  2. Right ventricular systolic function is normal. The right ventricular size is normal.  3. Left atrial size was moderately dilated.  4. The mitral valve was not well visualized. No evidence of mitral valve regurgitation.  5. The aortic valve is tricuspid. Aortic valve regurgitation is not visualized.  6. The inferior vena cava is normal in size with greater than 50% respiratory variability, suggesting right atrial pressure of 3 mmHg. Comparison(s): No prior Echocardiogram. FINDINGS  Left Ventricle: Left ventricular ejection fraction, by estimation, is 60 to 65%. The left ventricle has normal function. The left ventricle has no regional wall motion abnormalities. The left ventricular internal cavity size was normal in size. There is  mild left ventricular hypertrophy. Left ventricular  diastolic parameters are consistent with Grade I diastolic dysfunction (impaired relaxation). Indeterminate filling pressures. Right Ventricle: The right ventricular size is normal. No increase in right ventricular wall thickness. Right ventricular systolic function is normal. Left Atrium: Left atrial size was moderately dilated. Right Atrium: Right atrial size was normal in size. Pericardium: There is no evidence of pericardial effusion. Mitral Valve: The mitral valve was not well visualized. No evidence of mitral valve regurgitation. Tricuspid Valve: The tricuspid valve is grossly normal. Tricuspid  valve regurgitation is trivial. Aortic Valve: The aortic valve is tricuspid. Aortic valve regurgitation is not visualized. Pulmonic Valve: The pulmonic valve was grossly normal. Pulmonic valve regurgitation is trivial. Aorta: The aortic root and ascending aorta are structurally normal, with no evidence of dilitation. Venous: The inferior vena cava is normal in size with greater than 50% respiratory variability, suggesting right atrial pressure of 3 mmHg. IAS/Shunts: No atrial level shunt detected by color flow Doppler.  LEFT VENTRICLE PLAX 2D LVIDd:         4.90 cm  Diastology LVIDs:         2.90 cm  LV e' medial:    4.35 cm/s LV PW:         1.40 cm  LV E/e' medial:  13.6 LV IVS:        1.30 cm  LV e' lateral:   4.68 cm/s LVOT diam:     2.40 cm  LV E/e' lateral: 12.7 LV SV:         97 LV SV Index:   41 LVOT Area:     4.52 cm  RIGHT VENTRICLE             IVC RV Basal diam:  2.90 cm     IVC diam: 1.60 cm RV S prime:     12.50 cm/s TAPSE (M-mode): 2.8 cm LEFT ATRIUM              Index       RIGHT ATRIUM           Index LA diam:        4.60 cm  1.92 cm/m  RA Area:     17.60 cm LA Vol (A2C):   104.0 ml 43.31 ml/m RA Volume:   44.70 ml  18.62 ml/m LA Vol (A4C):   106.0 ml 44.14 ml/m LA Biplane Vol: 109.0 ml 45.39 ml/m  AORTIC VALVE LVOT Vmax:   106.00 cm/s LVOT Vmean:  81.000 cm/s LVOT VTI:    0.215 m  AORTA Ao Root  diam: 3.60 cm Ao Asc diam:  2.90 cm MITRAL VALVE MV Area (PHT): 2.42 cm    SHUNTS MV Decel Time: 313 msec    Systemic VTI:  0.22 m MV E velocity: 59.30 cm/s  Systemic Diam: 2.40 cm MV A velocity: 71.40 cm/s MV E/A ratio:  0.83 Zoila Shutter MD Electronically signed by Zoila Shutter MD Signature Date/Time: 02/16/2020/12:18:58 PM    Final    Scheduled Meds: . amLODipine  10 mg Oral Daily  . enoxaparin (LOVENOX) injection  40 mg Subcutaneous Q24H  . furosemide  40 mg Intravenous Daily  . hydrALAZINE  25 mg Oral Q8H  . insulin aspart  0-5 Units Subcutaneous QHS  . insulin aspart  0-9 Units Subcutaneous TID WC  . insulin aspart  3 Units Subcutaneous TID WC  . insulin glargine  10 Units Subcutaneous Daily  . lisinopril  10 mg Oral Daily   Continuous Infusions:   LOS: 2 days    Time spent: 35 mins    Camdin Hegner, MD [facility]   If 7PM-7AM, please contact night-coverage

## 2020-02-18 DIAGNOSIS — R600 Localized edema: Secondary | ICD-10-CM

## 2020-02-18 DIAGNOSIS — I160 Hypertensive urgency: Secondary | ICD-10-CM

## 2020-02-18 LAB — GLUCOSE, CAPILLARY: Glucose-Capillary: 228 mg/dL — ABNORMAL HIGH (ref 70–99)

## 2020-02-18 LAB — BASIC METABOLIC PANEL
Anion gap: 9 (ref 5–15)
BUN: 13 mg/dL (ref 6–20)
CO2: 28 mmol/L (ref 22–32)
Calcium: 8.1 mg/dL — ABNORMAL LOW (ref 8.9–10.3)
Chloride: 101 mmol/L (ref 98–111)
Creatinine, Ser: 1.28 mg/dL — ABNORMAL HIGH (ref 0.61–1.24)
GFR, Estimated: >60 mL/min (ref >60)
Glucose, Bld: 205 mg/dL — ABNORMAL HIGH (ref 70–99)
Potassium: 3.3 mmol/L — ABNORMAL LOW (ref 3.5–5.1)
Sodium: 138 mmol/L (ref 135–145)

## 2020-02-18 LAB — CBC
HCT: 34.5 % — ABNORMAL LOW (ref 39.0–52.0)
Hemoglobin: 11.6 g/dL — ABNORMAL LOW (ref 13.0–17.0)
MCH: 26.5 pg (ref 26.0–34.0)
MCHC: 33.6 g/dL (ref 30.0–36.0)
MCV: 78.9 fL — ABNORMAL LOW (ref 80.0–100.0)
Platelets: 148 10*3/uL — ABNORMAL LOW (ref 150–400)
RBC: 4.37 MIL/uL (ref 4.22–5.81)
RDW: 12.7 % (ref 11.5–15.5)
WBC: 6.5 10*3/uL (ref 4.0–10.5)
nRBC: 0 % (ref 0.0–0.2)

## 2020-02-18 LAB — PHOSPHORUS: Phosphorus: 4.7 mg/dL — ABNORMAL HIGH (ref 2.5–4.6)

## 2020-02-18 LAB — MAGNESIUM: Magnesium: 1.8 mg/dL (ref 1.7–2.4)

## 2020-02-18 MED ORDER — LISINOPRIL 10 MG PO TABS: 10.0000 mg | ORAL_TABLET | Freq: Every day | ORAL | 0 refills | Status: DC

## 2020-02-18 MED ORDER — HYDRALAZINE HCL 25 MG PO TABS: 25.0000 mg | ORAL_TABLET | Freq: Three times a day (TID) | ORAL | 0 refills | Status: DC

## 2020-02-18 MED ORDER — METFORMIN HCL 500 MG PO TABS: 500.0000 mg | ORAL_TABLET | Freq: Two times a day (BID) | ORAL | 0 refills | Status: DC

## 2020-02-18 MED ORDER — AMLODIPINE BESYLATE 10 MG PO TABS: 10.0000 mg | ORAL_TABLET | Freq: Every day | ORAL | 0 refills | Status: DC

## 2020-02-18 NOTE — Progress Notes (Signed)
D/C instructions given and reviewed. No questions asked but encouraged to call with any concerns. Tele and IV removed, tolerated well. 

## 2020-02-18 NOTE — TOC Transition Note (Signed)
Transition of Care [facility]) - CM/SW Discharge Note   Patient Details  Name: Mike Bowen MRN: [id number] Date of Birth: March 26, 1980  Transition of Care [facility]) CM/SW Contact:  Leone Haven, RN Phone Number: 02/18/2020, 10:11 AM   Clinical Narrative:    NCM spoke with patient about his PCP, he would like to have follow up with [facility], NCM called and scheduled a follow up apt for him, it is listed on the AVS.   Final next level of care: Home/Self Care Barriers to Discharge: No Barriers Identified   Patient Goals and CMS Choice Patient states their goals for this hospitalization and ongoing recovery are:: get better , continue to eat better ,check blood pressure   Choice offered to / list presented to : NA  Discharge Placement                       Discharge Plan and Services                  DME Agency: NA       HH Arranged: NA          Social Determinants of Health (SDOH) Interventions     Readmission Risk Interventions No flowsheet data found.

## 2020-02-18 NOTE — Discharge Instructions (Signed)
You were cared for by a hospitalist during your hospital stay. If you have any questions about your discharge medications or the care you received while you were in the hospital after you are discharged, you can call the unit and asked to speak with the hospitalist on call if the hospitalist that took care of you is not available. Once you are discharged, your primary care physician will handle any further medical issues.   Please note that NO REFILLS for any discharge medications will be authorized once you are discharged, as it is imperative that you return to your primary care physician (or establish a relationship with a primary care physician if you do not have one) for your aftercare needs so that they can reassess your need for medications and monitor your lab values.  Please take all your medications with you for your next visit with your Primary MD. Please ask your Primary MD to get all Hospital records sent to his/her office. Please request your Primary MD to go over all hospital test results at the follow up.   If you experience worsening of your admission symptoms, develop shortness of breath, chest pain, suicidal or homicidal thoughts or a life threatening emergency, you must seek medical attention immediately by calling 911 or calling your MD.   Bonita Quin must read the complete instructions/literature along with all the possible adverse reactions/side effects for all the medicines you take including new medications that have been prescribed to you. Take new medicines after you have completely understood and accpet all the possible adverse reactions/side effects.    Do not drive when taking pain medications or sedatives.     Do not take more than prescribed Pain, Sleep and Anxiety Medications   If you have smoked or chewed Tobacco in the last 2 yrs please stop. Stop any regular alcohol  and or recreational drug use.   Wear Seat belts while driving.   DASH Eating Plan DASH stands for  "Dietary Approaches to Stop Hypertension." The DASH eating plan is a healthy eating plan that has been shown to reduce high blood pressure (hypertension). It may also reduce your risk for type 2 diabetes, heart disease, and stroke. The DASH eating plan may also help with weight loss. What are tips for following this plan?  General guidelines  Avoid eating more than 2,300 mg (milligrams) of salt (sodium) a day. If you have hypertension, you may need to reduce your sodium intake to 1,500 mg a day.  Limit alcohol intake to no more than 1 drink a day for nonpregnant women and 2 drinks a day for men. One drink equals 12 oz of beer, 5 oz of wine, or 1 oz of hard liquor.  Work with your health care provider to maintain a healthy body weight or to lose weight. Ask what an ideal weight is for you.  Get at least 30 minutes of exercise that causes your heart to beat faster (aerobic exercise) most days of the week. Activities may include walking, swimming, or biking.  Work with your health care provider or diet and nutrition specialist (dietitian) to adjust your eating plan to your individual calorie needs. Reading food labels   Check food labels for the amount of sodium per serving. Choose foods with less than 5 percent of the Daily Value of sodium. Generally, foods with less than 300 mg of sodium per serving fit into this eating plan.  To find whole grains, look for the word "whole" as the first word in  the ingredient list. Shopping  Buy products labeled as "low-sodium" or "no salt added."  Buy fresh foods. Avoid canned foods and premade or frozen meals. Cooking  Avoid adding salt when cooking. Use salt-free seasonings or herbs instead of table salt or sea salt. Check with your health care provider or pharmacist before using salt substitutes.  Do not fry foods. Cook foods using healthy methods such as baking, boiling, grilling, and broiling instead.  Cook with heart-healthy oils, such as olive,  canola, soybean, or sunflower oil. Meal planning  Eat a balanced diet that includes: ? 5 or more servings of fruits and vegetables each day. At each meal, try to fill half of your plate with fruits and vegetables. ? Up to 6-8 servings of whole grains each day. ? Less than 6 oz of lean meat, poultry, or fish each day. A 3-oz serving of meat is about the same size as a deck of cards. One egg equals 1 oz. ? 2 servings of low-fat dairy each day. ? A serving of nuts, seeds, or beans 5 times each week. ? Heart-healthy fats. Healthy fats called Omega-3 fatty acids are found in foods such as flaxseeds and coldwater fish, like sardines, salmon, and mackerel.  Limit how much you eat of the following: ? Canned or prepackaged foods. ? Food that is high in trans fat, such as fried foods. ? Food that is high in saturated fat, such as fatty meat. ? Sweets, desserts, sugary drinks, and other foods with added sugar. ? Full-fat dairy products.  Do not salt foods before eating.  Try to eat at least 2 vegetarian meals each week.  Eat more home-cooked food and less restaurant, buffet, and fast food.  When eating at a restaurant, ask that your food be prepared with less salt or no salt, if possible. What foods are recommended? The items listed may not be a complete list. Talk with your dietitian about what dietary choices are best for you. Grains Whole-grain or whole-wheat bread. Whole-grain or whole-wheat pasta. Brown rice. Orpah Cobbatmeal. Quinoa. Bulgur. Whole-grain and low-sodium cereals. Pita bread. Low-fat, low-sodium crackers. Whole-wheat flour tortillas. Vegetables Fresh or frozen vegetables (raw, steamed, roasted, or grilled). Low-sodium or reduced-sodium tomato and vegetable juice. Low-sodium or reduced-sodium tomato sauce and tomato paste. Low-sodium or reduced-sodium canned vegetables. Fruits All fresh, dried, or frozen fruit. Canned fruit in natural juice (without added sugar). Meat and other protein  foods Skinless chicken or [place]. Ground chicken or [place]. Pork with fat trimmed off. Fish and seafood. Egg whites. Dried beans, peas, or lentils. Unsalted nuts, nut butters, and seeds. Unsalted canned beans. Lean cuts of beef with fat trimmed off. Low-sodium, lean deli meat. Dairy Low-fat (1%) or fat-free (skim) milk. Fat-free, low-fat, or reduced-fat cheeses. Nonfat, low-sodium ricotta or cottage cheese. Low-fat or nonfat yogurt. Low-fat, low-sodium cheese. Fats and oils Soft margarine without trans fats. Vegetable oil. Low-fat, reduced-fat, or light mayonnaise and salad dressings (reduced-sodium). Canola, safflower, olive, soybean, and sunflower oils. Avocado. Seasoning and other foods Herbs. Spices. Seasoning mixes without salt. Unsalted popcorn and pretzels. Fat-free sweets. What foods are not recommended? The items listed may not be a complete list. Talk with your dietitian about what dietary choices are best for you. Grains Baked goods made with fat, such as croissants, muffins, or some breads. Dry pasta or rice meal packs. Vegetables Creamed or fried vegetables. Vegetables in a cheese sauce. Regular canned vegetables (not low-sodium or reduced-sodium). Regular canned tomato sauce and paste (not low-sodium or reduced-sodium). Regular  tomato and vegetable juice (not low-sodium or reduced-sodium). Rosita Fire. Olives. Fruits Canned fruit in a light or heavy syrup. Fried fruit. Fruit in cream or butter sauce. Meat and other protein foods Fatty cuts of meat. Ribs. Fried meat. Tomasa Blase. Sausage. Bologna and other processed lunch meats. Salami. Fatback. Hotdogs. Bratwurst. Salted nuts and seeds. Canned beans with added salt. Canned or smoked fish. Whole eggs or egg yolks. Chicken or [place] with skin. Dairy Whole or 2% milk, cream, and half-and-half. Whole or full-fat cream cheese. Whole-fat or sweetened yogurt. Full-fat cheese. Nondairy creamers. Whipped toppings. Processed cheese and cheese  spreads. Fats and oils Butter. Stick margarine. Lard. Shortening. Ghee. Bacon fat. Tropical oils, such as coconut, palm kernel, or palm oil. Seasoning and other foods Salted popcorn and pretzels. Onion salt, garlic salt, seasoned salt, table salt, and sea salt. Worcestershire sauce. Tartar sauce. Barbecue sauce. Teriyaki sauce. Soy sauce, including reduced-sodium. Steak sauce. Canned and packaged gravies. Fish sauce. Oyster sauce. Cocktail sauce. Horseradish that you find on the shelf. Ketchup. Mustard. Meat flavorings and tenderizers. Bouillon cubes. Hot sauce and Tabasco sauce. Premade or packaged marinades. Premade or packaged taco seasonings. Relishes. Regular salad dressings. Where to find more information:  National Heart, Lung, and Blood Institute: PopSteam.is  American Heart Association: www.heart.org Summary  The DASH eating plan is a healthy eating plan that has been shown to reduce high blood pressure (hypertension). It may also reduce your risk for type 2 diabetes, heart disease, and stroke.  With the DASH eating plan, you should limit salt (sodium) intake to 2,300 mg a day. If you have hypertension, you may need to reduce your sodium intake to 1,500 mg a day.  When on the DASH eating plan, aim to eat more fresh fruits and vegetables, whole grains, lean proteins, low-fat dairy, and heart-healthy fats.  Work with your health care provider or diet and nutrition specialist (dietitian) to adjust your eating plan to your individual calorie needs. This information is not intended to replace advice given to you by your health care provider. Make sure you discuss any questions you have with your health care provider. Document Revised: 01/26/2017 Document Reviewed: 02/07/2016 Elsevier Patient Education  2020 [employer].   Diabetes Mellitus and Nutrition, Adult When you have diabetes (diabetes mellitus), it is very important to have healthy eating habits because your blood sugar  (glucose) levels are greatly affected by what you eat and drink. Eating healthy foods in the appropriate amounts, at about the same times every day, can help you:  Control your blood glucose.  Lower your risk of heart disease.  Improve your blood pressure.  Reach or maintain a healthy weight. Every person with diabetes is different, and each person has different needs for a meal plan. Your health care provider may recommend that you work with a diet and nutrition specialist (dietitian) to make a meal plan that is best for you. Your meal plan may vary depending on factors such as:  The calories you need.  The medicines you take.  Your weight.  Your blood glucose, blood pressure, and cholesterol levels.  Your activity level.  Other health conditions you have, such as heart or kidney disease. How do carbohydrates affect me? Carbohydrates, also called carbs, affect your blood glucose level more than any other type of food. Eating carbs naturally raises the amount of glucose in your blood. Carb counting is a method for keeping track of how many carbs you eat. Counting carbs is important to keep your blood glucose  at a healthy level, especially if you use insulin or take certain oral diabetes medicines. It is important to know how many carbs you can safely have in each meal. This is different for every person. Your dietitian can help you calculate how many carbs you should have at each meal and for each snack. Foods that contain carbs include:  Bread, cereal, rice, pasta, and crackers.  Potatoes and corn.  Peas, beans, and lentils.  Milk and yogurt.  Fruit and juice.  Desserts, such as cakes, cookies, ice cream, and candy. How does alcohol affect me? Alcohol can cause a sudden decrease in blood glucose (hypoglycemia), especially if you use insulin or take certain oral diabetes medicines. Hypoglycemia can be a life-threatening condition. Symptoms of hypoglycemia (sleepiness,  dizziness, and confusion) are similar to symptoms of having too much alcohol. If your health care provider says that alcohol is safe for you, follow these guidelines:  Limit alcohol intake to no more than 1 drink per day for nonpregnant women and 2 drinks per day for men. One drink equals 12 oz of beer, 5 oz of wine, or 1 oz of hard liquor.  Do not drink on an empty stomach.  Keep yourself hydrated with water, diet soda, or unsweetened iced tea.  Keep in mind that regular soda, juice, and other mixers may contain a lot of sugar and must be counted as carbs. What are tips for following this plan?  Reading food labels  Start by checking the serving size on the "Nutrition Facts" label of packaged foods and drinks. The amount of calories, carbs, fats, and other nutrients listed on the label is based on one serving of the item. Many items contain more than one serving per package.  Check the total grams (g) of carbs in one serving. You can calculate the number of servings of carbs in one serving by dividing the total carbs by 15. For example, if a food has 30 g of total carbs, it would be equal to 2 servings of carbs.  Check the number of grams (g) of saturated and trans fats in one serving. Choose foods that have low or no amount of these fats.  Check the number of milligrams (mg) of salt (sodium) in one serving. Most people should limit total sodium intake to less than 2,300 mg per day.  Always check the nutrition information of foods labeled as "low-fat" or "nonfat". These foods may be higher in added sugar or refined carbs and should be avoided.  Talk to your dietitian to identify your daily goals for nutrients listed on the label. Shopping  Avoid buying canned, premade, or processed foods. These foods tend to be high in fat, sodium, and added sugar.  Shop around the outside edge of the grocery store. This includes fresh fruits and vegetables, bulk grains, fresh meats, and fresh  dairy. Cooking  Use low-heat cooking methods, such as baking, instead of high-heat cooking methods like deep frying.  Cook using healthy oils, such as olive, canola, or sunflower oil.  Avoid cooking with butter, cream, or high-fat meats. Meal planning  Eat meals and snacks regularly, preferably at the same times every day. Avoid going long periods of time without eating.  Eat foods high in fiber, such as fresh fruits, vegetables, beans, and whole grains. Talk to your dietitian about how many servings of carbs you can eat at each meal.  Eat 4-6 ounces (oz) of lean protein each day, such as lean meat, chicken, fish, eggs, or  tofu. One oz of lean protein is equal to: ? 1 oz of meat, chicken, or fish. ? 1 egg. ?  cup of tofu.  Eat some foods each day that contain healthy fats, such as avocado, nuts, seeds, and fish. Lifestyle  Check your blood glucose regularly.  Exercise regularly as told by your health care provider. This may include: ? 150 minutes of moderate-intensity or vigorous-intensity exercise each week. This could be brisk walking, biking, or water aerobics. ? Stretching and doing strength exercises, such as yoga or weightlifting, at least 2 times a week.  Take medicines as told by your health care provider.  Do not use any products that contain nicotine or tobacco, such as cigarettes and e-cigarettes. If you need help quitting, ask your health care provider.  Work with a Veterinary surgeon or diabetes educator to identify strategies to manage stress and any emotional and social challenges. Questions to ask a health care provider  Do I need to meet with a diabetes educator?  Do I need to meet with a dietitian?  What number can I call if I have questions?  When are the best times to check my blood glucose? Where to find more information:  American Diabetes Association: diabetes.org  Academy of Nutrition and Dietetics: www.eatright.[employer] of Diabetes and  Digestive and Kidney Diseases (NIH): CarFlippers.tn Summary  A healthy meal plan will help you control your blood glucose and maintain a healthy lifestyle.  Working with a diet and nutrition specialist (dietitian) can help you make a meal plan that is best for you.  Keep in mind that carbohydrates (carbs) and alcohol have immediate effects on your blood glucose levels. It is important to count carbs and to use alcohol carefully. This information is not intended to replace advice given to you by your health care provider. Make sure you discuss any questions you have with your health care provider. Document Revised: 01/26/2017 Document Reviewed: 03/20/2016 Elsevier Patient Education  2020 [employer].

## 2020-02-18 NOTE — Progress Notes (Signed)
Patient is walking to his car at discharge patient is ok with walking with no wheelchair.

## 2020-02-18 NOTE — Plan of Care (Signed)

## 2020-02-18 NOTE — Discharge Summary (Signed)
Physician Discharge Summary  Mike Bowen VVO:[id number] DOB: 27-Dec-1980 DOA: 02/15/2020  PCP: Patient, No Pcp Per  Admit date: 02/15/2020 Discharge date: 02/24/2020  Admitted From: home Disposition:  home   Recommendations for Outpatient Follow-up:  1. F/u BP as outpt  Home Health:  none  Discharge Condition:  stable   CODE STATUS:  Full code   Diet recommendation:  Heart healthy Consultations:  none  Procedures/Studies: . none   Discharge Diagnoses:  Principal Problem:   Hypertensive emergency Active Problems:   Arm heaviness   Type 2 diabetes mellitus (HCC)   Bilateral lower extremity edema     Brief Summary: This 39 years old male with PMH significant for hypertension, type 2 diabetes, noncompliant with medications and follow-up presents in the emergency department with headache, left arm heaviness and elevated blood pressure. Patient does not have a primary care physician and he ranout of his blood pressure medications for 2 months. He is found to have blood pressure 220/130 on arrival in the ED. CT head negative for acute intracranial abnormality. MRI unremarkable. Left arm heaviness and paresthesia has resolved. Neurology recommended outpatient TIA work-up. Patient found to have bilateral pedal edema, Echocardiogram: LVEF 60-65%.,He is started on IV Lasix 40 mg daily. Patient started on nitro drip for blood pressure .  BP improving, transitioned to oral blood pressure medications.  Hospital Course:  Hypertensive emergency:   Suspect due to antihypertensive medication nonadherence. Patient ran out of his blood pressure medications for 2 months. He was started on IV nitroglycerin infusion. Blood pressure currentlyimproving. High-sensitivity troponin mildly elevated but stable (32 >29).EKG without acute changes.  Patient denies chest pain. BNP normal. No signs of aortic dissection on chest x-ray. Blood pressure improving, transition to oral blood  pressure medications. Nitro drip discontinued.  Started patient on amlodipine 10 mg, hydralazine 25 mg 3 times daily. 12/21 : Lisinopril 10 mg daily added. Echocardiogram ; LVEF 60 to 65%.  There is no regional wall motion abnormalities. He will need close PCP follow-up after discharge along with medication assistance.  Left arm heavinessand paresthesias:>>> Resolved. Symptoms have now resolved. Head CT negative for acute intracranial abnormality. Brain MRI:  negative. Neurology recommending outpatient TIA work-up.  Bilateral lower extremity edema: BNP normal and no signs of pulmonary edema on chest imaging.  Echocardiogram : LVEF 60-65%. Renal function at baseline.    Treated with IV Lasix and improved.  Insulindependent type 2 diabetes: No prior A1c results in the chart.Patient reports taking insulin 70/30 mix 15 units TID with meals.     Discharge Exam: Vitals:   02/18/20 0508 02/18/20 0900  BP:  (!) 152/103  Pulse:  95  Resp: 18 20  Temp: 97.8 F (36.6 C) 98 F (36.7 C)  SpO2: 97% 99%   Vitals:   02/18/20 0000 02/18/20 0507 02/18/20 0508 02/18/20 0900  BP: 133/71 (!) 144/81  (!) 152/103  Pulse:    95  Resp: (!) 8 20 18 20   Temp: 98.2 F (36.8 C) 97.7 F (36.5 C) 97.8 F (36.6 C) 98 F (36.7 C)  TempSrc: Oral Oral  Oral  SpO2: 96% 98% 97% 99%  Weight:  113.4 kg    Height:        General: Pt is alert, awake, not in acute distress Cardiovascular: RRR, S1/S2 +, no rubs, no gallops Respiratory: CTA bilaterally, no wheezing, no rhonchi Abdominal: Soft, NT, ND, bowel sounds + Extremities: no edema, no cyanosis   Discharge Instructions  Discharge Instructions  Diet - low sodium heart healthy   Complete by: As directed    Increase activity slowly   Complete by: As directed      Allergies as of 02/18/2020   No Known Allergies     Medication List    STOP taking these medications   penicillin v potassium 500 MG tablet Commonly known  as: VEETID     TAKE these medications   amLODipine 10 MG tablet Commonly known as: NORVASC Take 1 tablet (10 mg total) by mouth daily.   hydrALAZINE 25 MG tablet Commonly known as: APRESOLINE Take 1 tablet (25 mg total) by mouth every 8 (eight) hours.   insulin aspart protamine- aspart (70-30) 100 UNIT/ML injection Commonly known as: NOVOLOG MIX 70/30 Inject 15 Units into the skin 3 (three) times daily as needed (high blood sugar).   lisinopril 10 MG tablet Commonly known as: ZESTRIL Take 1 tablet (10 mg total) by mouth daily.   metFORMIN 500 MG tablet Commonly known as: GLUCOPHAGE Take 1 tablet (500 mg total) by mouth 2 (two) times daily with a meal. What changed: how much to take       Follow-up Information    [facility] Follow up on 03/03/2020.   Why: 3:30 for follow up  Contact information: [address] [place] [address] [phone]              No Known Allergies    CT Head Wo Contrast  Result Date: 02/15/2020 CLINICAL DATA:  Headache EXAM: CT HEAD WITHOUT CONTRAST TECHNIQUE: Contiguous axial images were obtained from the base of the skull through the vertex without intravenous contrast. COMPARISON:  None. FINDINGS: Brain: No acute infarct or hemorrhage. Lateral ventricles and midline structures are unremarkable. No acute extra-axial fluid collections. No mass effect. Vascular: No hyperdense vessel or unexpected calcification. Skull: Normal. Negative for fracture or focal lesion. Sinuses/Orbits: No acute finding. Other: None. IMPRESSION: 1. No acute intracranial process. Electronically Signed   By: Sharlet Salina M.D.   On: 02/15/2020 20:37   MR BRAIN WO CONTRAST  Result Date: 02/16/2020 CLINICAL DATA:  Initial evaluation for acute TIA. EXAM: MRI HEAD WITHOUT CONTRAST TECHNIQUE: Multiplanar, multiecho pulse sequences of the brain and surrounding structures were obtained without intravenous contrast. COMPARISON:  Prior head CT from  02/15/2020. FINDINGS: Brain: Cerebral volume within normal limits for patient age. No focal parenchymal signal abnormality identified. No abnormal foci of restricted diffusion to suggest acute or subacute ischemia. Gray-white matter differentiation well maintained. No encephalomalacia to suggest chronic infarction. No foci of susceptibility artifact to suggest acute or chronic intracranial hemorrhage. No mass lesion, midline shift or mass effect. No hydrocephalus. No extra-axial fluid collection. Pituitary gland and suprasellar region are normal. Midline structures intact and normal. Vascular: Major intracranial vascular flow voids well maintained and normal in appearance. Skull and upper cervical spine: Craniocervical junction normal. Visualized upper cervical spine within normal limits. Bone marrow signal intensity normal. No scalp soft tissue abnormality. Sinuses/Orbits: Globes and orbital soft tissues within normal limits. Left maxillary sinus retention cyst. Paranasal sinuses are otherwise clear. No mastoid effusion. Inner ear structures grossly normal. Other: None. IMPRESSION: Normal brain MRI. No acute intracranial abnormality identified. Electronically Signed   By: Rise Mu M.D.   On: 02/16/2020 02:31   DG Chest Portable 1 View  Result Date: 02/15/2020 CLINICAL DATA:  Hypertension, left arm heaviness EXAM: PORTABLE CHEST 1 VIEW COMPARISON:  10/31/2019 FINDINGS: The heart size and mediastinal contours are within normal limits.  Both lungs are clear. The visualized skeletal structures are unremarkable. IMPRESSION: No active disease. Electronically Signed   By: Sharlet Salina M.D.   On: 02/15/2020 20:49   ECHOCARDIOGRAM COMPLETE  Result Date: 02/16/2020    ECHOCARDIOGRAM REPORT   Patient Name:   RESHAD SAAB Date of Exam: 02/16/2020 Medical Rec #:  [id number]      Height:       72.0 in Accession #:    [id number]     Weight:       265.0 lb Date of Birth:  [birth year]       BSA:          2.401  m Patient Age:    39 years       BP:           160/99 mmHg Patient Gender: M              HR:           90 bpm. Exam Location:  Inpatient Procedure: 2D Echo, Cardiac Doppler and Color Doppler Indications:    Elevated Troponin.  History:        Patient has no prior history of Echocardiogram examinations.                 Risk Factors:Hypertension and Diabetes.  Sonographer:    Elmarie Shiley Dance Referring Phys: [id number] VASUNDHRA RATHORE IMPRESSIONS  1. Left ventricular ejection fraction, by estimation, is 60 to 65%. The left ventricle has normal function. The left ventricle has no regional wall motion abnormalities. There is mild left ventricular hypertrophy. Left ventricular diastolic parameters are consistent with Grade I diastolic dysfunction (impaired relaxation).  2. Right ventricular systolic function is normal. The right ventricular size is normal.  3. Left atrial size was moderately dilated.  4. The mitral valve was not well visualized. No evidence of mitral valve regurgitation.  5. The aortic valve is tricuspid. Aortic valve regurgitation is not visualized.  6. The inferior vena cava is normal in size with greater than 50% respiratory variability, suggesting right atrial pressure of 3 mmHg. Comparison(s): No prior Echocardiogram. FINDINGS  Left Ventricle: Left ventricular ejection fraction, by estimation, is 60 to 65%. The left ventricle has normal function. The left ventricle has no regional wall motion abnormalities. The left ventricular internal cavity size was normal in size. There is  mild left ventricular hypertrophy. Left ventricular diastolic parameters are consistent with Grade I diastolic dysfunction (impaired relaxation). Indeterminate filling pressures. Right Ventricle: The right ventricular size is normal. No increase in right ventricular wall thickness. Right ventricular systolic function is normal. Left Atrium: Left atrial size was moderately dilated. Right Atrium: Right atrial size was normal in  size. Pericardium: There is no evidence of pericardial effusion. Mitral Valve: The mitral valve was not well visualized. No evidence of mitral valve regurgitation. Tricuspid Valve: The tricuspid valve is grossly normal. Tricuspid valve regurgitation is trivial. Aortic Valve: The aortic valve is tricuspid. Aortic valve regurgitation is not visualized. Pulmonic Valve: The pulmonic valve was grossly normal. Pulmonic valve regurgitation is trivial. Aorta: The aortic root and ascending aorta are structurally normal, with no evidence of dilitation. Venous: The inferior vena cava is normal in size with greater than 50% respiratory variability, suggesting right atrial pressure of 3 mmHg. IAS/Shunts: No atrial level shunt detected by color flow Doppler.  LEFT VENTRICLE PLAX 2D LVIDd:         4.90 cm  Diastology LVIDs:         2.90  cm  LV e' medial:    4.35 cm/s LV PW:         1.40 cm  LV E/e' medial:  13.6 LV IVS:        1.30 cm  LV e' lateral:   4.68 cm/s LVOT diam:     2.40 cm  LV E/e' lateral: 12.7 LV SV:         97 LV SV Index:   41 LVOT Area:     4.52 cm  RIGHT VENTRICLE             IVC RV Basal diam:  2.90 cm     IVC diam: 1.60 cm RV S prime:     12.50 cm/s TAPSE (M-mode): 2.8 cm LEFT ATRIUM              Index       RIGHT ATRIUM           Index LA diam:        4.60 cm  1.92 cm/m  RA Area:     17.60 cm LA Vol (A2C):   104.0 ml 43.31 ml/m RA Volume:   44.70 ml  18.62 ml/m LA Vol (A4C):   106.0 ml 44.14 ml/m LA Biplane Vol: 109.0 ml 45.39 ml/m  AORTIC VALVE LVOT Vmax:   106.00 cm/s LVOT Vmean:  81.000 cm/s LVOT VTI:    0.215 m  AORTA Ao Root diam: 3.60 cm Ao Asc diam:  2.90 cm MITRAL VALVE MV Area (PHT): 2.42 cm    SHUNTS MV Decel Time: 313 msec    Systemic VTI:  0.22 m MV E velocity: 59.30 cm/s  Systemic Diam: 2.40 cm MV A velocity: 71.40 cm/s MV E/A ratio:  0.83 Zoila Shutter MD Electronically signed by Zoila Shutter MD Signature Date/Time: 02/16/2020/12:18:58 PM    Final      The results of significant  diagnostics from this hospitalization (including imaging, microbiology, ancillary and laboratory) are listed below for reference.     Microbiology: Recent Results (from the past 240 hour(s))  Resp Panel by RT-PCR (Flu A&B, Covid) Nasopharyngeal Swab     Status: None   Collection Time: 02/15/20 10:42 PM   Specimen: Nasopharyngeal Swab; Nasopharyngeal(NP) swabs in vial transport medium  Result Value Ref Range Status   SARS Coronavirus 2 by RT PCR NEGATIVE NEGATIVE Final    Comment: (NOTE) SARS-CoV-2 target nucleic acids are NOT DETECTED.  The SARS-CoV-2 RNA is generally detectable in upper respiratory specimens during the acute phase of infection. The lowest concentration of SARS-CoV-2 viral copies this assay can detect is 138 copies/mL. A negative result does not preclude SARS-Cov-2 infection and should not be used as the sole basis for treatment or other patient management decisions. A negative result may occur with  improper specimen collection/handling, submission of specimen other than nasopharyngeal swab, presence of viral mutation(s) within the areas targeted by this assay, and inadequate number of viral copies(<138 copies/mL). A negative result must be combined with clinical observations, patient history, and epidemiological information. The expected result is Negative.  Fact Sheet for Patients:  BloggerCourse.com  Fact Sheet for Healthcare Providers:  SeriousBroker.it  This test is no t yet approved or cleared by the [place] FDA and  has been authorized for detection and/or diagnosis of SARS-CoV-2 by FDA under an Emergency Use Authorization (EUA). This EUA will remain  in effect (meaning this test can be used) for the duration of the COVID-19 declaration under Section 564(b)(1) of the Act, 21  U.S.C.section 360bbb-3(b)(1), unless the authorization is terminated  or revoked sooner.       Influenza A by PCR NEGATIVE  NEGATIVE Final   Influenza B by PCR NEGATIVE NEGATIVE Final    Comment: (NOTE) The Xpert Xpress SARS-CoV-2/FLU/RSV plus assay is intended as an aid in the diagnosis of influenza from Nasopharyngeal swab specimens and should not be used as a sole basis for treatment. Nasal washings and aspirates are unacceptable for Xpert Xpress SARS-CoV-2/FLU/RSV testing.  Fact Sheet for Patients: BloggerCourse.com  Fact Sheet for Healthcare Providers: SeriousBroker.it  This test is not yet approved or cleared by the [place] FDA and has been authorized for detection and/or diagnosis of SARS-CoV-2 by FDA under an Emergency Use Authorization (EUA). This EUA will remain in effect (meaning this test can be used) for the duration of the COVID-19 declaration under Section 564(b)(1) of the Act, 21 U.S.C. section 360bbb-3(b)(1), unless the authorization is terminated or revoked.  Performed at [facility], [address], [place], [place] [address]      Labs: BNP (last 3 results) Recent Labs    02/15/20 2114  BNP 73.9   Basic Metabolic Panel: Recent Labs  Lab 02/18/20 0319  NA 138  K 3.3*  CL 101  CO2 28  GLUCOSE 205*  BUN 13  CREATININE 1.28*  CALCIUM 8.1*  MG 1.8  PHOS 4.7*   Liver Function Tests: No results for input(s): AST, ALT, ALKPHOS, BILITOT, PROT, ALBUMIN in the last 168 hours. No results for input(s): LIPASE, AMYLASE in the last 168 hours. No results for input(s): AMMONIA in the last 168 hours. CBC: Recent Labs  Lab 02/18/20 0319  WBC 6.5  HGB 11.6*  HCT 34.5*  MCV 78.9*  PLT 148*   Cardiac Enzymes: No results for input(s): CKTOTAL, CKMB, CKMBINDEX, TROPONINI in the last 168 hours. BNP: Invalid input(s): POCBNP CBG: Recent Labs  Lab 02/17/20 1630 02/17/20 2100 02/18/20 0610  GLUCAP 148* 136* 228*   D-Dimer No results for input(s): DDIMER in the last 72 hours. Hgb A1c No results for input(s):  HGBA1C in the last 72 hours. Lipid Profile No results for input(s): CHOL, HDL, LDLCALC, TRIG, CHOLHDL, LDLDIRECT in the last 72 hours. Thyroid function studies No results for input(s): TSH, T4TOTAL, T3FREE, THYROIDAB in the last 72 hours.  Invalid input(s): FREET3 Anemia work up No results for input(s): VITAMINB12, FOLATE, FERRITIN, TIBC, IRON, RETICCTPCT in the last 72 hours. Urinalysis    Component Value Date/Time   COLORURINE YELLOW 02/15/2020 2114   APPEARANCEUR CLEAR 02/15/2020 2114   LABSPEC 1.009 02/15/2020 2114   PHURINE 6.0 02/15/2020 2114   GLUCOSEU NEGATIVE 02/15/2020 2114   HGBUR SMALL (A) 02/15/2020 2114   BILIRUBINUR NEGATIVE 02/15/2020 2114   KETONESUR NEGATIVE 02/15/2020 2114   PROTEINUR >=300 (A) 02/15/2020 2114   NITRITE NEGATIVE 02/15/2020 2114   LEUKOCYTESUR NEGATIVE 02/15/2020 2114   Sepsis Labs Invalid input(s): PROCALCITONIN,  WBC,  LACTICIDVEN Microbiology Recent Results (from the past 240 hour(s))  Resp Panel by RT-PCR (Flu A&B, Covid) Nasopharyngeal Swab     Status: None   Collection Time: 02/15/20 10:42 PM   Specimen: Nasopharyngeal Swab; Nasopharyngeal(NP) swabs in vial transport medium  Result Value Ref Range Status   SARS Coronavirus 2 by RT PCR NEGATIVE NEGATIVE Final    Comment: (NOTE) SARS-CoV-2 target nucleic acids are NOT DETECTED.  The SARS-CoV-2 RNA is generally detectable in upper respiratory specimens during the acute phase of infection. The lowest concentration of SARS-CoV-2 viral copies this  assay can detect is 138 copies/mL. A negative result does not preclude SARS-Cov-2 infection and should not be used as the sole basis for treatment or other patient management decisions. A negative result may occur with  improper specimen collection/handling, submission of specimen other than nasopharyngeal swab, presence of viral mutation(s) within the areas targeted by this assay, and inadequate number of viral copies(<138 copies/mL). A  negative result must be combined with clinical observations, patient history, and epidemiological information. The expected result is Negative.  Fact Sheet for Patients:  BloggerCourse.comhttps://www.fda.gov/media/152166/download  Fact Sheet for Healthcare Providers:  SeriousBroker.ithttps://www.fda.gov/media/152162/download  This test is no t yet approved or cleared by the [place] FDA and  has been authorized for detection and/or diagnosis of SARS-CoV-2 by FDA under an Emergency Use Authorization (EUA). This EUA will remain  in effect (meaning this test can be used) for the duration of the COVID-19 declaration under Section 564(b)(1) of the Act, 21 U.S.C.section 360bbb-3(b)(1), unless the authorization is terminated  or revoked sooner.       Influenza A by PCR NEGATIVE NEGATIVE Final   Influenza B by PCR NEGATIVE NEGATIVE Final    Comment: (NOTE) The Xpert Xpress SARS-CoV-2/FLU/RSV plus assay is intended as an aid in the diagnosis of influenza from Nasopharyngeal swab specimens and should not be used as a sole basis for treatment. Nasal washings and aspirates are unacceptable for Xpert Xpress SARS-CoV-2/FLU/RSV testing.  Fact Sheet for Patients: BloggerCourse.comhttps://www.fda.gov/media/152166/download  Fact Sheet for Healthcare Providers: SeriousBroker.ithttps://www.fda.gov/media/152162/download  This test is not yet approved or cleared by the [place] FDA and has been authorized for detection and/or diagnosis of SARS-CoV-2 by FDA under an Emergency Use Authorization (EUA). This EUA will remain in effect (meaning this test can be used) for the duration of the COVID-19 declaration under Section 564(b)(1) of the Act, 21 U.S.C. section 360bbb-3(b)(1), unless the authorization is terminated or revoked.  Performed at [facility], [address] [place], [place] [id number]      Time coordinating discharge in minutes: 65  SIGNED:   Calvert CantorSaima Peighton Edgin, MD  [facility] 02/24/2020, 3:33 PM

## 2020-11-18 ENCOUNTER — Encounter: Payer: Self-pay | Admitting: Orthopedic Surgery

## 2020-11-18 ENCOUNTER — Ambulatory Visit: Payer: BC Managed Care – PPO

## 2020-11-18 ENCOUNTER — Ambulatory Visit (INDEPENDENT_AMBULATORY_CARE_PROVIDER_SITE_OTHER): Payer: BC Managed Care – PPO | Admitting: Orthopedic Surgery

## 2020-11-18 ENCOUNTER — Other Ambulatory Visit: Payer: Self-pay

## 2020-11-18 VITALS — BP 157/94 | HR 81 | Ht 72.0 in | Wt 256.0 lb

## 2020-11-18 DIAGNOSIS — M170 Bilateral primary osteoarthritis of knee: Secondary | ICD-10-CM

## 2020-11-18 DIAGNOSIS — M25561 Pain in right knee: Secondary | ICD-10-CM | POA: Diagnosis not present

## 2020-11-18 DIAGNOSIS — M25461 Effusion, right knee: Secondary | ICD-10-CM

## 2020-11-18 DIAGNOSIS — M23321 Other meniscus derangements, posterior horn of medial meniscus, right knee: Secondary | ICD-10-CM

## 2020-11-18 DIAGNOSIS — M25562 Pain in left knee: Secondary | ICD-10-CM

## 2020-11-18 MED ORDER — PREDNISONE 10 MG PO TABS: 10.0000 mg | ORAL_TABLET | ORAL | 0 refills | Status: DC

## 2020-11-18 NOTE — Progress Notes (Signed)
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 and he does some recreational basketball presents with several month history of bilateral knee pain right worse than left associated with swelling catching locking giving way thought to have occurred when he injured his knee playing basketball about 3 months ago  He has been on ibuprofen he has used ice and activity modification but still has the pain and giving way when he tries to play basketball  Most of his pain is located over the medial joint line he has some crepitance in his left knee  Review of systems is negative  Past Medical History:  Diagnosis Date   Diabetes mellitus without complication (HCC)    Hypertension     BP (!) 157/94   Pulse 81   Ht 6' (1.829 m)   Wt 256 lb (116.1 kg)   BMI 34.72 kg/m   Bush is awake alert and oriented x3  His mood and affect are normal  His general appearance shows no gross abnormalities no deformities  He walks with a normal gait  His right knee has a large joint effusion he can extend he can flex it about 125 degrees it feels stable he has a positive McMurray's sign on the right  The left knee has severe patellofemoral crepitance no joint line pain no effusion full range of motion stable ligaments and normal strength  X-rays of both knees shows bilateral knee arthritis actually left worse than right with large right joint knee effusion  Recommend MRI to evaluate the effusion confirmed the meniscal tear on the right  He can start a steroid Dosepak until I see him back  .medsth Meds ordered this encounter  Medications   predniSONE (DELTASONE) 10 MG tablet    Sig: Take 1 tablet (10 mg total) by mouth as directed. D1: 3am 3 pm ; d2 3am and 2 pm ; d3 2 am and 2 pm ; d4 2 am 1 pm and d5 1 am 1 pm ; and d6 1 am    Dispense:  21  tablet    Refill:  0

## 2020-11-19 ENCOUNTER — Telehealth: Payer: Self-pay | Admitting: Radiology

## 2020-11-19 NOTE — Telephone Encounter (Signed)
Called him to tell him the MRI approved He is already scheduled I did fax orders.

## 2020-11-23 ENCOUNTER — Telehealth: Payer: Self-pay | Admitting: Orthopedic Surgery

## 2020-11-23 DIAGNOSIS — M7582 Other shoulder lesions, left shoulder: Secondary | ICD-10-CM | POA: Diagnosis not present

## 2020-11-23 DIAGNOSIS — M542 Cervicalgia: Secondary | ICD-10-CM | POA: Diagnosis not present

## 2020-11-23 NOTE — Telephone Encounter (Signed)
Patient called to ask about forms process; states he has [employer] through work, and has filed a claim. Discussed our forms process through [facility]; understands.

## 2020-11-29 ENCOUNTER — Ambulatory Visit: Payer: BC Managed Care – PPO | Admitting: Orthopedic Surgery

## 2020-12-03 DIAGNOSIS — M23341 Other meniscus derangements, anterior horn of lateral meniscus, right knee: Secondary | ICD-10-CM | POA: Diagnosis not present

## 2020-12-03 DIAGNOSIS — M948X6 Other specified disorders of cartilage, lower leg: Secondary | ICD-10-CM | POA: Diagnosis not present

## 2020-12-03 DIAGNOSIS — M1711 Unilateral primary osteoarthritis, right knee: Secondary | ICD-10-CM | POA: Diagnosis not present

## 2020-12-03 DIAGNOSIS — M23361 Other meniscus derangements, other lateral meniscus, right knee: Secondary | ICD-10-CM | POA: Diagnosis not present

## 2020-12-09 ENCOUNTER — Ambulatory Visit (INDEPENDENT_AMBULATORY_CARE_PROVIDER_SITE_OTHER): Payer: BC Managed Care – PPO | Admitting: Orthopedic Surgery

## 2020-12-09 ENCOUNTER — Other Ambulatory Visit: Payer: Self-pay

## 2020-12-09 ENCOUNTER — Encounter: Payer: Self-pay | Admitting: Orthopedic Surgery

## 2020-12-09 ENCOUNTER — Emergency Department (HOSPITAL_COMMUNITY)
Admission: EM | Admit: 2020-12-09 | Discharge: 2020-12-10 | Disposition: A | Discharge status: Home / Self Care | Payer: BC Managed Care – PPO | Attending: Emergency Medicine | Admitting: Emergency Medicine

## 2020-12-09 ENCOUNTER — Emergency Department (HOSPITAL_COMMUNITY): Payer: BC Managed Care – PPO

## 2020-12-09 ENCOUNTER — Encounter (HOSPITAL_COMMUNITY): Payer: Self-pay

## 2020-12-09 VITALS — Ht 72.0 in | Wt 256.0 lb

## 2020-12-09 DIAGNOSIS — I10 Essential (primary) hypertension: Secondary | ICD-10-CM | POA: Insufficient documentation

## 2020-12-09 DIAGNOSIS — E119 Type 2 diabetes mellitus without complications: Secondary | ICD-10-CM | POA: Insufficient documentation

## 2020-12-09 DIAGNOSIS — R29818 Other symptoms and signs involving the nervous system: Secondary | ICD-10-CM | POA: Diagnosis not present

## 2020-12-09 DIAGNOSIS — M25562 Pain in left knee: Secondary | ICD-10-CM | POA: Diagnosis not present

## 2020-12-09 DIAGNOSIS — Z79899 Other long term (current) drug therapy: Secondary | ICD-10-CM | POA: Diagnosis not present

## 2020-12-09 DIAGNOSIS — R791 Abnormal coagulation profile: Secondary | ICD-10-CM | POA: Diagnosis not present

## 2020-12-09 DIAGNOSIS — H5461 Unqualified visual loss, right eye, normal vision left eye: Secondary | ICD-10-CM | POA: Diagnosis not present

## 2020-12-09 DIAGNOSIS — H53131 Sudden visual loss, right eye: Secondary | ICD-10-CM | POA: Insufficient documentation

## 2020-12-09 DIAGNOSIS — H538 Other visual disturbances: Secondary | ICD-10-CM | POA: Diagnosis not present

## 2020-12-09 DIAGNOSIS — M23321 Other meniscus derangements, posterior horn of medial meniscus, right knee: Secondary | ICD-10-CM

## 2020-12-09 DIAGNOSIS — Z794 Long term (current) use of insulin: Secondary | ICD-10-CM | POA: Insufficient documentation

## 2020-12-09 DIAGNOSIS — H5711 Ocular pain, right eye: Secondary | ICD-10-CM | POA: Diagnosis not present

## 2020-12-09 DIAGNOSIS — Z7984 Long term (current) use of oral hypoglycemic drugs: Secondary | ICD-10-CM | POA: Diagnosis not present

## 2020-12-09 DIAGNOSIS — M25461 Effusion, right knee: Secondary | ICD-10-CM

## 2020-12-09 DIAGNOSIS — M25561 Pain in right knee: Secondary | ICD-10-CM

## 2020-12-09 LAB — CBC WITH DIFFERENTIAL/PLATELET
Abs Immature Granulocytes: 0.02 10*3/uL (ref 0.00–0.07)
Basophils Absolute: 0 10*3/uL (ref 0.0–0.1)
Basophils Relative: 0 %
Eosinophils Absolute: 0.1 10*3/uL (ref 0.0–0.5)
Eosinophils Relative: 1 %
HCT: 33.5 % — ABNORMAL LOW (ref 39.0–52.0)
Hemoglobin: 11.2 g/dL — ABNORMAL LOW (ref 13.0–17.0)
Immature Granulocytes: 0 %
Lymphocytes Relative: 21 %
Lymphs Abs: 1.4 10*3/uL (ref 0.7–4.0)
MCH: 27.1 pg (ref 26.0–34.0)
MCHC: 33.4 g/dL (ref 30.0–36.0)
MCV: 80.9 fL (ref 80.0–100.0)
Monocytes Absolute: 0.4 10*3/uL (ref 0.1–1.0)
Monocytes Relative: 6 %
Neutro Abs: 4.8 10*3/uL (ref 1.7–7.7)
Neutrophils Relative %: 72 %
Platelets: 150 10*3/uL (ref 150–400)
RBC: 4.14 MIL/uL — ABNORMAL LOW (ref 4.22–5.81)
RDW: 12.5 % (ref 11.5–15.5)
WBC: 6.7 10*3/uL (ref 4.0–10.5)
nRBC: 0 % (ref 0.0–0.2)

## 2020-12-09 LAB — COMPREHENSIVE METABOLIC PANEL
ALT: 19 U/L (ref 0–44)
AST: 24 U/L (ref 15–41)
Albumin: 2.7 g/dL — ABNORMAL LOW (ref 3.5–5.0)
Alkaline Phosphatase: 57 U/L (ref 38–126)
Anion gap: 7 (ref 5–15)
BUN: 34 mg/dL — ABNORMAL HIGH (ref 6–20)
CO2: 25 mmol/L (ref 22–32)
Calcium: 8.2 mg/dL — ABNORMAL LOW (ref 8.9–10.3)
Chloride: 102 mmol/L (ref 98–111)
Creatinine, Ser: 1.67 mg/dL — ABNORMAL HIGH (ref 0.61–1.24)
GFR, Estimated: 52 mL/min — ABNORMAL LOW (ref >60)
Glucose, Bld: 392 mg/dL — ABNORMAL HIGH (ref 70–99)
Potassium: 4 mmol/L (ref 3.5–5.1)
Sodium: 134 mmol/L — ABNORMAL LOW (ref 135–145)
Total Bilirubin: 0.7 mg/dL (ref 0.3–1.2)
Total Protein: 5.7 g/dL — ABNORMAL LOW (ref 6.5–8.1)

## 2020-12-09 LAB — PROTIME-INR
INR: 0.9 (ref 0.8–1.2)
Prothrombin Time: 11.8 seconds (ref 11.4–15.2)

## 2020-12-09 MED ORDER — MELOXICAM 7.5 MG PO TABS: 7.5000 mg | ORAL_TABLET | Freq: Every day | ORAL | 5 refills | Status: DC

## 2020-12-09 MED ORDER — TETRACAINE HCL 0.5 % OP SOLN
2.0000 [drp] | Freq: Once | OPHTHALMIC | Status: AC
  Administered 2020-12-09: 2 [drp] via OPHTHALMIC
  Filled 2020-12-09: qty 4

## 2020-12-09 NOTE — ED Triage Notes (Signed)
Pt presents to ED with c/o blurred vision to right eye x 3 days. Pt says he woke up Monday and had blurred vision to right eye. No drainage, no pain. Pt denies any other symptoms. Pt alert and oriented x 4. Able to move all extremities and ambulatory with steady gait.

## 2020-12-09 NOTE — ED Notes (Signed)
LEFT EYE- 20/30 RIGHT EYE- unable to test  pt states he cannot see anything out of the eye his vision is white/only see colors

## 2020-12-09 NOTE — Patient Instructions (Signed)
You have arthritis and lateral meniscus tear   If your symptoms get worse come back to schedule surgery

## 2020-12-09 NOTE — Progress Notes (Signed)
FOLLOW UP   Encounter Diagnoses  Name Primary?   Acute pain of left knee    Acute pain of right knee    Effusion, right knee    Derangement of posterior horn of medial meniscus of right knee Yes     Chief Complaint  Patient presents with   Results    Review R knee MRI    Currently his symptoms are mild he does have some difficulty going up steps  When we discussed possible meniscectomy he says he cannot be out of work for 4 weeks at this time  He also has diffuse arthritis with some high-grade partial-thickness cartilage loss on the lateral side which would lead to a knee replacement eventually  We discussed this as well  Currently we will continue with anti-inflammatory medication until his symptoms worsen or he can tolerate being out of work for 4 weeks   Mike Bowen had an MRI of his right knee with the presumptive diagnosis of torn medial meniscus.  My interpretation of the mri: High-grade degenerative tear lateral meniscus, arthritis all 3 compartments with near full-thickness cartilage loss to posterior lateral femoral condyle and tibial condyle   MRI Knee Right  WO IV Contrast  Anatomical Region Laterality Modality  Thigh -- Magnetic Resonance  Knee -- --  Leg -- --   Impression  IMPRESSION:  1. Diffuse degeneration and tear of the lateral meniscus.  2. Tricompartmental arthrosis. High-grade partial-thickness cartilage loss in the posterior half lateral femoral and tibial condyle.  3. Extensor tendinosis.  4. Moderate effusion.  5. Prepatellar edema/bursitis.    Electronically Signed by: Stephens November on 12/06/2020 10:38 AM Narrative  TECHNIQUE: Multiplanar, multisequence MR imaging of the right knee was performed without contrast.   COMPARISON: None   INDICATION: Pain in right knee   FINDINGS:  . Bones: No evidence of fracture, aggressive bone lesion, or osteonecrosis.  Osteophytes present involving all 3 compartments, greatest laterally. Mild  subchondral cystic change at the lateral tibial plateau. Bipartite patella.  . Soft tissues: Moderate anterior, lateral and medial subcutaneous edema. No fluid collections or masses. Minimal Baker's cyst fluid.  . Joints: Moderate effusion.  .  . Articular cartilage:        -Medial: Intact.        -Lateral: High-grade partial-thickness cartilage loss in the posterior third of the lateral tibial plateau and lateral femoral condyle. Sagittal image 7.        -Patellofemoral: Mild cartilage thinning in the patella and trochlea.  .  . Medial meniscus: Intact  . Lateral meniscus: Diffusely degenerated with reduced caliber and probably horizontal tear of the body and anterior horn.  . Anterior cruciate ligament: Intact  . Posterior cruciate ligament: Intact  . Lateral collateral ligamentous structures (including fibular collateral ligament, biceps femoris tendon, popliteus tendon, and posterolateral corner structures): Intact  . Medial collateral ligament: Intact  .  Marland Kitchen Extensor mechanism, including quadriceps tendon, medial and lateral patellar retinaculum, and patellar tendon: Mild quadriceps and moderate patellar tendinosis.. Procedure Note  Mike Bowen, Claudean Severance, MD - 12/06/2020  Formatting of this note might be different from the original.  TECHNIQUE: Multiplanar, multisequence MR imaging of the right knee was performed without contrast.   COMPARISON: None   INDICATION: Pain in right knee   FINDINGS:  . Bones: No evidence of fracture, aggressive bone lesion, or osteonecrosis.  Osteophytes present involving all 3 compartments, greatest laterally. Mild subchondral cystic change at the lateral tibial plateau. Bipartite patella.  Marland Kitchen  Soft tissues: Moderate anterior, lateral and medial subcutaneous edema. No fluid collections or masses. Minimal Baker's cyst fluid.  . Joints: Moderate effusion.  .  . Articular cartilage:        -Medial: Intact.        -Lateral: High-grade partial-thickness  cartilage loss in the posterior third of the lateral tibial plateau and lateral femoral condyle. Sagittal image 7.        -Patellofemoral: Mild cartilage thinning in the patella and trochlea.  .  . Medial meniscus: Intact  . Lateral meniscus: Diffusely degenerated with reduced caliber and probably horizontal tear of the body and anterior horn.  . Anterior cruciate ligament: Intact  . Posterior cruciate ligament: Intact  . Lateral collateral ligamentous structures (including fibular collateral ligament, biceps femoris tendon, popliteus tendon, and posterolateral corner structures): Intact  . Medial collateral ligament: Intact  .  Marland Kitchen Extensor mechanism, including quadriceps tendon, medial and lateral patellar retinaculum, and patellar tendon: Mild quadriceps and moderate patellar tendinosis..    IMPRESSION:  1. Diffuse degeneration and tear of the lateral meniscus.  2. Tricompartmental arthrosis. High-grade partial-thickness cartilage loss in the posterior half lateral femoral and tibial condyle.  3. Extensor tendinosis.  4. Moderate effusion.  5. Prepatellar edema/bursitis.    Electronically Signed by: Stephens November on 12/06/2020 10:38 AM Resulting Agency Comment  [id number] Exam End: 12/03/20

## 2020-12-10 ENCOUNTER — Ambulatory Visit (INDEPENDENT_AMBULATORY_CARE_PROVIDER_SITE_OTHER): Payer: BC Managed Care – PPO | Admitting: Ophthalmology

## 2020-12-10 ENCOUNTER — Encounter (INDEPENDENT_AMBULATORY_CARE_PROVIDER_SITE_OTHER): Payer: Self-pay | Admitting: Ophthalmology

## 2020-12-10 VITALS — BP 163/97 | HR 73

## 2020-12-10 DIAGNOSIS — H3581 Retinal edema: Secondary | ICD-10-CM

## 2020-12-10 DIAGNOSIS — H4311 Vitreous hemorrhage, right eye: Secondary | ICD-10-CM | POA: Diagnosis not present

## 2020-12-10 DIAGNOSIS — E113593 Type 2 diabetes mellitus with proliferative diabetic retinopathy without macular edema, bilateral: Secondary | ICD-10-CM | POA: Diagnosis not present

## 2020-12-10 DIAGNOSIS — H35033 Hypertensive retinopathy, bilateral: Secondary | ICD-10-CM | POA: Diagnosis not present

## 2020-12-10 DIAGNOSIS — I10 Essential (primary) hypertension: Secondary | ICD-10-CM | POA: Diagnosis not present

## 2020-12-10 DIAGNOSIS — H2513 Age-related nuclear cataract, bilateral: Secondary | ICD-10-CM | POA: Diagnosis not present

## 2020-12-10 NOTE — Progress Notes (Signed)
[facility] - Clinic Note  12/10/2020     CHIEF COMPLAINT Patient presents for Retina Evaluation   HISTORY OF PRESENT ILLNESS: Mike Bowen is a 40 y.o. male who presents to the clinic today for:   HPI     Retina Evaluation   In right eye.  Associated Symptoms Floaters.  Negative for Flashes and Pain.  I, the attending physician,  performed the HPI with the patient and updated documentation appropriately.        Comments   Retina eval per Dr. Vonna Kotyk for possible heme OD- Monday, pt woke up without vision OD.  He sees "floaters in vision, can see colors, but no details"  DM II since 2002 BS 157, 30 mins ago, 84 this am after seeing Dr. Vonna Kotyk A1C        Last edited by Rennis Chris, MD on 12/10/2020  4:15 PM.    Pt is here on the referral of Dr. Vonna Kotyk for [facility] OD, pt states Monday morning, he woke up and had no vision in his right eye other than colors and shapes, pt states he has had floaters before but they went away, so he thought these would do the same thing, pt went to the ED last night and saw Dr. Vonna Kotyk in his office this morning, pt endorses being diabetic and hypertensive, he states his BP was 151/86 at the ED, he states he has not been eating well and his blood sugar has been high, A1c was 8.6 in December 2021, he states he takes insulin and oral medication  Referring physician: Mia Creek, MD [address]  [place], [place] [address]  HISTORICAL INFORMATION:   Selected notes from the MEDICAL RECORD NUMBER Referred by Dr. Vonna Kotyk LEE: 10.14.22 Ocular Hx- possible heme from DM OD PMH- DM    CURRENT MEDICATIONS: No current outpatient medications on file. (Ophthalmic Drugs)   No current facility-administered medications for this visit. (Ophthalmic Drugs)   Current Outpatient Medications (Other)  Medication Sig   amLODipine (NORVASC) 10 MG tablet Take 1 tablet (10 mg total) by mouth daily.   chlorthalidone (HYGROTON) 25 MG tablet  Take 25 mg by mouth daily.   glipiZIDE (GLUCOTROL) 10 MG tablet Take 10 mg by mouth 2 (two) times daily.   insulin aspart protamine- aspart (NOVOLOG MIX 70/30) (70-30) 100 UNIT/ML injection Inject 15 Units into the skin 3 (three) times daily as needed (high blood sugar).   hydrALAZINE (APRESOLINE) 25 MG tablet Take 1 tablet (25 mg total) by mouth every 8 (eight) hours. (Patient not taking: Reported on 12/10/2020)   olmesartan (BENICAR) 40 MG tablet Take 20 mg by mouth daily. (Patient not taking: Reported on 12/10/2020)   No current facility-administered medications for this visit. (Other)   REVIEW OF SYSTEMS: ROS   Positive for: Endocrine, Eyes Negative for: Constitutional, Gastrointestinal, Neurological, Skin, Genitourinary, Musculoskeletal, HENT, Cardiovascular, Respiratory, Psychiatric, Allergic/Imm, Heme/Lymph Last edited by Joni Reining, COA on 12/10/2020  1:32 PM.     ALLERGIES No Known Allergies  PAST MEDICAL HISTORY Past Medical History:  Diagnosis Date   Diabetes mellitus without complication (HCC)    Hypertension    Past Surgical History:  Procedure Laterality Date   SHOULDER SURGERY     FAMILY HISTORY Family History  Problem Relation Age of Onset   Diabetes Other    SOCIAL HISTORY Social History   Tobacco Use   Smoking status: Never   Smokeless tobacco: Never  Substance Use Topics  Alcohol use: Yes   Drug use: Yes    Types: Marijuana       OPHTHALMIC EXAM:  Base Eye Exam     Visual Acuity (Snellen - Linear)       Right Left   Dist [place] HM 3' 20/60 +2   Dist ph [place] NI 20/30 +1  OD unable to count fingers        Tonometry (Tonopen, 1:41 PM)       Right Left   Pressure 15 12         Pupils       Dark Light Shape React APD   Right 4 3 Round Brisk None   Left 5 4.5 sl dilated Minimal None         Visual Fields (Counting fingers)       Left Right    Full Full         Extraocular Movement       Right Left    Full Full          Neuro/Psych     Oriented x3: Yes   Mood/Affect: Normal         Dilation     Both eyes: 1.0% Mydriacyl, 2.5% Phenylephrine @ 1:38 PM           Slit Lamp and Fundus Exam     External Exam       Right Left   External Normal Normal         Slit Lamp Exam       Right Left   Lids/Lashes Dermatochalasis - upper lid Dermatochalasis - upper lid   Conjunctiva/Sclera mild melanosis mild melanosis   Cornea 1+ Punctate epithelial erosions 1+ Punctate epithelial erosions   Anterior Chamber Deep and quiet Deep and quiet   Iris Round and dilated, No NVI Round and dilated, No NVI   Lens RBC's on posterior capsule 1-2+ Nuclear sclerosis, 1-2+ Cortical cataract   Vitreous diffuse VH diffuse VH         Fundus Exam       Right Left   Disc no view Pink and Sharp, mild fibrosis, early NVD   Macula no view Flat, Good foveal reflex, scattered MA/IRH, scattered, fine exudates   Vessels no view attenuated, mild [place]pper wiring, mild tortuousity, peripheral sclerosis, focal NV inferior to disc   Periphery no view Attached              Refraction     Manifest Refraction       Sphere Cylinder Dist VA   Right [place]  NI   Left -1.00 Sphere 20/25-2            IMAGING AND PROCEDURES  Imaging and Procedures for 12/10/2020  OCT, Retina - OU - Both Eyes       Right Eye Progression has (No view).   Left Eye Quality was good. Central Foveal Thickness: 271. Progression has no prior data. Findings include normal foveal contour, no IRF, no SRF, vitreomacular adhesion , intraretinal hyper-reflective material (Irregular lamination, mild IRHM, no frank edema).   Notes *Images captured and stored on drive  Diagnosis / Impression:  OD: no view OS: NFP, no IRF/SRF -- Irregular lamination, mild IRHM, no frank edema  Clinical management:  See below  Abbreviations: NFP - Normal foveal profile. CME - cystoid macular edema. PED - pigment epithelial detachment. IRF -  intraretinal fluid. SRF - subretinal fluid. EZ - ellipsoid zone. ERM - epiretinal membrane. ORA -  outer retinal atrophy. ORT - outer retinal tubulation. SRHM - subretinal hyper-reflective material. IRHM - intraretinal hyper-reflective material      B-Scan Ultrasound - OD - Right Eye       Quality was good. Findings included vitreous opacities, vitreous hemorrhage.   Notes **Images stored on drive**  Impression: OD: vitreous opacities consistent with hemorrhage; no obvious RT/RD or mass           ASSESSMENT/PLAN:   ICD-10-CM   1. 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 Vitreous hemorrhage of right eye (HCC)  H43.11 B-Scan Ultrasound - OD - Right Eye    3. Retinal edema  H35.81 OCT, Retina - OU - Both Eyes    4. Essential hypertension  I10     5. Hypertensive retinopathy of both eyes  H35.033     1-3. Proliferative diabetic retinopathy w/o DME OU  - OD with dense VH  - OS with focal patches of preretinal fibrosis, +NVE  - last A1c 8.6% on 12.20.21  - was seen this morning as an ED consult by Dr. Vonna Kotyk for vision loss OD -- onset was upon waking Monday morning (10.10.22) - The incidence, risk factors for progression, natural history and treatment options for diabetic retinopathy were discussed with patient.   - The need for close monitoring of blood glucose, blood pressure, and serum lipids, avoiding cigarette or any type of tobacco, and the need for long term follow up was also discussed with patient. - exam shows diffuse VH OD, OS with scattered NV - b-scan u/s OD 10.14.22 -- mobile vitreous opacities consistent with VH, no RT/RD or mass - OCT without diabetic macular edema OS - discussed findings and prognosis - recommend IVA OD #1 today, 10.14.22, and return next week for PRP OS - pt unable to proceed with treatment - pt requests referral to [facility] for further  evaluation and management  4,5. Hypertensive retinopathy OU - discussed importance of tight BP control - monitor  Ophthalmic Meds Ordered this visit:  No orders of the defined types were placed in this encounter.    Return in about 4 days (around 12/14/2020) for f/u PDR OU, DFE, OCT, FA.  There are no Patient Instructions on file for this visit.  Explained the diagnoses, plan, and follow up with the patient and they expressed understanding.  Patient expressed understanding of the importance of proper follow up care.   This document serves as a record of services personally performed by Karie Chimera, MD, PhD. It was created on their behalf by Glee Arvin. Manson Passey, OA an ophthalmic technician. The creation of this record is the provider's dictation and/or activities during the visit.    Electronically signed by: Glee Arvin. Manson Passey, [place] 10.14.2022 4:26 PM  Karie Chimera, M.D., Ph.D. Diseases & Surgery of the Retina and Vitreous [facility] [place] 12/10/2020  I have reviewed the above documentation for accuracy and completeness, and I agree with the above. Karie Chimera, M.D., Ph.D. 12/10/20 4:26 PM  Abbreviations: M myopia (nearsighted); A astigmatism; H hyperopia (farsighted); P presbyopia; Mrx spectacle prescription;  CTL contact lenses; OD right eye; OS left eye; OU both eyes  XT exotropia; ET esotropia; PEK punctate epithelial keratitis; PEE punctate epithelial erosions; DES dry eye syndrome; MGD meibomian gland dysfunction; ATs artificial tears; PFAT's preservative free artificial tears; NSC nuclear sclerotic cataract; PSC posterior subcapsular cataract; ERM  epi-retinal membrane; PVD posterior vitreous detachment; RD retinal detachment; DM diabetes mellitus; DR diabetic retinopathy; NPDR non-proliferative diabetic retinopathy; PDR proliferative diabetic retinopathy; CSME clinically significant macular edema; DME diabetic macular edema; dbh dot blot hemorrhages; CWS cotton  wool spot; POAG primary open angle glaucoma; C/D cup-to-disc ratio; HVF humphrey visual field; GVF goldmann visual field; OCT optical coherence tomography; IOP intraocular pressure; BRVO Branch retinal vein occlusion; CRVO central retinal vein occlusion; CRAO central retinal artery occlusion; BRAO branch retinal artery occlusion; RT retinal tear; SB scleral buckle; PPV pars plana vitrectomy; VH Vitreous hemorrhage; PRP panretinal laser photocoagulation; IVK intravitreal kenalog; VMT vitreomacular traction; MH Macular hole;  NVD neovascularization of the disc; NVE neovascularization elsewhere; AREDS age related eye disease study; ARMD age related macular degeneration; POAG primary open angle glaucoma; EBMD epithelial/anterior basement membrane dystrophy; ACIOL anterior chamber intraocular lens; IOL intraocular lens; PCIOL posterior chamber intraocular lens; Phaco/IOL phacoemulsification with intraocular lens placement; PRK photorefractive keratectomy; LASIK laser assisted in situ keratomileusis; HTN hypertension; DM diabetes mellitus; COPD chronic obstructive pulmonary disease

## 2020-12-10 NOTE — ED Provider Notes (Signed)
[facility] EMERGENCY DEPARTMENT Provider Note   CSN: [id number] Arrival date & time: 12/09/20  2158     History Chief Complaint  Patient presents with   Blurred Vision    Right eye x 3 days    Mike Bowen is a 40 y.o. male.  The history is provided by the patient.  Eye Problem Location:  Right eye Chronicity:  New Relieved by:  None tried Worsened by:  Nothing Associated symptoms: blurred vision and decreased vision   Associated symptoms: no crusting, no discharge, no double vision, no headaches, no itching, no nausea, no numbness, no photophobia, no redness, no tearing, no vomiting and no weakness   Patient with history of diabetes and hypertension presents with vision loss in right eye.  Patient reports that approximately 3 days ago he woke up with blurred vision and floaters in the right eye.  He reports he has had floaters previously but this is the worst episode.  No eye pain.  No fevers or vomiting.  No eye drainage.  No eye trauma.  He does not wear corrective lenses. He denies any focal weakness in his arms or legs.    Past Medical History:  Diagnosis Date   Diabetes mellitus without complication (HCC)    Hypertension     Patient Active Problem List   Diagnosis Date Noted   Arm heaviness 02/16/2020   Type 2 diabetes mellitus (HCC) 02/16/2020   Bilateral lower extremity edema 02/16/2020   Hypertensive emergency 02/15/2020    Past Surgical History:  Procedure Laterality Date   SHOULDER SURGERY         Family History  Problem Relation Age of Onset   Diabetes Other     Social History   Tobacco Use   Smoking status: Never   Smokeless tobacco: Never  Substance Use Topics   Alcohol use: Yes   Drug use: Yes    Types: Marijuana    Home Medications Prior to Admission medications   Medication Sig Start Date End Date Taking? Authorizing Provider  amLODipine (NORVASC) 10 MG tablet Take 1 tablet (10 mg total) by mouth daily. 02/19/20   Calvert Cantor, MD  chlorthalidone (HYGROTON) 25 MG tablet Take 25 mg by mouth daily. 08/19/20   [provider]  glipiZIDE (GLUCOTROL) 10 MG tablet Take 10 mg by mouth 2 (two) times daily. 11/07/20   [provider]  hydrALAZINE (APRESOLINE) 25 MG tablet Take 1 tablet (25 mg total) by mouth every 8 (eight) hours. 02/18/20   Calvert Cantor, MD  insulin aspart protamine- aspart (NOVOLOG MIX 70/30) (70-30) 100 UNIT/ML injection Inject 15 Units into the skin 3 (three) times daily as needed (high blood sugar).    [provider]  meloxicam (MOBIC) 7.5 MG tablet Take 1 tablet (7.5 mg total) by mouth daily. 12/09/20   Vickki Hearing, MD  olmesartan (BENICAR) 40 MG tablet Take 20 mg by mouth daily. 08/19/20   [provider]    Allergies    Patient has no known allergies.  Review of Systems   Review of Systems  Constitutional:  Negative for fever.  Eyes:  Positive for blurred vision and visual disturbance. Negative for double vision, photophobia, pain, discharge, redness and itching.  Respiratory:  Negative for shortness of breath.   Cardiovascular:  Negative for chest pain.  Gastrointestinal:  Negative for nausea and vomiting.  Neurological:  Negative for speech difficulty, weakness, numbness and headaches.  All other systems reviewed and are negative.  Physical  Exam Updated Vital Signs BP (!) 151/86   Pulse 74   Temp 98.1 F (36.7 C) (Oral)   Resp 17   Ht 1.829 m (6')   Wt 119.7 kg   SpO2 99%   BMI 35.80 kg/m   Physical Exam CONSTITUTIONAL: Well developed/well nourished HEAD: Normocephalic/atraumatic EYES: EOMI/PERRL but pinpoint, no nystagmus,  no ptosis, IOP OD 18, OS 16; no foreign bodies.  No conjunctival erythema.  No corneal haziness. Visual acuity is noted ENMT: Mucous membranes moist NECK: supple no meningeal signs, no bruits CV: S1/S2 noted, no murmurs/rubs/gallops noted LUNGS: Lungs are clear to auscultation bilaterally, no apparent  distress ABDOMEN: soft, nontender, no rebound or guarding GU:no cva tenderness NEURO:Awake/alert, face symmetric, no arm or leg drift is noted Equal 5/5 strength with shoulder abduction, elbow flex/extension, wrist flex/extension in upper extremities and equal hand grips bilaterally Equal 5/5 strength with hip flexion,knee flex/extension, foot dorsi/plantar flexion Cranial nerves 3/4/5/6/09/04/08/11/12 tested and intact Gait normal without ataxia No past pointing Sensation to light touch intact in all extremities EXTREMITIES: pulses normal, full ROM SKIN: warm, color normal PSYCH: no abnormalities of mood noted  ED Results / Procedures / Treatments   Labs (all labs ordered are listed, but only abnormal results are displayed) Labs Reviewed  COMPREHENSIVE METABOLIC PANEL - Abnormal; Notable for the following components:      Result Value   Sodium 134 (*)    Glucose, Bld 392 (*)    BUN 34 (*)    Creatinine, Ser 1.67 (*)    Calcium 8.2 (*)    Total Protein 5.7 (*)    Albumin 2.7 (*)    GFR, Estimated 52 (*)    All other components within normal limits  CBC WITH DIFFERENTIAL/PLATELET - Abnormal; Notable for the following components:   RBC 4.14 (*)    Hemoglobin 11.2 (*)    HCT 33.5 (*)    All other components within normal limits  PROTIME-INR    EKG EKG Interpretation  Date/Time:  Thursday December 09 2020 23:32:04 EDT Ventricular Rate:  74 PR Interval:  167 QRS Duration: 92 QT Interval:  373 QTC Calculation: 414 R Axis:   96 Text Interpretation: Sinus rhythm Borderline right axis deviation Confirmed by Zadie Rhine ([id number]) on 12/09/2020 11:41:17 PM  Radiology CT HEAD WO CONTRAST ( )  Result Date: 12/09/2020 CLINICAL DATA:  Neurological deficit, acute, stroke suspected. Blurred vision to the right eye for 3 days. EXAM: CT HEAD WITHOUT CONTRAST TECHNIQUE: Contiguous axial images were obtained from the base of the skull through the vertex without intravenous contrast.  COMPARISON:  CT 02/15/2020.  MRI 02/16/2020 FINDINGS: Brain: No evidence of acute infarction, hemorrhage, hydrocephalus, extra-axial collection or mass lesion/mass effect. No sellar mass or expansion. Vascular: No hyperdense vessel or unexpected calcification. Skull: Calvarium appears intact. Sinuses/Orbits: Paranasal sinuses and mastoid air cells are clear. Other: No significant changes since the previous studies. IMPRESSION: No acute intracranial abnormalities. Electronically Signed   By: Burman Nieves M.D.   On: 12/09/2020 22:42    Procedures Procedures   Medications Ordered in ED Medications  tetracaine (PONTOCAINE) 0.5 % ophthalmic solution 2 drop (has no administration in time range)    ED Course  I have reviewed the triage vital signs and the nursing notes.  Pertinent labs & imaging results that were available during my care of the patient were reviewed by me and considered in my medical decision making (see chart for details).    MDM Rules/Calculators/A&P  Patient presented with monocular vision loss for up to 3 days that was associated with floaters.  Strong suspicion this was a retinal detachment or possible vitreous hemorrhage Initial work-up revealed a negative CT head that I personally reviewed.  Labs reveal hyperglycemia without anion gap and mild renal insufficiency.  Discussed the case with Dr. Vonna Kotyk with ophthalmology.  We discussed the case in total, and he agrees that this is possibly a retinal detachment. He recommends patient discharging patient home and he will see the patient at 7 AM later this morning.  This was discussed with patient and I urged him to present at 7 AM at the ophthalmologist office.   Patient had no other acute findings.  Specifically he had no focal neurodeficits to suggest acute stroke.  Final Clinical Impression(s) / ED Diagnoses Final diagnoses:  Vision loss of right eye    Rx / DC Orders ED Discharge Orders      None        Zadie Rhine, MD 12/10/20 0109

## 2020-12-14 ENCOUNTER — Encounter (INDEPENDENT_AMBULATORY_CARE_PROVIDER_SITE_OTHER): Payer: BC Managed Care – PPO | Admitting: Ophthalmology

## 2020-12-14 DIAGNOSIS — H35371 Puckering of macula, right eye: Secondary | ICD-10-CM | POA: Diagnosis not present

## 2020-12-14 DIAGNOSIS — H4311 Vitreous hemorrhage, right eye: Secondary | ICD-10-CM | POA: Diagnosis not present

## 2020-12-14 DIAGNOSIS — Z794 Long term (current) use of insulin: Secondary | ICD-10-CM | POA: Diagnosis not present

## 2020-12-14 DIAGNOSIS — E113513 Type 2 diabetes mellitus with proliferative diabetic retinopathy with macular edema, bilateral: Secondary | ICD-10-CM | POA: Diagnosis not present

## 2020-12-21 DIAGNOSIS — M19012 Primary osteoarthritis, left shoulder: Secondary | ICD-10-CM | POA: Diagnosis not present

## 2020-12-21 DIAGNOSIS — M7582 Other shoulder lesions, left shoulder: Secondary | ICD-10-CM | POA: Diagnosis not present

## 2020-12-21 DIAGNOSIS — M542 Cervicalgia: Secondary | ICD-10-CM | POA: Diagnosis not present

## 2021-01-04 DIAGNOSIS — Z794 Long term (current) use of insulin: Secondary | ICD-10-CM | POA: Diagnosis not present

## 2021-01-04 DIAGNOSIS — E113513 Type 2 diabetes mellitus with proliferative diabetic retinopathy with macular edema, bilateral: Secondary | ICD-10-CM | POA: Diagnosis not present

## 2021-01-04 DIAGNOSIS — H4311 Vitreous hemorrhage, right eye: Secondary | ICD-10-CM | POA: Diagnosis not present

## 2021-02-18 ENCOUNTER — Encounter (HOSPITAL_COMMUNITY): Payer: Self-pay | Admitting: Emergency Medicine

## 2021-02-18 ENCOUNTER — Emergency Department (HOSPITAL_COMMUNITY)
Admission: EM | Admit: 2021-02-18 | Discharge: 2021-02-18 | Disposition: A | Discharge status: Home / Self Care | Payer: BC Managed Care – PPO | Attending: Emergency Medicine | Admitting: Emergency Medicine

## 2021-02-18 ENCOUNTER — Other Ambulatory Visit: Payer: Self-pay

## 2021-02-18 DIAGNOSIS — Z7984 Long term (current) use of oral hypoglycemic drugs: Secondary | ICD-10-CM | POA: Insufficient documentation

## 2021-02-18 DIAGNOSIS — Z79899 Other long term (current) drug therapy: Secondary | ICD-10-CM | POA: Insufficient documentation

## 2021-02-18 DIAGNOSIS — Z20822 Contact with and (suspected) exposure to covid-19: Secondary | ICD-10-CM | POA: Insufficient documentation

## 2021-02-18 DIAGNOSIS — R1033 Periumbilical pain: Secondary | ICD-10-CM | POA: Insufficient documentation

## 2021-02-18 DIAGNOSIS — J101 Influenza due to other identified influenza virus with other respiratory manifestations: Secondary | ICD-10-CM

## 2021-02-18 DIAGNOSIS — E119 Type 2 diabetes mellitus without complications: Secondary | ICD-10-CM | POA: Insufficient documentation

## 2021-02-18 DIAGNOSIS — I10 Essential (primary) hypertension: Secondary | ICD-10-CM | POA: Insufficient documentation

## 2021-02-18 LAB — RESP PANEL BY RT-PCR (FLU A&B, COVID) ARPGX2
Influenza A by PCR: POSITIVE — AB
Influenza B by PCR: NEGATIVE
SARS Coronavirus 2 by RT PCR: NEGATIVE

## 2021-02-18 MED ORDER — LIDOCAINE VISCOUS HCL 2 % MT SOLN
15.0000 mL | Freq: Once | OROMUCOSAL | Status: AC
Start: 2021-02-18 — End: 2021-02-18
  Administered 2021-02-18: 13:00:00 15 mL via ORAL
  Filled 2021-02-18: qty 15

## 2021-02-18 MED ORDER — AMLODIPINE BESYLATE 5 MG PO TABS
10.0000 mg | ORAL_TABLET | Freq: Once | ORAL | Status: AC
  Administered 2021-02-18: 13:00:00 10 mg via ORAL
  Filled 2021-02-18: qty 2

## 2021-02-18 MED ORDER — ALUM & MAG HYDROXIDE-SIMETH 200-200-20 MG/5ML PO SUSP
30.0000 mL | Freq: Once | ORAL | Status: AC
Start: 2021-02-18 — End: 2021-02-18
  Administered 2021-02-18: 13:00:00 30 mL via ORAL
  Filled 2021-02-18: qty 30

## 2021-02-18 MED ORDER — HYDRALAZINE HCL 25 MG PO TABS
25.0000 mg | ORAL_TABLET | Freq: Once | ORAL | Status: AC
  Administered 2021-02-18: 13:00:00 25 mg via ORAL
  Filled 2021-02-18: qty 1

## 2021-02-18 MED ORDER — ONDANSETRON 4 MG PO TBDP
4.0000 mg | ORAL_TABLET | Freq: Once | ORAL | Status: AC
  Administered 2021-02-18: 13:00:00 4 mg via ORAL
  Filled 2021-02-18: qty 1

## 2021-02-18 NOTE — Discharge Instructions (Signed)
You have the flu.  Expect URI symptoms to last for 14-21 days. Dry cough can last up to 4 weeks. Covid test will result in 24 hours, check on my chart. You should receive call if result is positive. To help yourself recover, drink plenty of fluids and get plenty of rest.   For fever, HA, sore throat or body aches - take tylenol (500-100 mg) every 8 hours. Do not exceed 3000mg /day. You ca also take ibuprofen.   Return if things worsen or change.

## 2021-02-18 NOTE — ED Provider Notes (Signed)
[facility] EMERGENCY DEPARTMENT Provider Note   CSN: [id number] Arrival date & time: 02/18/21  1158     History Chief Complaint  Patient presents with   Abdominal Pain    C/o Abdominal pain, fever nausea and cough since Wednesday.      Mike Bowen is a 40 y.o. male.  HPI  Patient presents with abdominal pain, fever, nausea and cough x3 days.  He has not tried anything at home to help alleviate the symptoms.  Unable to identify provoking symptoms.  Reports his daughter is sick at home with the flu, he started feeling bad around the same time but has not been tested yet.  He is complaining of periumbilical abdominal pain that was worse yesterday, its improving with time.  Feels like a cramping pain, is constant and does not radiate elsewhere.  Initially he had nausea, vomiting and diarrhea, has not had any vomiting or diarrhea today but does feel nauseated.  No prior abdominal procedures.  Past Medical History:  Diagnosis Date   Diabetes mellitus without complication (HCC)    Hypertension     Patient Active Problem List   Diagnosis Date Noted   Arm heaviness 02/16/2020   Type 2 diabetes mellitus (HCC) 02/16/2020   Bilateral lower extremity edema 02/16/2020   Hypertensive emergency 02/15/2020    Past Surgical History:  Procedure Laterality Date   SHOULDER SURGERY         Family History  Problem Relation Age of Onset   Diabetes Other     Social History   Tobacco Use   Smoking status: Never   Smokeless tobacco: Never  Substance Use Topics   Alcohol use: Yes   Drug use: Yes    Types: Marijuana    Home Medications Prior to Admission medications   Medication Sig Start Date End Date Taking? Authorizing Provider  amLODipine (NORVASC) 10 MG tablet Take 1 tablet (10 mg total) by mouth daily. 02/19/20   Calvert Cantor, MD  chlorthalidone (HYGROTON) 25 MG tablet Take 25 mg by mouth daily. 08/19/20   [provider]  glipiZIDE (GLUCOTROL) 10 MG tablet Take  10 mg by mouth 2 (two) times daily. 11/07/20   [provider]  hydrALAZINE (APRESOLINE) 25 MG tablet Take 1 tablet (25 mg total) by mouth every 8 (eight) hours. Patient not taking: Reported on 12/10/2020 02/18/20   Calvert Cantor, MD  insulin aspart protamine- aspart (NOVOLOG MIX 70/30) (70-30) 100 UNIT/ML injection Inject 15 Units into the skin 3 (three) times daily as needed (high blood sugar).    [provider]  olmesartan (BENICAR) 40 MG tablet Take 20 mg by mouth daily. Patient not taking: Reported on 12/10/2020 08/19/20   [provider]    Allergies    Patient has no known allergies.  Review of Systems   Review of Systems  Constitutional:  Positive for fever.  HENT:  Positive for congestion.   Respiratory:  Positive for cough.   Gastrointestinal:  Positive for abdominal pain, diarrhea, nausea and vomiting.   Physical Exam Updated Vital Signs BP (!) 175/102 (BP Location: Right Arm)    Pulse 88    Temp 98.6 F (37 C) (Oral)    Resp 16    Ht 6' (1.829 m)    Wt 120.2 kg    SpO2 95%    BMI 35.94 kg/m   Physical Exam Vitals and nursing note reviewed. Exam conducted with a chaperone present.  Constitutional:      Appearance: Normal appearance.  HENT:     Head: Normocephalic.     Nose: Congestion present.     Mouth/Throat:     Pharynx: No posterior oropharyngeal erythema.  Eyes:     Extraocular Movements: Extraocular movements intact.     Pupils: Pupils are equal, round, and reactive to light.  Cardiovascular:     Rate and Rhythm: Normal rate and regular rhythm.  Pulmonary:     Effort: Pulmonary effort is normal.     Breath sounds: Normal breath sounds.     Comments: Lungs CTA bilaterally. No accessory muscle use. Speaking in complete sentences.  Abdominal:     General: Abdomen is flat.     Palpations: Abdomen is soft.     Tenderness: There is abdominal tenderness.     Comments: Periumbilical tenderness without any rigidity or guarding, no CVA  tenderness  Musculoskeletal:     Cervical back: Normal range of motion.  Neurological:     Mental Status: He is alert.  Psychiatric:        Mood and Affect: Mood normal.   ED Results / Procedures / Treatments   Labs (all labs ordered are listed, but only abnormal results are displayed) Labs Reviewed  RESP PANEL BY RT-PCR (FLU A&B, COVID) ARPGX2 - Abnormal; Notable for the following components:      Result Value   Influenza A by PCR POSITIVE (*)    All other components within normal limits    EKG None  Radiology No results found.  Procedures Procedures   Medications Ordered in ED Medications  ondansetron (ZOFRAN-ODT) disintegrating tablet 4 mg (4 mg Oral Given 02/18/21 1255)  amLODipine (NORVASC) tablet 10 mg (10 mg Oral Given 02/18/21 1254)  hydrALAZINE (APRESOLINE) tablet 25 mg (25 mg Oral Given 02/18/21 1254)  alum & mag hydroxide-simeth (MAALOX/MYLANTA) 200-200-20 MG/5ML suspension 30 mL (30 mLs Oral Given 02/18/21 1255)    And  lidocaine (XYLOCAINE) 2 % viscous mouth solution 15 mL (15 mLs Oral Given 02/18/21 1255)    ED Course  I have reviewed the triage vital signs and the nursing notes.  Pertinent labs & imaging results that were available during my care of the patient were reviewed by me and considered in my medical decision making (see chart for details).    MDM Rules/Calculators/A&P                         Patient is hypertensive, reports he has not been taking his hypertension medicine due to nausea.  Denies any chest pain, shortness of breath, vision changes.  Will give home dose here in the ED as well as some nausea medicine.  The symptoms seem primarily like a gastroenteritis, his daughter at home also has flu.  We will start with viral panel and symptom treatment and will reassess.  No peritoneal signs on abdominal exam so I do not have a high suspicion for acute abdomen.  Patient is positive for the flu, this ending is contributory for his symptoms.  Do  not think he needs additional work-up at this time, he is hypertensive but given home medicine here he is been off it at home likely worsening hypertension.  No signs hypertensive urgency, no symptoms.  We will hold off on labs and return precautions given.     Final Clinical Impression(s) / ED Diagnoses Final diagnoses:  Influenza A    Rx / DC Orders ED Discharge Orders     None  Theron Arista, PA-C 02/18/21 1631    Jacalyn Lefevre, MD 02/19/21 [phone]

## 2021-03-25 ENCOUNTER — Other Ambulatory Visit: Payer: Self-pay

## 2021-03-26 ENCOUNTER — Ambulatory Visit: Payer: Self-pay

## 2021-03-27 ENCOUNTER — Ambulatory Visit
Admission: RE | Admit: 2021-03-27 | Discharge: 2021-03-27 | Disposition: A | Discharge status: Home / Self Care | Payer: Self-pay | Source: Ambulatory Visit | Attending: Student | Admitting: Student

## 2021-03-27 ENCOUNTER — Other Ambulatory Visit: Payer: Self-pay

## 2021-03-27 VITALS — BP 193/121 | HR 79 | Temp 98.4°F | Resp 20

## 2021-03-27 DIAGNOSIS — Z0189 Encounter for other specified special examinations: Secondary | ICD-10-CM

## 2021-03-27 DIAGNOSIS — I160 Hypertensive urgency: Secondary | ICD-10-CM

## 2021-03-27 DIAGNOSIS — R600 Localized edema: Secondary | ICD-10-CM

## 2021-03-27 DIAGNOSIS — I10 Essential (primary) hypertension: Secondary | ICD-10-CM

## 2021-03-27 DIAGNOSIS — Z760 Encounter for issue of repeat prescription: Secondary | ICD-10-CM

## 2021-03-27 DIAGNOSIS — E1169 Type 2 diabetes mellitus with other specified complication: Secondary | ICD-10-CM

## 2021-03-27 MED ORDER — OLMESARTAN MEDOXOMIL 20 MG PO TABS: 20.0000 mg | ORAL_TABLET | Freq: Every day | ORAL | 0 refills | Status: DC

## 2021-03-27 MED ORDER — AMLODIPINE BESYLATE 10 MG PO TABS: 10.0000 mg | ORAL_TABLET | Freq: Every day | ORAL | 0 refills | Status: DC

## 2021-03-27 MED ORDER — CHLORTHALIDONE 25 MG PO TABS: 25.0000 mg | ORAL_TABLET | Freq: Every day | ORAL | 0 refills | Status: DC

## 2021-03-27 NOTE — ED Provider Notes (Signed)
[facility]    CSN: [id number] Arrival date & time: 03/27/21  1159      History   Chief Complaint Chief Complaint  Patient presents with   Medication Refill    Legs and feet are swollen and BP meds to be refilled   appointment    1200    HPI Mike Bowen is a 41 y.o. male presenting for medication refill.  He has been out of his antihypertensives for about 1.5 months.  Describes bilateral lower extremity edema, onset 1.5 months ago.  He has a documented history of this in the past.  States that the edema is at the worst at the end of the day, he does stand all day for work.  The swelling goes down after he sleeps at night.  Denies headaches, chest pain, dizziness, weakness.  Currently does not have a primary care doctor, but does establish care with a new one 04/18/2021  HPI  Past Medical History:  Diagnosis Date   Diabetes mellitus without complication ([place])    Hypertension     Patient Active Problem List   Diagnosis Date Noted   Arm heaviness 02/16/2020   Type 2 diabetes mellitus ([place]) 02/16/2020   Bilateral lower extremity edema 02/16/2020   Hypertensive emergency 02/15/2020    Past Surgical History:  Procedure Laterality Date   SHOULDER SURGERY         Home Medications    Prior to Admission medications   Medication Sig Start Date End Date Taking? Authorizing Provider  amLODipine (NORVASC) 10 MG tablet Take 1 tablet (10 mg total) by mouth daily. 03/27/21   Hazel Sams, PA-C  chlorthalidone (HYGROTON) 25 MG tablet Take 1 tablet (25 mg total) by mouth daily. 03/27/21   Hazel Sams, PA-C  glipiZIDE (GLUCOTROL) 10 MG tablet Take 10 mg by mouth 2 (two) times daily. 11/07/20   [provider]  hydrALAZINE (APRESOLINE) 25 MG tablet Take 1 tablet (25 mg total) by mouth every 8 (eight) hours. Patient not taking: Reported on 12/10/2020 02/18/20   Debbe Odea, MD  insulin aspart protamine- aspart (NOVOLOG MIX 70/30) (70-30) 100 UNIT/ML  injection Inject 15 Units into the skin 3 (three) times daily as needed (high blood sugar).    [provider]  olmesartan (BENICAR) 20 MG tablet Take 1 tablet (20 mg total) by mouth daily. 03/27/21 04/26/21  Hazel Sams, PA-C    Family History Family History  Problem Relation Age of Onset   High blood pressure Mother    Diabetes Father    Diabetes Other     Social History Social History   Tobacco Use   Smoking status: Never   Smokeless tobacco: Never  Vaping Use   Vaping Use: Never used  Substance Use Topics   Alcohol use: Yes    Comment: Occas   Drug use: Yes    Types: Marijuana     Allergies   Patient has no known allergies.   Review of Systems Review of Systems  Cardiovascular:  Negative for chest pain.  Neurological:  Negative for headaches.    Physical Exam Triage Vital Signs ED Triage Vitals  Enc Vitals Group     BP 03/27/21 1231 (!) 193/121     Pulse Rate 03/27/21 1231 79     Resp 03/27/21 1231 20     Temp 03/27/21 1231 98.4 F (36.9 C)     Temp Source 03/27/21 1231 Oral     SpO2 03/27/21 1231 98 %  Weight --      Height --      Head Circumference --      Peak Flow --      Pain Score 03/27/21 1228 0     Pain Loc --      Pain Edu? --      Excl. in [place]? --    No data found.  Updated Vital Signs BP (!) 193/121 (BP Location: Right Arm) Comment: No BP Meds for the past month   Pulse 79    Temp 98.4 F (36.9 C) (Oral)    Resp 20    SpO2 98%   Visual Acuity Right Eye Distance:   Left Eye Distance:   Bilateral Distance:    Right Eye Near:   Left Eye Near:    Bilateral Near:     Physical Exam Vitals reviewed.  Constitutional:      Appearance: Normal appearance. He is not diaphoretic.  HENT:     Head: Normocephalic and atraumatic.     Mouth/Throat:     Mouth: Mucous membranes are moist.  Eyes:     Extraocular Movements: Extraocular movements intact.     Pupils: Pupils are equal, round, and reactive to light.   Cardiovascular:     Rate and Rhythm: Normal rate and regular rhythm.     Pulses:          Radial pulses are 2+ on the right side and 2+ on the left side.     Heart sounds: Normal heart sounds.     Comments: 3+ pitting edema bilaterally extending to knees. No calf distension or tenderness.  Pulmonary:     Effort: Pulmonary effort is normal.     Breath sounds: Normal breath sounds.  Abdominal:     Palpations: Abdomen is soft.     Tenderness: There is no abdominal tenderness. There is no guarding or rebound.  Musculoskeletal:     Right lower leg: 3+ Edema present.     Left lower leg: 3+ Edema present.  Skin:    General: Skin is warm.     Capillary Refill: Capillary refill takes less than 2 seconds.  Neurological:     General: No focal deficit present.     Mental Status: He is alert and oriented to person, place, and time.     Comments: PERRLA, EOMI  Psychiatric:        Mood and Affect: Mood normal.        Behavior: Behavior normal.        Thought Content: Thought content normal.        Judgment: Judgment normal.     UC Treatments / Results  Labs (all labs ordered are listed, but only abnormal results are displayed) Labs Reviewed  BASIC METABOLIC PANEL    EKG   Radiology No results found.  Procedures Procedures (including critical care time)  Medications Ordered in UC Medications - No data to display  Initial Impression / Assessment and Plan / UC Course  I have reviewed the triage vital signs and the nursing notes.  Pertinent labs & imaging results that were available during my care of the patient were reviewed by me and considered in my medical decision making (see chart for details).     This patient is a very pleasant 41 y.o. year old male presenting with hypertension and lower extremity edema.   Blood pressure is 193/121, he has been out of his blood pressure medications for 1.5 months.  Denies associated shortness of  breath, chest pain, dizziness,  headaches, vision changes. He does have 3+ pitting edema bilaterally, onset 1.5 months ago.   Refilled BP medications as below. Confirmed dosage and medication names 3x with patient. Also rec compression socks and DASH diet. No routine labs x6 months. Initially planned to check a BNP and BMP; unfortunately we are only able to check a BMP at this clinic.  Diabetes is fairly well controlled, fasting sugars ranging 110-150s this week.   Establish care with PCP 2/20 as scheduled.  ED return precautions discussed. Patient verbalizes understanding and agreement.   Coding Level 4 for acute exacerbation of chronic condition, and prescription drug management   Final Clinical Impressions(s) / UC Diagnoses   Final diagnoses:  Essential hypertension  Medication refill  Routine lab draw  Lower extremity edema  Hypertensive urgency  Type 2 diabetes mellitus with other specified complication, with long-term current use of insulin [facility])     Discharge Instructions      -Restart the blood pressure medications -Follow-up with new PCP as scheduled 2/20 -Please check your blood pressure at home or at the pharmacy. If this continues to be >140/90, follow-up with your primary care provider for further blood pressure management/ medication titration. If you develop chest pain, shortness of breath, vision changes, the worst headache of your life- head straight to the ED or call 911. -Try purchasing compression socks to wear throughout the day     ED Prescriptions     Medication Sig Dispense Auth. Provider   amLODipine (NORVASC) 10 MG tablet Take 1 tablet (10 mg total) by mouth daily. 30 tablet Hazel Sams, PA-C   chlorthalidone (HYGROTON) 25 MG tablet Take 1 tablet (25 mg total) by mouth daily. 30 tablet Hazel Sams, PA-C   olmesartan (BENICAR) 20 MG tablet Take 1 tablet (20 mg total) by mouth daily. 30 tablet Hazel Sams, PA-C      PDMP not reviewed this encounter.   Hazel Sams, PA-C 03/27/21 1300

## 2021-03-27 NOTE — ED Triage Notes (Signed)
Patient states that he needs a refill on his BP meds  Patient states that for the past month both of his legs and both feet are staying swollen throughout the day.

## 2021-03-27 NOTE — Discharge Instructions (Addendum)
-  Restart the blood pressure medications -Follow-up with new PCP as scheduled 2/20 -Please check your blood pressure at home or at the pharmacy. If this continues to be >140/90, follow-up with your primary care provider for further blood pressure management/ medication titration. If you develop chest pain, shortness of breath, vision changes, the worst headache of your life- head straight to the ED or call 911. -Try purchasing compression socks to wear throughout the day

## 2021-03-28 LAB — BASIC METABOLIC PANEL
BUN/Creatinine Ratio: 13 (ref 9–20)
BUN: 18 mg/dL (ref 6–24)
CO2: 26 mmol/L (ref 20–29)
Calcium: 8.2 mg/dL — ABNORMAL LOW (ref 8.7–10.2)
Chloride: 104 mmol/L (ref 96–106)
Creatinine, Ser: 1.43 mg/dL — ABNORMAL HIGH (ref 0.76–1.27)
Glucose: 153 mg/dL — ABNORMAL HIGH (ref 70–99)
Potassium: 4.4 mmol/L (ref 3.5–5.2)
Sodium: 138 mmol/L (ref 134–144)
eGFR: 63 mL/min/{1.73_m2} (ref >59)

## 2021-04-18 ENCOUNTER — Other Ambulatory Visit: Payer: Self-pay

## 2021-04-18 ENCOUNTER — Ambulatory Visit (INDEPENDENT_AMBULATORY_CARE_PROVIDER_SITE_OTHER): Payer: BC Managed Care – PPO | Admitting: Internal Medicine

## 2021-04-18 ENCOUNTER — Encounter: Payer: Self-pay | Admitting: Internal Medicine

## 2021-04-18 VITALS — BP 144/98 | HR 86 | Resp 18 | Ht 72.0 in | Wt 276.1 lb

## 2021-04-18 DIAGNOSIS — R600 Localized edema: Secondary | ICD-10-CM

## 2021-04-18 DIAGNOSIS — B351 Tinea unguium: Secondary | ICD-10-CM

## 2021-04-18 DIAGNOSIS — I10 Essential (primary) hypertension: Secondary | ICD-10-CM

## 2021-04-18 DIAGNOSIS — E782 Mixed hyperlipidemia: Secondary | ICD-10-CM

## 2021-04-18 DIAGNOSIS — Z2821 Immunization not carried out because of patient refusal: Secondary | ICD-10-CM

## 2021-04-18 DIAGNOSIS — Z794 Long term (current) use of insulin: Secondary | ICD-10-CM

## 2021-04-18 DIAGNOSIS — J069 Acute upper respiratory infection, unspecified: Secondary | ICD-10-CM

## 2021-04-18 DIAGNOSIS — E1140 Type 2 diabetes mellitus with diabetic neuropathy, unspecified: Secondary | ICD-10-CM | POA: Diagnosis not present

## 2021-04-18 DIAGNOSIS — Z1159 Encounter for screening for other viral diseases: Secondary | ICD-10-CM

## 2021-04-18 MED ORDER — GLIPIZIDE 10 MG PO TABS: 10.0000 mg | ORAL_TABLET | Freq: Two times a day (BID) | ORAL | 1 refills | Status: DC

## 2021-04-18 MED ORDER — BENZONATATE 100 MG PO CAPS: 100.0000 mg | ORAL_CAPSULE | Freq: Two times a day (BID) | ORAL | 0 refills | Status: DC | PRN

## 2021-04-18 MED ORDER — OLMESARTAN MEDOXOMIL 20 MG PO TABS: 20.0000 mg | ORAL_TABLET | Freq: Every day | ORAL | 0 refills | Status: DC

## 2021-04-18 MED ORDER — TERBINAFINE HCL 250 MG PO TABS: 250.0000 mg | ORAL_TABLET | Freq: Every day | ORAL | 2 refills | Status: DC

## 2021-04-18 MED ORDER — CHLORTHALIDONE 25 MG PO TABS: 25.0000 mg | ORAL_TABLET | Freq: Every day | ORAL | 0 refills | Status: DC

## 2021-04-18 MED ORDER — AMLODIPINE BESYLATE 10 MG PO TABS: 10.0000 mg | ORAL_TABLET | Freq: Every day | ORAL | 0 refills | Status: DC

## 2021-04-18 NOTE — Patient Instructions (Addendum)
Please start taking medications as prescribed. ° °Please continue to follow low carb diet and perform moderate exercise/walking at least 150 mins/week. °

## 2021-04-19 ENCOUNTER — Telehealth: Payer: Self-pay

## 2021-04-19 ENCOUNTER — Encounter: Payer: Self-pay | Admitting: *Deleted

## 2021-04-19 NOTE — Telephone Encounter (Signed)
Please let pt know this letter is available in his mychart

## 2021-04-19 NOTE — Telephone Encounter (Signed)
Patient called said his supervisor needs more detail note saying he can return to work with no restrictions. Because of his blood pressure and feet swelling.

## 2021-04-19 NOTE — Telephone Encounter (Signed)
Please advise pt did not mention needing this yesterday only that he needed note saying he was here for appt

## 2021-04-20 NOTE — Telephone Encounter (Signed)
Called patient and mailbox is full ,sent mychart message

## 2021-04-21 DIAGNOSIS — B351 Tinea unguium: Secondary | ICD-10-CM | POA: Insufficient documentation

## 2021-04-21 NOTE — Assessment & Plan Note (Signed)
Chronic On chlorthalidone for HTN Advised to perform leg elevation and compression socks as tolerated Advised to follow low-salt diet

## 2021-04-21 NOTE — Assessment & Plan Note (Addendum)
Started terbinafine If persistent concern, will refer to podiatry 

## 2021-04-21 NOTE — Assessment & Plan Note (Signed)
Lab Results  Component Value Date   HGBA1C 8.6 (H) 02/16/2020   On Novolog 15 U TID and Glipizide 10 mg BID Referred to endocrinology -needs to be on basal insulin with ISS Advised to follow diabetic diet Not on statin, will add based on lipid profile, on ARB F/u CMP and lipid panel Diabetic foot exam: Today Diabetic eye exam: Advised to follow up with Ophthalmology for diabetic eye exam

## 2021-04-21 NOTE — Assessment & Plan Note (Signed)
BP Readings from Last 1 Encounters:  04/18/21 (!) 144/98   uncontrolled as he has run out of his Olmesartan and Chlorthalidone Refilled amlodipine, olmesartan and chlorthalidone Counseled for compliance with the medications Advised DASH diet and moderate exercise/walking, at least 150 mins/week

## 2021-04-21 NOTE — Progress Notes (Signed)
New Patient Office Visit  Subjective:  Patient ID: Mike Bowen, male    DOB: 04-Aug-1980  Age: 41 y.o. MRN: [id number]  CC:  Chief Complaint  Patient presents with   New Patient (Initial Visit)    New patient has been awhile since last pcp pt has had a cough for 1 week no fever or runny nose    HPI Mike Bowen is a 41 y.o. male with past medical history of HTN, type 2 DM, obesity and chronic LE swelling who presents for establishing care.  HTN: His BP was elevated in the office today upon multiple measurements.  Of note, he had run out of his antihypertensive medications recently.  He denies any headache, dizziness, chest pain, dyspnea or palpitations currently.  Type II DM with obesity: He is on glipizide and Novolog Mix 70/30 - 15 U TID currently, but he is noncompliant to insulin regimen currently.  He does not check his blood glucose regularly.  He denies any polyuria or polydipsia currently.  He is not on statin.  He complains of chronic bilateral LE swelling.  It is worse at the end of the day and improves with rest.  He denies any numbness or tingling of the feet.  He complains of cough for last 1 week, but denies any fever, chills, nasal congestion or postnasal drip currently.  He does report having recent URTI.  Denies any dyspnea or wheezing currently.   Past Medical History:  Diagnosis Date   Diabetes mellitus without complication ([place])    Hypertension     Past Surgical History:  Procedure Laterality Date   SHOULDER SURGERY      Family History  Problem Relation Age of Onset   High blood pressure Mother    Diabetes Father    Diabetes Other     Social History   Socioeconomic History   Marital status: Single    Spouse name: Not on file   Number of children: Not on file   Years of education: Not on file   Highest education level: Not on file  Occupational History   Not on file  Tobacco Use   Smoking status: Never   Smokeless tobacco: Never  Vaping  Use   Vaping Use: Never used  Substance and Sexual Activity   Alcohol use: Yes    Comment: Occas   Drug use: Yes    Types: Marijuana   Sexual activity: Yes    Birth control/protection: None  Other Topics Concern   Not on file  Social History Narrative   Not on file   Social Determinants of Health   Financial Resource Strain: Not on file  Food Insecurity: Not on file  Transportation Needs: Not on file  Physical Activity: Not on file  Stress: Not on file  Social Connections: Not on file  Intimate Partner Violence: Not on file    ROS Review of Systems  Constitutional:  Negative for chills and fever.  HENT:  Negative for congestion and sore throat.   Eyes:  Negative for pain and discharge.  Respiratory:  Positive for cough. Negative for shortness of breath.   Cardiovascular:  Positive for leg swelling. Negative for chest pain and palpitations.  Gastrointestinal:  Negative for diarrhea, nausea and vomiting.  Endocrine: Negative for polydipsia and polyuria.  Genitourinary:  Negative for dysuria and hematuria.  Musculoskeletal:  Negative for neck pain and neck stiffness.  Skin:  Negative for rash.  Neurological:  Negative for dizziness, weakness, numbness and  headaches.  Psychiatric/Behavioral:  Negative for agitation and behavioral problems.    Objective:   Today's Vitals: BP (!) 144/98 (BP Location: Right Arm, Cuff Size: Normal)    Pulse 86    Resp 18    Ht 6' (1.829 m)    Wt 276 lb 1.9 oz (125.2 kg)    SpO2 97%    BMI 37.45 kg/m   Physical Exam Vitals reviewed.  Constitutional:      General: He is not in acute distress.    Appearance: He is not diaphoretic.  HENT:     Head: Normocephalic and atraumatic.     Nose: Nose normal.     Mouth/Throat:     Mouth: Mucous membranes are moist.  Eyes:     General: No scleral icterus.    Extraocular Movements: Extraocular movements intact.  Cardiovascular:     Rate and Rhythm: Normal rate and regular rhythm.     Pulses:  Normal pulses.     Heart sounds: Normal heart sounds. No murmur heard. Pulmonary:     Breath sounds: Normal breath sounds. No wheezing or rales.  Musculoskeletal:     Cervical back: Neck supple. No tenderness.     Right lower leg: No edema.     Left lower leg: No edema.  Feet:     Right foot:     Toenail Condition: Right toenails are abnormally thick and long. Fungal disease present.    Left foot:     Toenail Condition: Left toenails are abnormally thick and long. Fungal disease present. Skin:    General: Skin is warm.     Findings: No rash.  Neurological:     General: No focal deficit present.     Mental Status: He is alert and oriented to person, place, and time.     Sensory: No sensory deficit.     Motor: No weakness.  Psychiatric:        Mood and Affect: Mood normal.        Behavior: Behavior normal.    Assessment & Plan:   Problem List Items Addressed This Visit       Cardiovascular and Mediastinum   Essential hypertension    BP Readings from Last 1 Encounters:  04/18/21 (!) 144/98  uncontrolled as he has run out of his Olmesartan and Chlorthalidone Refilled amlodipine, olmesartan and chlorthalidone Counseled for compliance with the medications Advised DASH diet and moderate exercise/walking, at least 150 mins/week       Relevant Medications   amLODipine (NORVASC) 10 MG tablet   chlorthalidone (HYGROTON) 25 MG tablet   olmesartan (BENICAR) 20 MG tablet     Respiratory   URTI (acute upper respiratory infection)    Likely viral Advised to symptomatic treatment with Mucinex or Robitussin for now Tessalon as needed for cough Advised to contact if persistent symptoms      Relevant Medications   benzonatate (TESSALON) 100 MG capsule   terbinafine (LAMISIL) 250 MG tablet     Endocrine   Type 2 diabetes mellitus with diabetic neuropathy, with long-term current use of insulin ([facility]CC) - Primary    Lab Results  Component Value Date   HGBA1C 8.6 (H) 02/16/2020   On Novolog 15 U TID and Glipizide 10 mg BID Referred to endocrinology -needs to be on basal insulin with ISS Advised to follow diabetic diet Not on statin, will add based on lipid profile, on ARB F/u CMP and lipid panel Diabetic foot exam: Today Diabetic eye exam: Advised to follow  up with Ophthalmology for diabetic eye exam      Relevant Medications   olmesartan (BENICAR) 20 MG tablet   glipiZIDE (GLUCOTROL) 10 MG tablet   Other Relevant Orders   Ambulatory referral to Endocrinology   CMP14+EGFR   Hemoglobin A1c     Musculoskeletal and Integument   Onychomycosis    Started terbinafine If persistent concern, will refer to podiatry      Relevant Medications   terbinafine (LAMISIL) 250 MG tablet     Other   Bilateral lower extremity edema    Chronic On chlorthalidone for HTN Advised to perform leg elevation and compression socks as tolerated Advised to follow low-salt diet      Other Visit Diagnoses     Mixed hyperlipidemia       Relevant Medications   amLODipine (NORVASC) 10 MG tablet   chlorthalidone (HYGROTON) 25 MG tablet   olmesartan (BENICAR) 20 MG tablet   Other Relevant Orders   Lipid Profile   Need for hepatitis C screening test       Relevant Orders   Hepatitis C Antibody   Refused influenza vaccine           Outpatient Encounter Medications as of 04/18/2021  Medication Sig   benzonatate (TESSALON) 100 MG capsule Take 1 capsule (100 mg total) by mouth 2 (two) times daily as needed for cough.   insulin aspart protamine- aspart (NOVOLOG MIX 70/30) (70-30) 100 UNIT/ML injection Inject 15 Units into the skin 3 (three) times daily as needed (high blood sugar).   terbinafine (LAMISIL) 250 MG tablet Take 1 tablet (250 mg total) by mouth daily.   [DISCONTINUED] amLODipine (NORVASC) 10 MG tablet Take 1 tablet (10 mg total) by mouth daily.   [DISCONTINUED] chlorthalidone (HYGROTON) 25 MG tablet Take 1 tablet (25 mg total) by mouth daily.   [DISCONTINUED]  olmesartan (BENICAR) 20 MG tablet Take 1 tablet (20 mg total) by mouth daily.   amLODipine (NORVASC) 10 MG tablet Take 1 tablet (10 mg total) by mouth daily.   chlorthalidone (HYGROTON) 25 MG tablet Take 1 tablet (25 mg total) by mouth daily.   glipiZIDE (GLUCOTROL) 10 MG tablet Take 1 tablet (10 mg total) by mouth 2 (two) times daily.   olmesartan (BENICAR) 20 MG tablet Take 1 tablet (20 mg total) by mouth daily.   [DISCONTINUED] glipiZIDE (GLUCOTROL) 10 MG tablet Take 10 mg by mouth 2 (two) times daily. (Patient not taking: Reported on 04/18/2021)   [DISCONTINUED] hydrALAZINE (APRESOLINE) 25 MG tablet Take 1 tablet (25 mg total) by mouth every 8 (eight) hours. (Patient not taking: Reported on 04/18/2021)   No facility-administered encounter medications on file as of 04/18/2021.    Follow-up: Return in about 3 weeks (around 05/09/2021) for HTN.   Lindell Spar, MD

## 2021-04-21 NOTE — Assessment & Plan Note (Signed)
Likely viral Advised to symptomatic treatment with Mucinex or Robitussin for now Tessalon as needed for cough Advised to contact if persistent symptoms

## 2021-04-22 ENCOUNTER — Other Ambulatory Visit: Payer: Self-pay | Admitting: Internal Medicine

## 2021-04-22 DIAGNOSIS — I10 Essential (primary) hypertension: Secondary | ICD-10-CM

## 2021-04-27 ENCOUNTER — Telehealth: Payer: Self-pay | Admitting: Internal Medicine

## 2021-04-27 ENCOUNTER — Other Ambulatory Visit: Payer: Self-pay | Admitting: *Deleted

## 2021-04-27 DIAGNOSIS — B351 Tinea unguium: Secondary | ICD-10-CM

## 2021-04-27 DIAGNOSIS — E1140 Type 2 diabetes mellitus with diabetic neuropathy, unspecified: Secondary | ICD-10-CM

## 2021-04-27 DIAGNOSIS — Z794 Long term (current) use of insulin: Secondary | ICD-10-CM

## 2021-04-27 DIAGNOSIS — I10 Essential (primary) hypertension: Secondary | ICD-10-CM

## 2021-04-27 MED ORDER — CHLORTHALIDONE 25 MG PO TABS
25.0000 mg | ORAL_TABLET | Freq: Every day | ORAL | 0 refills | Status: DC
Start: 2021-04-27 — End: 2021-05-23

## 2021-04-27 MED ORDER — GLIPIZIDE 10 MG PO TABS: 10.0000 mg | ORAL_TABLET | Freq: Two times a day (BID) | ORAL | 1 refills | Status: DC

## 2021-04-27 MED ORDER — TERBINAFINE HCL 250 MG PO TABS: 250.0000 mg | ORAL_TABLET | Freq: Every day | ORAL | 2 refills | Status: DC

## 2021-04-27 NOTE — Telephone Encounter (Signed)
Patient called in regard to refills  ? ?Patient accidentally threw medicines away during cleaning . ? ?Pt is requesting refill on  ? ?terbinafine (LAMISIL) 250 MG tablet ? ?chlorthalidone (HYGROTON) 25 MG tablet  ? ?glipiZIDE (GLUCOTROL) 10 MG tablet  ? ? ?[facility] in [place] ? ?Can call patient back in regard. ?

## 2021-04-27 NOTE — Telephone Encounter (Signed)
Medication sent to pharmacy  

## 2021-05-16 ENCOUNTER — Ambulatory Visit: Payer: BC Managed Care – PPO | Admitting: Internal Medicine

## 2021-05-20 DIAGNOSIS — Z1159 Encounter for screening for other viral diseases: Secondary | ICD-10-CM | POA: Diagnosis not present

## 2021-05-20 DIAGNOSIS — Z794 Long term (current) use of insulin: Secondary | ICD-10-CM | POA: Diagnosis not present

## 2021-05-20 DIAGNOSIS — E1140 Type 2 diabetes mellitus with diabetic neuropathy, unspecified: Secondary | ICD-10-CM | POA: Diagnosis not present

## 2021-05-20 DIAGNOSIS — E782 Mixed hyperlipidemia: Secondary | ICD-10-CM | POA: Diagnosis not present

## 2021-05-21 LAB — CMP14+EGFR
ALT: 26 IU/L (ref 0–44)
AST: 35 IU/L (ref 0–40)
Albumin/Globulin Ratio: 1.1 — ABNORMAL LOW (ref 1.2–2.2)
Albumin: 2.6 g/dL — ABNORMAL LOW (ref 4.0–5.0)
Alkaline Phosphatase: 83 IU/L (ref 44–121)
BUN/Creatinine Ratio: 14 (ref 9–20)
BUN: 22 mg/dL (ref 6–24)
Bilirubin Total: <0.2 mg/dL (ref 0.0–1.2)
CO2: 24 mmol/L (ref 20–29)
Calcium: 8.3 mg/dL — ABNORMAL LOW (ref 8.7–10.2)
Chloride: 107 mmol/L — ABNORMAL HIGH (ref 96–106)
Creatinine, Ser: 1.57 mg/dL — ABNORMAL HIGH (ref 0.76–1.27)
Globulin, Total: 2.4 g/dL (ref 1.5–4.5)
Glucose: 115 mg/dL — ABNORMAL HIGH (ref 70–99)
Potassium: 4.2 mmol/L (ref 3.5–5.2)
Sodium: 141 mmol/L (ref 134–144)
Total Protein: 5 g/dL — ABNORMAL LOW (ref 6.0–8.5)
eGFR: 56 mL/min/{1.73_m2} — ABNORMAL LOW (ref >59)

## 2021-05-21 LAB — LIPID PANEL
Chol/HDL Ratio: 5.4 ratio — ABNORMAL HIGH (ref 0.0–5.0)
Cholesterol, Total: 272 mg/dL — ABNORMAL HIGH (ref 100–199)
HDL: 50 mg/dL (ref >39)
LDL Chol Calc (NIH): 204 mg/dL — ABNORMAL HIGH (ref 0–99)
Triglycerides: 102 mg/dL (ref 0–149)
VLDL Cholesterol Cal: 18 mg/dL (ref 5–40)

## 2021-05-21 LAB — HEMOGLOBIN A1C
Est. average glucose Bld gHb Est-mCnc: 171 mg/dL
Hgb A1c MFr Bld: 7.6 % — ABNORMAL HIGH (ref 4.8–5.6)

## 2021-05-21 LAB — HEPATITIS C ANTIBODY: Hep C Virus Ab: NONREACTIVE

## 2021-05-23 ENCOUNTER — Ambulatory Visit (INDEPENDENT_AMBULATORY_CARE_PROVIDER_SITE_OTHER): Payer: BC Managed Care – PPO | Admitting: Internal Medicine

## 2021-05-23 ENCOUNTER — Encounter: Payer: Self-pay | Admitting: Internal Medicine

## 2021-05-23 ENCOUNTER — Other Ambulatory Visit: Payer: Self-pay

## 2021-05-23 ENCOUNTER — Other Ambulatory Visit: Payer: Self-pay | Admitting: Internal Medicine

## 2021-05-23 VITALS — BP 148/82 | HR 89 | Ht 72.0 in | Wt 271.6 lb

## 2021-05-23 DIAGNOSIS — E1140 Type 2 diabetes mellitus with diabetic neuropathy, unspecified: Secondary | ICD-10-CM

## 2021-05-23 DIAGNOSIS — K219 Gastro-esophageal reflux disease without esophagitis: Secondary | ICD-10-CM | POA: Diagnosis not present

## 2021-05-23 DIAGNOSIS — R600 Localized edema: Secondary | ICD-10-CM

## 2021-05-23 DIAGNOSIS — I10 Essential (primary) hypertension: Secondary | ICD-10-CM

## 2021-05-23 DIAGNOSIS — E782 Mixed hyperlipidemia: Secondary | ICD-10-CM

## 2021-05-23 DIAGNOSIS — Z794 Long term (current) use of insulin: Secondary | ICD-10-CM

## 2021-05-23 MED ORDER — TIRZEPATIDE 2.5 MG/0.5ML ~~LOC~~ SOAJ: 2.5000 mg | SUBCUTANEOUS | 0 refills | Status: DC

## 2021-05-23 MED ORDER — AMLODIPINE-OLMESARTAN 10-40 MG PO TABS: 1.0000 | ORAL_TABLET | Freq: Every day | ORAL | 5 refills | Status: DC

## 2021-05-23 MED ORDER — CHLORTHALIDONE 25 MG PO TABS: 25.0000 mg | ORAL_TABLET | Freq: Every day | ORAL | 5 refills | Status: DC

## 2021-05-23 MED ORDER — ROSUVASTATIN CALCIUM 10 MG PO TABS: 10.0000 mg | ORAL_TABLET | Freq: Every day | ORAL | 1 refills | Status: DC

## 2021-05-23 MED ORDER — MISC. DEVICES MISC: 0 refills | Status: DC

## 2021-05-23 MED ORDER — OMEPRAZOLE 20 MG PO CPDR: 20.0000 mg | DELAYED_RELEASE_CAPSULE | Freq: Every day | ORAL | 5 refills | Status: DC

## 2021-05-23 NOTE — Assessment & Plan Note (Signed)
BP Readings from Last 1 Encounters:  ?05/23/21 (!) 148/82  ? ?uncontrolled as he has run out of his Olmesartan and Chlorthalidone ?Increased dose of olmesartan to 40 mg in the combination form with amlodipine 10 mg daily ?Refilled chlorthalidone ?Counseled for compliance with the medications ?Advised DASH diet and moderate exercise/walking, at least 150 mins/week ?

## 2021-05-23 NOTE — Assessment & Plan Note (Signed)
Lab Results  ?Component Value Date  ? HGBA1C 7.6 (H) 05/20/2021  ? ?On Glipizide 10 mg BID ?Added Mounjaro for  better glycemic profile and additional weight loss benefit ?Advised to follow diabetic diet ?Added statin, on ARB ?Reviewed CMP and lipid panel ?Diabetic eye exam: Advised to follow up with Ophthalmology for diabetic eye exam ?

## 2021-05-23 NOTE — Patient Instructions (Signed)
Please start taking Olmesartan-Amlodipine 40-10 mg instead of separate medications. ? ?Please start taking Mounjaro as prescribed. ? ?Please start taking Rosuvastatin for cholesterol. ? ?Please continue to follow low carb diet and perform moderate exercise/walking at least 150 mins/week. ?

## 2021-05-23 NOTE — Assessment & Plan Note (Signed)
Lipid profile reviewed Started Crestor 

## 2021-05-23 NOTE — Assessment & Plan Note (Signed)
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

## 2021-05-23 NOTE — Progress Notes (Signed)
? ?Established Patient Office Visit ? ?Subjective:  ?Patient ID: Mike Bowen, male    DOB: [birth year]  Age: 41 y.o. MRN: [id number] ? ?CC:  ?Chief Complaint  ?Patient presents with  ? Follow-up  ?  Pt complains indigestion.   ? ? ?HPI ?Mike Bowen is a 41 y.o. male with past medical history of HTN, type 2 DM, obesity and chronic LE swelling who presents for f/u of her chronic medical conditions. ? ?HTN: His BP was elevated in the office today upon multiple measurements.  He also reports mild headache since this morning.  He has not had his olmesartan and amlodipine today.  He denies any dizziness, chest pain, dyspnea or palpitations currently. ? ?Type II DM with obesity: He is on glipizide.  He has not been taking his NovoLog Mix as his blood glucose has been ranging around 110-150.  He states that he has changed his diet since the last visit and has been trying to follow low-carb diet now. He denies any polyuria or polydipsia currently.  He is not on statin. ? ?He complains of chronic bilateral LE swelling.  It is worse at the end of the day and improves with rest.  He denies any numbness or tingling of the feet. ? ?He complains of intermittent indigestion and acid reflux, for which she has tried taking Alka-Seltzer PM.  He denies any vomiting, melena or hematochezia.  She states that he has BM every other day, but denies any straining for it. ? ? ? ?Past Medical History:  ?Diagnosis Date  ? Diabetes mellitus without complication ([place])   ? Hypertension   ? ? ?Past Surgical History:  ?Procedure Laterality Date  ? SHOULDER SURGERY    ? ? ?Family History  ?Problem Relation Age of Onset  ? High blood pressure Mother   ? Diabetes Father   ? Diabetes Other   ? ? ?Social History  ? ?Socioeconomic History  ? Marital status: Single  ?  Spouse name: Not on file  ? Number of children: Not on file  ? Years of education: Not on file  ? Highest education level: Not on file  ?Occupational History  ? Not on file  ?Tobacco Use   ? Smoking status: Never  ? Smokeless tobacco: Never  ?Vaping Use  ? Vaping Use: Never used  ?Substance and Sexual Activity  ? Alcohol use: Yes  ?  Comment: Occas  ? Drug use: Yes  ?  Types: Marijuana  ? Sexual activity: Yes  ?  Birth control/protection: None  ?Other Topics Concern  ? Not on file  ?Social History Narrative  ? Not on file  ? ?Social Determinants of Health  ? ?Financial Resource Strain: Not on file  ?Food Insecurity: Not on file  ?Transportation Needs: Not on file  ?Physical Activity: Not on file  ?Stress: Not on file  ?Social Connections: Not on file  ?Intimate Partner Violence: Not on file  ? ? ?Outpatient Medications Prior to Visit  ?Medication Sig Dispense Refill  ? glipiZIDE (GLUCOTROL) 10 MG tablet Take 1 tablet (10 mg total) by mouth 2 (two) times daily. 180 tablet 1  ? terbinafine (LAMISIL) 250 MG tablet Take 1 tablet (250 mg total) by mouth daily. 30 tablet 2  ? amLODipine (NORVASC) 10 MG tablet Take 1 tablet by mouth once daily 30 tablet 0  ? chlorthalidone (HYGROTON) 25 MG tablet Take 1 tablet (25 mg total) by mouth daily. 30 tablet 0  ? insulin aspart protamine- aspart (  NOVOLOG MIX 70/30) (70-30) 100 UNIT/ML injection Inject 15 Units into the skin 3 (three) times daily as needed (high blood sugar).    ? olmesartan (BENICAR) 20 MG tablet Take 1 tablet by mouth once daily 30 tablet 0  ? benzonatate (TESSALON) 100 MG capsule Take 1 capsule (100 mg total) by mouth 2 (two) times daily as needed for cough. (Patient not taking: Reported on 05/23/2021) 20 capsule 0  ? ?No facility-administered medications prior to visit.  ? ? ?No Known Allergies ? ?ROS ?Review of Systems  ?Constitutional:  Negative for chills and fever.  ?HENT:  Negative for congestion and sore throat.   ?Eyes:  Negative for pain and discharge.  ?Respiratory:  Negative for cough and shortness of breath.   ?Cardiovascular:  Positive for leg swelling. Negative for chest pain and palpitations.  ?Gastrointestinal:  Negative for  diarrhea, nausea and vomiting.  ?Endocrine: Negative for polydipsia and polyuria.  ?Genitourinary:  Negative for dysuria and hematuria.  ?Musculoskeletal:  Negative for neck pain and neck stiffness.  ?Skin:  Positive for rash.  ?Neurological:  Negative for dizziness, weakness, numbness and headaches.  ?Psychiatric/Behavioral:  Negative for agitation and behavioral problems.   ? ?  ?Objective:  ?  ?Physical Exam ?Vitals reviewed.  ?Constitutional:   ?   General: He is not in acute distress. ?   Appearance: He is not diaphoretic.  ?HENT:  ?   Head: Normocephalic and atraumatic.  ?   Nose: Nose normal.  ?   Mouth/Throat:  ?   Mouth: Mucous membranes are moist.  ?Eyes:  ?   General: No scleral icterus. ?   Extraocular Movements: Extraocular movements intact.  ?Cardiovascular:  ?   Rate and Rhythm: Normal rate and regular rhythm.  ?   Pulses: Normal pulses.  ?   Heart sounds: Normal heart sounds. No murmur heard. ?Pulmonary:  ?   Breath sounds: Normal breath sounds. No wheezing or rales.  ?Musculoskeletal:  ?   Cervical back: Neck supple. No tenderness.  ?   Right lower leg: Edema (2+) present.  ?   Left lower leg: Edema (2+) present.  ?Skin: ?   General: Skin is warm.  ?   Findings: Rash (Stasis dermatitis bilaterally) present.  ?Neurological:  ?   General: No focal deficit present.  ?   Mental Status: He is alert and oriented to person, place, and time.  ?   Sensory: No sensory deficit.  ?   Motor: No weakness.  ?Psychiatric:     ?   Mood and Affect: Mood normal.     ?   Behavior: Behavior normal.  ? ? ?BP (!) 148/82 (BP Location: Right Arm)   Pulse 89   Ht 6' (1.829 m)   Wt 271 lb 9.6 oz (123.2 kg)   SpO2 98%   BMI 36.84 kg/m?  ?Wt Readings from Last 3 Encounters:  ?05/23/21 271 lb 9.6 oz (123.2 kg)  ?04/18/21 276 lb 1.9 oz (125.2 kg)  ?02/18/21 265 lb (120.2 kg)  ? ? ?No results found for: TSH ?Lab Results  ?Component Value Date  ? WBC 6.7 12/09/2020  ? HGB 11.2 (L) 12/09/2020  ? HCT 33.5 (L) 12/09/2020  ? MCV  80.9 12/09/2020  ? PLT 150 12/09/2020  ? ?Lab Results  ?Component Value Date  ? NA 141 05/20/2021  ? K 4.2 05/20/2021  ? CO2 24 05/20/2021  ? GLUCOSE 115 (H) 05/20/2021  ? BUN 22 05/20/2021  ? CREATININE 1.57 (H) 05/20/2021  ?  BILITOT <0.2 05/20/2021  ? ALKPHOS 83 05/20/2021  ? AST 35 05/20/2021  ? ALT 26 05/20/2021  ? PROT 5.0 (L) 05/20/2021  ? ALBUMIN 2.6 (L) 05/20/2021  ? CALCIUM 8.3 (L) 05/20/2021  ? ANIONGAP 7 12/09/2020  ? EGFR 56 (L) 05/20/2021  ? ?Lab Results  ?Component Value Date  ? CHOL 272 (H) 05/20/2021  ? ?Lab Results  ?Component Value Date  ? HDL 50 05/20/2021  ? ?Lab Results  ?Component Value Date  ? LDLCALC 204 (H) 05/20/2021  ? ?Lab Results  ?Component Value Date  ? TRIG 102 05/20/2021  ? ?Lab Results  ?Component Value Date  ? CHOLHDL 5.4 (H) 05/20/2021  ? ?Lab Results  ?Component Value Date  ? HGBA1C 7.6 (H) 05/20/2021  ? ? ?  ?Assessment & Plan:  ? ?Problem List Items Addressed This Visit   ? ?  ? Cardiovascular and Mediastinum  ? Essential hypertension - Primary  ?  BP Readings from Last 1 Encounters:  ?05/23/21 (!) 148/82  ?uncontrolled as he has run out of his Olmesartan and Chlorthalidone ?Increased dose of olmesartan to 40 mg in the combination form with amlodipine 10 mg daily ?Refilled chlorthalidone ?Counseled for compliance with the medications ?Advised DASH diet and moderate exercise/walking, at least 150 mins/week ?  ?  ? Relevant Medications  ? amLODipine-olmesartan (AZOR) 10-40 MG tablet  ? chlorthalidone (HYGROTON) 25 MG tablet  ? rosuvastatin (CRESTOR) 10 MG tablet  ?  ? Endocrine  ? Type 2 diabetes mellitus with diabetic neuropathy, with long-term current use of insulin ([place])  ?  Lab Results  ?Component Value Date  ? HGBA1C 7.6 (H) 05/20/2021  ?On Glipizide 10 mg BID ?Added Mounjaro for  better glycemic profile and additional weight loss benefit ?Advised to follow diabetic diet ?Added statin, on ARB ?Reviewed CMP and lipid panel ?Diabetic eye exam: Advised to follow up with  Ophthalmology for diabetic eye exam ?  ?  ? Relevant Medications  ? amLODipine-olmesartan (AZOR) 10-40 MG tablet  ? tirzepatide (MOUNJARO) 2.5 MG/0.5ML Pen  ? rosuvastatin (CRESTOR) 10 MG tablet  ?  ? Other  ? Bilateral

## 2021-05-26 ENCOUNTER — Ambulatory Visit: Payer: Self-pay | Admitting: Nurse Practitioner

## 2021-05-31 ENCOUNTER — Other Ambulatory Visit: Payer: Self-pay | Admitting: Internal Medicine

## 2021-05-31 ENCOUNTER — Telehealth: Payer: Self-pay | Admitting: Internal Medicine

## 2021-05-31 DIAGNOSIS — E1140 Type 2 diabetes mellitus with diabetic neuropathy, unspecified: Secondary | ICD-10-CM

## 2021-05-31 MED ORDER — OZEMPIC (0.25 OR 0.5 MG/DOSE) 2 MG/1.5ML ~~LOC~~ SOPN: 0.5000 mg | PEN_INJECTOR | SUBCUTANEOUS | 0 refills | Status: DC

## 2021-05-31 NOTE — Telephone Encounter (Signed)
Mounjaro not approved. PA states he has to try and fail 3 of 4 covered drugs which are: ozempic, rybelsus, trulicity or victoza ?

## 2021-06-17 ENCOUNTER — Other Ambulatory Visit: Payer: Self-pay | Admitting: Internal Medicine

## 2021-06-17 ENCOUNTER — Telehealth: Payer: Self-pay

## 2021-06-17 DIAGNOSIS — E1140 Type 2 diabetes mellitus with diabetic neuropathy, unspecified: Secondary | ICD-10-CM

## 2021-06-17 MED ORDER — SEMAGLUTIDE (1 MG/DOSE) 4 MG/3ML ~~LOC~~ SOPN: 1.0000 mg | PEN_INJECTOR | SUBCUTANEOUS | 0 refills | Status: DC

## 2021-06-17 NOTE — Telephone Encounter (Signed)
Patient called said needs a higher dosage on the injection.  ? ?Semaglutide,0.25 or 0.5MG /DOS, (OZEMPIC, 0.25 OR 0.5 MG/DOSE,) 2 MG/1.5ML SOPN ? ?Pharmacy:  [facility] ?

## 2021-06-20 DIAGNOSIS — Z794 Long term (current) use of insulin: Secondary | ICD-10-CM | POA: Diagnosis not present

## 2021-06-20 DIAGNOSIS — E113533 Type 2 diabetes mellitus with proliferative diabetic retinopathy with traction retinal detachment not involving the macula, bilateral: Secondary | ICD-10-CM | POA: Diagnosis not present

## 2021-06-20 DIAGNOSIS — H4313 Vitreous hemorrhage, bilateral: Secondary | ICD-10-CM | POA: Diagnosis not present

## 2021-06-20 DIAGNOSIS — H35371 Puckering of macula, right eye: Secondary | ICD-10-CM | POA: Diagnosis not present

## 2021-06-20 LAB — HM DIABETES EYE EXAM

## 2021-06-21 DIAGNOSIS — E113533 Type 2 diabetes mellitus with proliferative diabetic retinopathy with traction retinal detachment not involving the macula, bilateral: Secondary | ICD-10-CM | POA: Diagnosis not present

## 2021-06-21 DIAGNOSIS — Z794 Long term (current) use of insulin: Secondary | ICD-10-CM | POA: Diagnosis not present

## 2021-07-04 DIAGNOSIS — E113592 Type 2 diabetes mellitus with proliferative diabetic retinopathy without macular edema, left eye: Secondary | ICD-10-CM | POA: Diagnosis not present

## 2021-07-04 DIAGNOSIS — E113532 Type 2 diabetes mellitus with proliferative diabetic retinopathy with traction retinal detachment not involving the macula, left eye: Secondary | ICD-10-CM | POA: Diagnosis not present

## 2021-07-04 DIAGNOSIS — Z7984 Long term (current) use of oral hypoglycemic drugs: Secondary | ICD-10-CM | POA: Diagnosis not present

## 2021-07-04 DIAGNOSIS — Z794 Long term (current) use of insulin: Secondary | ICD-10-CM | POA: Diagnosis not present

## 2021-07-04 DIAGNOSIS — H3342 Traction detachment of retina, left eye: Secondary | ICD-10-CM | POA: Diagnosis not present

## 2021-07-04 DIAGNOSIS — E669 Obesity, unspecified: Secondary | ICD-10-CM | POA: Diagnosis not present

## 2021-07-04 DIAGNOSIS — I10 Essential (primary) hypertension: Secondary | ICD-10-CM | POA: Diagnosis not present

## 2021-07-04 DIAGNOSIS — Z79899 Other long term (current) drug therapy: Secondary | ICD-10-CM | POA: Diagnosis not present

## 2021-07-04 DIAGNOSIS — H4312 Vitreous hemorrhage, left eye: Secondary | ICD-10-CM | POA: Diagnosis not present

## 2021-07-04 DIAGNOSIS — Z28310 Unvaccinated for covid-19: Secondary | ICD-10-CM | POA: Diagnosis not present

## 2021-07-12 DIAGNOSIS — E113533 Type 2 diabetes mellitus with proliferative diabetic retinopathy with traction retinal detachment not involving the macula, bilateral: Secondary | ICD-10-CM | POA: Diagnosis not present

## 2021-07-12 DIAGNOSIS — Z794 Long term (current) use of insulin: Secondary | ICD-10-CM | POA: Diagnosis not present

## 2021-07-28 DIAGNOSIS — E113533 Type 2 diabetes mellitus with proliferative diabetic retinopathy with traction retinal detachment not involving the macula, bilateral: Secondary | ICD-10-CM | POA: Diagnosis not present

## 2021-07-28 DIAGNOSIS — Z794 Long term (current) use of insulin: Secondary | ICD-10-CM | POA: Diagnosis not present

## 2021-08-01 DIAGNOSIS — H3341 Traction detachment of retina, right eye: Secondary | ICD-10-CM | POA: Diagnosis not present

## 2021-08-01 DIAGNOSIS — Z6833 Body mass index (BMI) 33.0-33.9, adult: Secondary | ICD-10-CM | POA: Diagnosis not present

## 2021-08-01 DIAGNOSIS — E113521 Type 2 diabetes mellitus with proliferative diabetic retinopathy with traction retinal detachment involving the macula, right eye: Secondary | ICD-10-CM | POA: Diagnosis not present

## 2021-08-01 DIAGNOSIS — E668 Other obesity: Secondary | ICD-10-CM | POA: Diagnosis not present

## 2021-08-01 DIAGNOSIS — H33021 Retinal detachment with multiple breaks, right eye: Secondary | ICD-10-CM | POA: Diagnosis not present

## 2021-08-01 DIAGNOSIS — Z7984 Long term (current) use of oral hypoglycemic drugs: Secondary | ICD-10-CM | POA: Diagnosis not present

## 2021-08-05 DIAGNOSIS — E113533 Type 2 diabetes mellitus with proliferative diabetic retinopathy with traction retinal detachment not involving the macula, bilateral: Secondary | ICD-10-CM | POA: Diagnosis not present

## 2021-08-05 DIAGNOSIS — Z794 Long term (current) use of insulin: Secondary | ICD-10-CM | POA: Diagnosis not present

## 2021-08-23 DIAGNOSIS — Z794 Long term (current) use of insulin: Secondary | ICD-10-CM | POA: Diagnosis not present

## 2021-08-23 DIAGNOSIS — E113533 Type 2 diabetes mellitus with proliferative diabetic retinopathy with traction retinal detachment not involving the macula, bilateral: Secondary | ICD-10-CM | POA: Diagnosis not present

## 2021-08-29 ENCOUNTER — Ambulatory Visit: Payer: BC Managed Care – PPO | Admitting: Internal Medicine

## 2021-09-08 DIAGNOSIS — E113533 Type 2 diabetes mellitus with proliferative diabetic retinopathy with traction retinal detachment not involving the macula, bilateral: Secondary | ICD-10-CM | POA: Diagnosis not present

## 2021-09-08 DIAGNOSIS — Z794 Long term (current) use of insulin: Secondary | ICD-10-CM | POA: Diagnosis not present

## 2021-09-13 ENCOUNTER — Ambulatory Visit (INDEPENDENT_AMBULATORY_CARE_PROVIDER_SITE_OTHER): Payer: BC Managed Care – PPO | Admitting: Internal Medicine

## 2021-09-13 ENCOUNTER — Encounter: Payer: Self-pay | Admitting: Internal Medicine

## 2021-09-13 ENCOUNTER — Encounter: Payer: Self-pay | Admitting: *Deleted

## 2021-09-13 VITALS — BP 148/92 | HR 80 | Resp 18 | Ht 72.0 in | Wt 244.4 lb

## 2021-09-13 DIAGNOSIS — Z794 Long term (current) use of insulin: Secondary | ICD-10-CM

## 2021-09-13 DIAGNOSIS — E1140 Type 2 diabetes mellitus with diabetic neuropathy, unspecified: Secondary | ICD-10-CM

## 2021-09-13 DIAGNOSIS — B351 Tinea unguium: Secondary | ICD-10-CM

## 2021-09-13 DIAGNOSIS — E113533 Type 2 diabetes mellitus with proliferative diabetic retinopathy with traction retinal detachment not involving the macula, bilateral: Secondary | ICD-10-CM | POA: Diagnosis not present

## 2021-09-13 DIAGNOSIS — E782 Mixed hyperlipidemia: Secondary | ICD-10-CM | POA: Diagnosis not present

## 2021-09-13 DIAGNOSIS — K219 Gastro-esophageal reflux disease without esophagitis: Secondary | ICD-10-CM

## 2021-09-13 DIAGNOSIS — I10 Essential (primary) hypertension: Secondary | ICD-10-CM

## 2021-09-13 MED ORDER — MISC. DEVICES MISC: 0 refills | Status: AC

## 2021-09-13 MED ORDER — HYDRALAZINE HCL 25 MG PO TABS: 25.0000 mg | ORAL_TABLET | Freq: Three times a day (TID) | ORAL | 3 refills | Status: DC

## 2021-09-13 MED ORDER — HYDRALAZINE HCL 25 MG PO TABS: 25.0000 mg | ORAL_TABLET | Freq: Two times a day (BID) | ORAL | 3 refills | Status: DC

## 2021-09-13 MED ORDER — TERBINAFINE HCL 250 MG PO TABS: 250.0000 mg | ORAL_TABLET | Freq: Every day | ORAL | 2 refills | Status: DC

## 2021-09-13 NOTE — Progress Notes (Signed)
Established Patient Office Visit  Subjective:  Patient ID: Mike Bowen, male    DOB: 1980-12-17  Age: 41 y.o. MRN: [id number]  CC:  Chief Complaint  Patient presents with   Diabetes   Hypertension    HPI Mike Bowen is a 41 y.o. male with past medical history of HTN, type 2 DM, obesity and chronic LE swelling who presents for f/u of his chronic medical conditions.  HTN: His BP was elevated in the office today upon multiple measurements. He denies any dizziness, chest pain, dyspnea or palpitations currently.  Type II DM with obesity: He is on glipizide.  He states that he has changed his diet since the last visit and has been trying to follow low-carb diet now.  He has lost about 27 lbs since the last visit.  He did not try Ozempic. He denies any polyuria or polydipsia currently.  He is on Crestor now.  Diabetic retinopathy: He has had ophthalmologic procedures for diabetic retinopathy with traction retainer detachments.  He still has right-sided visual disturbance.  Past Medical History:  Diagnosis Date   Diabetes mellitus without complication ([place])    Hypertension     Past Surgical History:  Procedure Laterality Date   SHOULDER SURGERY      Family History  Problem Relation Age of Onset   High blood pressure Mother    Diabetes Father    Diabetes Other     Social History   Socioeconomic History   Marital status: Single    Spouse name: Not on file   Number of children: Not on file   Years of education: Not on file   Highest education level: Not on file  Occupational History   Not on file  Tobacco Use   Smoking status: Never   Smokeless tobacco: Never  Vaping Use   Vaping Use: Never used  Substance and Sexual Activity   Alcohol use: Yes    Comment: Occas   Drug use: Yes    Types: Marijuana   Sexual activity: Yes    Birth control/protection: None  Other Topics Concern   Not on file  Social History Narrative   Not on file   Social Determinants of  Health   Financial Resource Strain: Not on file  Food Insecurity: Not on file  Transportation Needs: Not on file  Physical Activity: Not on file  Stress: Not on file  Social Connections: Not on file  Intimate Partner Violence: Not on file    Outpatient Medications Prior to Visit  Medication Sig Dispense Refill   amLODipine-olmesartan (AZOR) 10-40 MG tablet Take 1 tablet by mouth daily. 30 tablet 5   benzonatate (TESSALON) 100 MG capsule Take 1 capsule (100 mg total) by mouth 2 (two) times daily as needed for cough. 20 capsule 0   chlorthalidone (HYGROTON) 25 MG tablet Take 1 tablet (25 mg total) by mouth daily. 30 tablet 5   glipiZIDE (GLUCOTROL) 10 MG tablet Take 1 tablet (10 mg total) by mouth 2 (two) times daily. 180 tablet 1   Misc. Devices MISC Compression socks - 1 pair. 1 each 0   omeprazole (PRILOSEC) 20 MG capsule Take 1 capsule (20 mg total) by mouth daily. 30 capsule 5   rosuvastatin (CRESTOR) 10 MG tablet Take 1 tablet (10 mg total) by mouth daily. 90 tablet 1   terbinafine (LAMISIL) 250 MG tablet Take 1 tablet (250 mg total) by mouth daily. 30 tablet 2   Semaglutide, 1 MG/DOSE, 4 MG/3ML SOPN  Inject 1 mg as directed once a week. (Patient not taking: Reported on 09/13/2021) 9 mL 0   No facility-administered medications prior to visit.    No Known Allergies  ROS Review of Systems  Constitutional:  Negative for chills and fever.  HENT:  Negative for congestion and sore throat.   Eyes:  Positive for visual disturbance. Negative for pain and discharge.  Respiratory:  Negative for cough and shortness of breath.   Cardiovascular:  Positive for leg swelling. Negative for chest pain and palpitations.  Gastrointestinal:  Negative for diarrhea, nausea and vomiting.  Endocrine: Negative for polydipsia and polyuria.  Genitourinary:  Negative for dysuria and hematuria.  Musculoskeletal:  Negative for neck pain and neck stiffness.  Skin:  Positive for rash.  Neurological:   Negative for dizziness, weakness, numbness and headaches.  Psychiatric/Behavioral:  Negative for agitation and behavioral problems.       Objective:    Physical Exam Vitals reviewed.  Constitutional:      General: He is not in acute distress.    Appearance: He is not diaphoretic.  HENT:     Head: Normocephalic and atraumatic.     Nose: Nose normal.     Mouth/Throat:     Mouth: Mucous membranes are moist.  Eyes:     General: No scleral icterus.    Extraocular Movements: Extraocular movements intact.  Cardiovascular:     Rate and Rhythm: Normal rate and regular rhythm.     Pulses: Normal pulses.     Heart sounds: Normal heart sounds. No murmur heard. Pulmonary:     Breath sounds: Normal breath sounds. No wheezing or rales.  Musculoskeletal:     Cervical back: Neck supple. No tenderness.     Right lower leg: Edema (Mild) present.     Left lower leg: Edema (Mild) present.  Skin:    General: Skin is warm.     Findings: Rash (Stasis dermatitis bilaterally) present.  Neurological:     General: No focal deficit present.     Mental Status: He is alert and oriented to person, place, and time.     Sensory: No sensory deficit.     Motor: No weakness.  Psychiatric:        Mood and Affect: Mood normal.        Behavior: Behavior normal.     BP (!) 148/92 (BP Location: Right Arm, Cuff Size: Normal)   Pulse 80   Resp 18   Ht 6' (1.829 m)   Wt 244 lb 6.4 oz (110.9 kg)   SpO2 99%   BMI 33.15 kg/m  Wt Readings from Last 3 Encounters:  09/13/21 244 lb 6.4 oz (110.9 kg)  05/23/21 271 lb 9.6 oz (123.2 kg)  04/18/21 276 lb 1.9 oz (125.2 kg)    No results found for: "TSH" Lab Results  Component Value Date   WBC 6.7 12/09/2020   HGB 11.2 (L) 12/09/2020   HCT 33.5 (L) 12/09/2020   MCV 80.9 12/09/2020   PLT 150 12/09/2020   Lab Results  Component Value Date   NA 141 05/20/2021   K 4.2 05/20/2021   CO2 24 05/20/2021   GLUCOSE 115 (H) 05/20/2021   BUN 22 05/20/2021    CREATININE 1.57 (H) 05/20/2021   BILITOT <0.2 05/20/2021   ALKPHOS 83 05/20/2021   AST 35 05/20/2021   ALT 26 05/20/2021   PROT 5.0 (L) 05/20/2021   ALBUMIN 2.6 (L) 05/20/2021   CALCIUM 8.3 (L) 05/20/2021   ANIONGAP 7  12/09/2020   EGFR 56 (L) 05/20/2021   Lab Results  Component Value Date   CHOL 272 (H) 05/20/2021   Lab Results  Component Value Date   HDL 50 05/20/2021   Lab Results  Component Value Date   LDLCALC 204 (H) 05/20/2021   Lab Results  Component Value Date   TRIG 102 05/20/2021   Lab Results  Component Value Date   CHOLHDL 5.4 (H) 05/20/2021   Lab Results  Component Value Date   HGBA1C 7.6 (H) 05/20/2021      Assessment & Plan:   Problem List Items Addressed This Visit       Cardiovascular and Mediastinum   Essential hypertension    BP Readings from Last 1 Encounters:  09/13/21 (!) 148/92  uncontrolled with amlodipine- olmesartan and chlorthalidone Added hydralazine 25 mg twice daily Counseled for compliance with the medications Advised DASH diet and moderate exercise/walking, at least 150 mins/week      Relevant Medications   Misc. Devices MISC   hydrALAZINE (APRESOLINE) 25 MG tablet     Digestive   Gastroesophageal reflux disease without esophagitis    Well controlled now Does not require omeprazole now        Endocrine   Type 2 diabetes mellitus with diabetic neuropathy, with long-term current use of insulin ([place]) - Primary    Lab Results  Component Value Date   HGBA1C 7.6 (H) 05/20/2021  On Glipizide 10 mg BID Advised to follow diabetic diet On statin and  ARB Check CMP and lipid panel Diabetic eye exam: Advised to follow up with Ophthalmology for diabetic eye exam      Relevant Orders   Microalbumin / creatinine urine ratio   Hemoglobin A1c   CMP14+EGFR   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    Dispense:  30 tablet    Refill:  2   DISCONTD: hydrALAZINE (APRESOLINE) 25 MG tablet    Sig: Take 1 tablet (25 mg total) by mouth 3 (three) times daily.    Dispense:  60 tablet    Refill:  3   Misc. Devices MISC    Sig: Blood pressure cuff. 1 each.    Dispense:  1 each    Refill:  0   hydrALAZINE (APRESOLINE) 25 MG tablet    Sig: Take 1 tablet (25 mg total) by mouth 2 (two) times daily.    Dispense:  60 tablet    Refill:  3    Please cancel previous prescription with TID dosing.    Follow-up: Return in about 4 months (around 01/14/2022) for DM and HTN.    Lindell Spar, MD

## 2021-09-13 NOTE — Assessment & Plan Note (Signed)
Started terbinafine If persistent concern, will refer to podiatry

## 2021-09-13 NOTE — Assessment & Plan Note (Signed)
Lab Results  Component Value Date   HGBA1C 7.6 (H) 05/20/2021   On Glipizide 10 mg BID Advised to follow diabetic diet On statin and  ARB Check CMP and lipid panel Diabetic eye exam: Advised to follow up with Ophthalmology for diabetic eye exam

## 2021-09-13 NOTE — Assessment & Plan Note (Signed)
Check lipid profile On Crestor 

## 2021-09-13 NOTE — Assessment & Plan Note (Signed)
BP Readings from Last 1 Encounters:  09/13/21 (!) 148/92   uncontrolled with amlodipine- olmesartan and chlorthalidone Added hydralazine 25 mg twice daily Counseled for compliance with the medications Advised DASH diet and moderate exercise/walking, at least 150 mins/week

## 2021-09-13 NOTE — Patient Instructions (Signed)
Please start taking Hydralazine as prescribed.  Please continue taking other medications as prescribed.  Please continue to follow low carb diet and perform moderate exercise/walking at least 150 mins/week.

## 2021-09-13 NOTE — Assessment & Plan Note (Signed)
Followed by ophthalmology.   

## 2021-09-13 NOTE — Assessment & Plan Note (Signed)
Well controlled now Does not require omeprazole now

## 2021-09-14 LAB — LIPID PANEL
Chol/HDL Ratio: 3.8 ratio (ref 0.0–5.0)
Cholesterol, Total: 132 mg/dL (ref 100–199)
HDL: 35 mg/dL — ABNORMAL LOW (ref >39)
LDL Chol Calc (NIH): 82 mg/dL (ref 0–99)
Triglycerides: 77 mg/dL (ref 0–149)
VLDL Cholesterol Cal: 15 mg/dL (ref 5–40)

## 2021-09-14 LAB — HEMOGLOBIN A1C
Est. average glucose Bld gHb Est-mCnc: 186 mg/dL
Hgb A1c MFr Bld: 8.1 % — ABNORMAL HIGH (ref 4.8–5.6)

## 2021-09-14 LAB — CMP14+EGFR
ALT: 13 IU/L (ref 0–44)
AST: 18 IU/L (ref 0–40)
Albumin/Globulin Ratio: 1.4 (ref 1.2–2.2)
Albumin: 3.5 g/dL — ABNORMAL LOW (ref 4.1–5.1)
Alkaline Phosphatase: 68 IU/L (ref 44–121)
BUN/Creatinine Ratio: 17 (ref 9–20)
BUN: 30 mg/dL — ABNORMAL HIGH (ref 6–24)
Bilirubin Total: 0.2 mg/dL (ref 0.0–1.2)
CO2: 21 mmol/L (ref 20–29)
Calcium: 8.9 mg/dL (ref 8.7–10.2)
Chloride: 108 mmol/L — ABNORMAL HIGH (ref 96–106)
Creatinine, Ser: 1.8 mg/dL — ABNORMAL HIGH (ref 0.76–1.27)
Globulin, Total: 2.5 g/dL (ref 1.5–4.5)
Glucose: 160 mg/dL — ABNORMAL HIGH (ref 70–99)
Potassium: 4.4 mmol/L (ref 3.5–5.2)
Sodium: 141 mmol/L (ref 134–144)
Total Protein: 6 g/dL (ref 6.0–8.5)
eGFR: 48 mL/min/{1.73_m2} — ABNORMAL LOW (ref >59)

## 2021-09-15 LAB — MICROALBUMIN / CREATININE URINE RATIO
Creatinine, Urine: 99.7 mg/dL
Microalb/Creat Ratio: 1299 mg/g creat — ABNORMAL HIGH (ref 0–29)
Microalbumin, Urine: 1295.5 ug/mL

## 2021-10-03 ENCOUNTER — Other Ambulatory Visit (HOSPITAL_COMMUNITY): Payer: Self-pay

## 2021-10-04 ENCOUNTER — Other Ambulatory Visit (HOSPITAL_COMMUNITY): Payer: Self-pay

## 2021-10-06 ENCOUNTER — Other Ambulatory Visit (HOSPITAL_COMMUNITY): Payer: Self-pay

## 2021-10-06 ENCOUNTER — Telehealth: Payer: Self-pay | Admitting: Internal Medicine

## 2021-10-06 ENCOUNTER — Other Ambulatory Visit: Payer: Self-pay | Admitting: *Deleted

## 2021-10-06 DIAGNOSIS — I10 Essential (primary) hypertension: Secondary | ICD-10-CM

## 2021-10-06 DIAGNOSIS — E782 Mixed hyperlipidemia: Secondary | ICD-10-CM

## 2021-10-06 DIAGNOSIS — B351 Tinea unguium: Secondary | ICD-10-CM

## 2021-10-06 DIAGNOSIS — E1140 Type 2 diabetes mellitus with diabetic neuropathy, unspecified: Secondary | ICD-10-CM

## 2021-10-06 DIAGNOSIS — K219 Gastro-esophageal reflux disease without esophagitis: Secondary | ICD-10-CM

## 2021-10-06 MED ORDER — AMLODIPINE-OLMESARTAN 10-40 MG PO TABS
1.0000 | ORAL_TABLET | Freq: Every day | ORAL | 5 refills | Status: DC
  Filled 2021-10-06: qty 30, 30d supply, fill #0
  Filled 2021-11-10: qty 30, 30d supply, fill #1
  Filled 2021-12-19: qty 30, 30d supply, fill #2
  Filled 2022-01-20: qty 30, 30d supply, fill #3

## 2021-10-06 MED ORDER — HYDRALAZINE HCL 25 MG PO TABS
25.0000 mg | ORAL_TABLET | Freq: Two times a day (BID) | ORAL | 3 refills | Status: DC
  Filled 2021-10-06: qty 60, 30d supply, fill #0
  Filled 2021-11-21: qty 60, 30d supply, fill #1
  Filled 2022-01-20: qty 60, 30d supply, fill #2

## 2021-10-06 MED ORDER — TERBINAFINE HCL 250 MG PO TABS
250.0000 mg | ORAL_TABLET | Freq: Every day | ORAL | 2 refills | Status: DC
  Filled 2021-10-06: qty 30, 30d supply, fill #0
  Filled 2021-11-10: qty 30, 30d supply, fill #1

## 2021-10-06 MED ORDER — CHLORTHALIDONE 25 MG PO TABS
25.0000 mg | ORAL_TABLET | Freq: Every day | ORAL | 5 refills | Status: DC
  Filled 2021-10-06: qty 30, 30d supply, fill #0
  Filled 2021-11-10: qty 30, 30d supply, fill #1
  Filled 2021-11-21 – 2021-12-19 (×2): qty 30, 30d supply, fill #2
  Filled 2022-01-20: qty 30, 30d supply, fill #3

## 2021-10-06 MED ORDER — GLIPIZIDE 10 MG PO TABS
10.0000 mg | ORAL_TABLET | Freq: Two times a day (BID) | ORAL | 1 refills | Status: DC
  Filled 2021-10-06: qty 180, 90d supply, fill #0
  Filled 2021-12-19: qty 180, 90d supply, fill #1

## 2021-10-06 MED ORDER — OMEPRAZOLE 20 MG PO CPDR
20.0000 mg | DELAYED_RELEASE_CAPSULE | Freq: Every day | ORAL | 5 refills | Status: DC
  Filled 2021-10-06: qty 30, 30d supply, fill #0

## 2021-10-06 MED ORDER — ROSUVASTATIN CALCIUM 10 MG PO TABS
10.0000 mg | ORAL_TABLET | Freq: Every day | ORAL | 1 refills | Status: DC
  Filled 2021-10-06: qty 90, 90d supply, fill #0
  Filled 2022-01-20: qty 90, 90d supply, fill #1

## 2021-10-06 NOTE — Telephone Encounter (Signed)
Pt called stating his previous pharmacy was [facility]. He is now transferring to [facility]. [facility] is having difficulty sending his info due their fax being down. Pt is wanting to know if transfer all of his active medications to [facility]???  Pt asked to be called when this is completed

## 2021-10-06 NOTE — Telephone Encounter (Signed)
Medication sent to pharmacy please call patient and let him know

## 2021-10-07 ENCOUNTER — Other Ambulatory Visit (HOSPITAL_COMMUNITY): Payer: Self-pay

## 2021-11-10 ENCOUNTER — Other Ambulatory Visit (HOSPITAL_COMMUNITY): Payer: Self-pay

## 2021-11-14 ENCOUNTER — Other Ambulatory Visit: Payer: Self-pay | Admitting: Orthopedic Surgery

## 2021-11-14 DIAGNOSIS — M25561 Pain in right knee: Secondary | ICD-10-CM

## 2021-11-21 ENCOUNTER — Other Ambulatory Visit (HOSPITAL_COMMUNITY): Payer: Self-pay

## 2021-12-19 ENCOUNTER — Other Ambulatory Visit (HOSPITAL_COMMUNITY): Payer: Self-pay

## 2022-01-05 ENCOUNTER — Encounter: Payer: Self-pay | Admitting: Internal Medicine

## 2022-01-10 ENCOUNTER — Ambulatory Visit: Payer: BC Managed Care – PPO | Admitting: Internal Medicine

## 2022-01-20 ENCOUNTER — Other Ambulatory Visit (HOSPITAL_COMMUNITY): Payer: Self-pay

## 2022-01-26 ENCOUNTER — Encounter: Payer: Self-pay | Admitting: Internal Medicine

## 2022-01-26 ENCOUNTER — Ambulatory Visit: Payer: BC Managed Care – PPO | Admitting: Internal Medicine

## 2022-03-27 DIAGNOSIS — H44741 Retained (nonmagnetic) (old) foreign body in posterior wall of globe, right eye: Secondary | ICD-10-CM | POA: Diagnosis not present

## 2022-03-27 DIAGNOSIS — H268 Other specified cataract: Secondary | ICD-10-CM | POA: Diagnosis not present

## 2022-03-27 DIAGNOSIS — Z9889 Other specified postprocedural states: Secondary | ICD-10-CM | POA: Diagnosis not present

## 2022-03-27 DIAGNOSIS — H3341 Traction detachment of retina, right eye: Secondary | ICD-10-CM | POA: Diagnosis not present

## 2022-03-27 DIAGNOSIS — H2511 Age-related nuclear cataract, right eye: Secondary | ICD-10-CM | POA: Diagnosis not present

## 2022-04-27 ENCOUNTER — Encounter: Payer: Self-pay | Admitting: Radiology

## 2022-05-04 ENCOUNTER — Encounter: Payer: Self-pay | Admitting: Internal Medicine

## 2022-05-04 ENCOUNTER — Ambulatory Visit (INDEPENDENT_AMBULATORY_CARE_PROVIDER_SITE_OTHER): Payer: 59 | Admitting: Internal Medicine

## 2022-05-04 ENCOUNTER — Other Ambulatory Visit: Payer: Self-pay | Admitting: Internal Medicine

## 2022-05-04 VITALS — BP 190/114 | HR 82 | Ht 72.0 in | Wt 278.0 lb

## 2022-05-04 DIAGNOSIS — Z794 Long term (current) use of insulin: Secondary | ICD-10-CM | POA: Diagnosis not present

## 2022-05-04 DIAGNOSIS — I10 Essential (primary) hypertension: Secondary | ICD-10-CM | POA: Diagnosis not present

## 2022-05-04 DIAGNOSIS — R600 Localized edema: Secondary | ICD-10-CM | POA: Diagnosis not present

## 2022-05-04 DIAGNOSIS — E1140 Type 2 diabetes mellitus with diabetic neuropathy, unspecified: Secondary | ICD-10-CM

## 2022-05-04 DIAGNOSIS — E782 Mixed hyperlipidemia: Secondary | ICD-10-CM | POA: Diagnosis not present

## 2022-05-04 DIAGNOSIS — E113533 Type 2 diabetes mellitus with proliferative diabetic retinopathy with traction retinal detachment not involving the macula, bilateral: Secondary | ICD-10-CM

## 2022-05-04 LAB — POCT GLYCOSYLATED HEMOGLOBIN (HGB A1C): HbA1c, POC (controlled diabetic range): 9 % — AB (ref 0.0–7.0)

## 2022-05-04 MED ORDER — OZEMPIC (0.25 OR 0.5 MG/DOSE) 2 MG/3ML ~~LOC~~ SOPN: 0.5000 mg | PEN_INJECTOR | SUBCUTANEOUS | 0 refills | Status: DC

## 2022-05-04 MED ORDER — ROSUVASTATIN CALCIUM 10 MG PO TABS: 10.0000 mg | ORAL_TABLET | Freq: Every day | ORAL | 3 refills | Status: DC

## 2022-05-04 MED ORDER — AMLODIPINE-OLMESARTAN 10-40 MG PO TABS: 1.0000 | ORAL_TABLET | Freq: Every day | ORAL | 5 refills | Status: DC

## 2022-05-04 MED ORDER — CHLORTHALIDONE 25 MG PO TABS: 25.0000 mg | ORAL_TABLET | Freq: Every day | ORAL | 5 refills | Status: DC

## 2022-05-04 MED ORDER — GLIPIZIDE 10 MG PO TABS: 10.0000 mg | ORAL_TABLET | Freq: Two times a day (BID) | ORAL | 1 refills | Status: DC

## 2022-05-04 MED ORDER — TRULICITY 0.75 MG/0.5ML ~~LOC~~ SOAJ: 0.7500 mg | SUBCUTANEOUS | 0 refills | Status: DC

## 2022-05-04 MED ORDER — HYDRALAZINE HCL 25 MG PO TABS: 25.0000 mg | ORAL_TABLET | Freq: Two times a day (BID) | ORAL | 0 refills | Status: DC

## 2022-05-04 NOTE — Assessment & Plan Note (Signed)
Check lipid profile On Crestor

## 2022-05-04 NOTE — Assessment & Plan Note (Signed)
Followed by ophthalmology.   

## 2022-05-04 NOTE — Assessment & Plan Note (Signed)
BP Readings from Last 1 Encounters:  05/04/22 (!) 190/114   Uncontrolled as he has run out of his medicines Was on amlodipine- olmesartan and chlorthalidone Had Added hydralazine 25 mg twice daily Restart old regimen for now Counseled for compliance with the medications Advised DASH diet and moderate exercise/walking, at least 150 mins/week

## 2022-05-04 NOTE — Assessment & Plan Note (Signed)
Chronic, due to chronic venous insufficiency, used to have prolonged standing at his previous workplace On chlorthalidone for HTN Advised to perform leg elevation and compression socks as tolerated Advised to follow low-salt diet

## 2022-05-04 NOTE — Progress Notes (Signed)
Established Patient Office Visit  Subjective:  Patient ID: Mike Bowen, male    DOB: 1980-07-01  Age: 42 y.o. MRN: [id number]  CC:  Chief Complaint  Patient presents with   Hypertension    Follow up    HPI Mike Bowen is a 42 y.o. male with past medical history of HTN, type 2 DM, obesity and chronic LE swelling who presents for f/u of his chronic medical conditions.  HTN: His BP was elevated in the office today upon multiple measurements. He has run out his medications due to insurance coverage concern. He denies any dizziness, chest pain, dyspnea or palpitations currently.  Type II DM with obesity: He was on glipizide.  He states that he had changed his diet and had been trying to follow low-carb diet.  He had lost about 27 lbs in the last visit, but has gained weight as he is not watching over his diet lately. He denies any polyuria or polydipsia currently.  He was on Crestor as well.  Has been taking NovoLog 15 units twice daily.  His blood glucose has been around 150-200.  Diabetic retinopathy: He has had ophthalmologic procedures for diabetic retinopathy with traction retainer detachments.  He still has right-sided visual disturbance.  Past Medical History:  Diagnosis Date   Diabetes mellitus without complication ([place])    Hypertension     Past Surgical History:  Procedure Laterality Date   SHOULDER SURGERY      Family History  Problem Relation Age of Onset   High blood pressure Mother    Diabetes Father    Diabetes Other     Social History   Socioeconomic History   Marital status: Single    Spouse name: Not on file   Number of children: Not on file   Years of education: Not on file   Highest education level: Not on file  Occupational History   Not on file  Tobacco Use   Smoking status: Never   Smokeless tobacco: Never  Vaping Use   Vaping Use: Never used  Substance and Sexual Activity   Alcohol use: Yes    Comment: Occas   Drug use: Yes    Types:  Marijuana   Sexual activity: Yes    Birth control/protection: None  Other Topics Concern   Not on file  Social History Narrative   Not on file   Social Determinants of Health   Financial Resource Strain: Not on file  Food Insecurity: Not on file  Transportation Needs: Not on file  Physical Activity: Not on file  Stress: Not on file  Social Connections: Not on file  Intimate Partner Violence: Not on file    Outpatient Medications Prior to Visit  Medication Sig Dispense Refill   Misc. Devices MISC Compression socks - 1 pair. 1 each 0   Misc. Devices MISC Blood pressure cuff. 1 each. 1 each 0   omeprazole (PRILOSEC) 20 MG capsule Take 1 capsule (20 mg total) by mouth daily. 30 capsule 5   terbinafine (LAMISIL) 250 MG tablet Take 1 tablet (250 mg total) by mouth daily. 30 tablet 2   amLODipine-olmesartan (AZOR) 10-40 MG tablet Take 1 tablet by mouth daily. 30 tablet 5   benzonatate (TESSALON) 100 MG capsule Take 1 capsule (100 mg total) by mouth 2 (two) times daily as needed for cough. 20 capsule 0   chlorthalidone (HYGROTON) 25 MG tablet Take 1 tablet (25 mg total) by mouth daily. 30 tablet 5   glipiZIDE (  GLUCOTROL) 10 MG tablet Take 1 tablet (10 mg total) by mouth 2 (two) times daily. 180 tablet 1   hydrALAZINE (APRESOLINE) 25 MG tablet Take 1 tablet (25 mg total) by mouth 2 (two) times daily. 60 tablet 3   rosuvastatin (CRESTOR) 10 MG tablet Take 1 tablet (10 mg total) by mouth daily. 90 tablet 1   No facility-administered medications prior to visit.    No Known Allergies  ROS Review of Systems  Constitutional:  Negative for chills and fever.  HENT:  Negative for congestion and sore throat.   Eyes:  Positive for visual disturbance. Negative for pain and discharge.  Respiratory:  Negative for cough and shortness of breath.   Cardiovascular:  Positive for leg swelling. Negative for chest pain and palpitations.  Gastrointestinal:  Negative for diarrhea, nausea and vomiting.   Endocrine: Negative for polydipsia and polyuria.  Genitourinary:  Negative for dysuria and hematuria.  Musculoskeletal:  Negative for neck pain and neck stiffness.  Skin:  Positive for rash.  Neurological:  Negative for dizziness, weakness, numbness and headaches.  Psychiatric/Behavioral:  Negative for agitation and behavioral problems.       Objective:    Physical Exam Vitals reviewed.  Constitutional:      General: He is not in acute distress.    Appearance: He is not diaphoretic.  HENT:     Head: Normocephalic and atraumatic.     Nose: Nose normal.     Mouth/Throat:     Mouth: Mucous membranes are moist.  Eyes:     General: No scleral icterus.    Extraocular Movements: Extraocular movements intact.  Cardiovascular:     Rate and Rhythm: Normal rate and regular rhythm.     Heart sounds: Normal heart sounds. No murmur heard. Pulmonary:     Breath sounds: Normal breath sounds. No wheezing or rales.  Musculoskeletal:     Cervical back: Neck supple. No tenderness.     Right lower leg: Edema (1+) present.     Left lower leg: Edema (1+) present.  Skin:    General: Skin is warm.     Findings: Rash (Stasis dermatitis bilaterally) present.  Neurological:     General: No focal deficit present.     Mental Status: He is alert and oriented to person, place, and time.     Sensory: No sensory deficit.     Motor: No weakness.  Psychiatric:        Mood and Affect: Mood normal.        Behavior: Behavior normal.     BP (!) 190/114 (BP Location: Right Arm, Cuff Size: Normal)   Pulse 82   Ht 6' (1.829 m)   Wt 278 lb (126.1 kg)   SpO2 98%   BMI 37.70 kg/m  Wt Readings from Last 3 Encounters:  05/04/22 278 lb (126.1 kg)  09/13/21 244 lb 6.4 oz (110.9 kg)  05/23/21 271 lb 9.6 oz (123.2 kg)    No results found for: "TSH" Lab Results  Component Value Date   WBC 6.7 12/09/2020   HGB 11.2 (L) 12/09/2020   HCT 33.5 (L) 12/09/2020   MCV 80.9 12/09/2020   PLT 150 12/09/2020    Lab Results  Component Value Date   NA 141 09/13/2021   K 4.4 09/13/2021   CO2 21 09/13/2021   GLUCOSE 160 (H) 09/13/2021   BUN 30 (H) 09/13/2021   CREATININE 1.80 (H) 09/13/2021   BILITOT 0.2 09/13/2021   ALKPHOS 68 09/13/2021   AST  18 09/13/2021   ALT 13 09/13/2021   PROT 6.0 09/13/2021   ALBUMIN 3.5 (L) 09/13/2021   CALCIUM 8.9 09/13/2021   ANIONGAP 7 12/09/2020   EGFR 48 (L) 09/13/2021   Lab Results  Component Value Date   CHOL 132 09/13/2021   Lab Results  Component Value Date   HDL 35 (L) 09/13/2021   Lab Results  Component Value Date   LDLCALC 82 09/13/2021   Lab Results  Component Value Date   TRIG 77 09/13/2021   Lab Results  Component Value Date   CHOLHDL 3.8 09/13/2021   Lab Results  Component Value Date   HGBA1C 9.0 (A) 05/04/2022      Assessment & Plan:   Problem List Items Addressed This Visit       Cardiovascular and Mediastinum   Essential hypertension - Primary    BP Readings from Last 1 Encounters:  05/04/22 (!) 190/114  Uncontrolled as he has run out of his medicines Was on amlodipine- olmesartan and chlorthalidone Had Added hydralazine 25 mg twice daily Restart old regimen for now Counseled for compliance with the medications Advised DASH diet and moderate exercise/walking, at least 150 mins/week      Relevant Medications   amLODipine-olmesartan (AZOR) 10-40 MG tablet   chlorthalidone (HYGROTON) 25 MG tablet   hydrALAZINE (APRESOLINE) 25 MG tablet   rosuvastatin (CRESTOR) 10 MG tablet     Endocrine   Type 2 diabetes mellitus with diabetic neuropathy, with long-term current use of insulin ([facility])    Lab Results  Component Value Date   HGBA1C 9.0 (A) 05/04/2022  Uncontrolled Was on Glipizide 10 mg BID Added Trulicity A999333 mg qw- increase dose as tolerated DC home insulin now Advised to follow diabetic diet On statin and  ARB Check CMP and lipid panel Diabetic eye exam: Advised to follow up with Ophthalmology for  diabetic eye exam      Relevant Medications   amLODipine-olmesartan (AZOR) 10-40 MG tablet   rosuvastatin (CRESTOR) 10 MG tablet   glipiZIDE (GLUCOTROL) 10 MG tablet   Other Relevant Orders   POCT glycosylated hemoglobin (Hb A1C) (Completed)   CMP14+EGFR   Lipid Profile   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, associated with type 2 diabetes mellitus ([place])    Followed by ophthalmology      Relevant Medications   amLODipine-olmesartan (AZOR) 10-40 MG tablet   rosuvastatin (CRESTOR) 10 MG tablet   glipiZIDE (GLUCOTROL) 10 MG tablet     Other   Bilateral lower extremity edema    Chronic, due to chronic venous insufficiency, used to have prolonged standing at his previous workplace On chlorthalidone for HTN Advised to perform leg elevation and compression socks as tolerated Advised to follow low-salt diet      Mixed hyperlipidemia    Check lipid profile On Crestor      Relevant Medications   amLODipine-olmesartan (AZOR) 10-40 MG tablet   chlorthalidone (HYGROTON) 25 MG tablet   hydrALAZINE (APRESOLINE) 25 MG tablet   rosuvastatin (CRESTOR) 10 MG tablet   Meds ordered this encounter  Medications   amLODipine-olmesartan (AZOR) 10-40 MG tablet    Sig: Take 1 tablet by mouth daily.    Dispense:  30 tablet    Refill:  5   chlorthalidone (HYGROTON) 25 MG tablet    Sig: Take 1 tablet (25 mg total) by mouth daily.    Dispense:  30 tablet    Refill:  5   hydrALAZINE (  APRESOLINE) 25 MG tablet    Sig: Take 1 tablet (25 mg total) by mouth 2 (two) times daily.    Dispense:  60 tablet    Refill:  0   rosuvastatin (CRESTOR) 10 MG tablet    Sig: Take 1 tablet (10 mg total) by mouth daily.    Dispense:  90 tablet    Refill:  3   glipiZIDE (GLUCOTROL) 10 MG tablet    Sig: Take 1 tablet (10 mg total) by mouth 2 (two) times daily.    Dispense:  180 tablet    Refill:  1   DISCONTD: Semaglutide,0.25 or  0.'5MG'$ /DOS, (OZEMPIC, 0.25 OR 0.5 MG/DOSE,) 2 MG/3ML SOPN    Sig: Inject 0.5 mg into the skin every 7 (seven) days.    Dispense:  3 mL    Refill:  0    Follow-up: Return in about 3 weeks (around 05/25/2022) for HTN and DM, EKG.    Lindell Spar, MD

## 2022-05-04 NOTE — Patient Instructions (Signed)
Please start taking Ozempic as prescribed.  Please start taking other medications as prescribed.  Please continue to follow low carb diet and ambulate as tolerated.

## 2022-05-04 NOTE — Assessment & Plan Note (Addendum)
Lab Results  Component Value Date   HGBA1C 9.0 (A) 05/04/2022   Uncontrolled Was on Glipizide 10 mg BID Added Trulicity A999333 mg qw- increase dose as tolerated DC home insulin now Advised to follow diabetic diet On statin and  ARB Check CMP and lipid panel Diabetic eye exam: Advised to follow up with Ophthalmology for diabetic eye exam

## 2022-05-11 DIAGNOSIS — Z794 Long term (current) use of insulin: Secondary | ICD-10-CM | POA: Diagnosis not present

## 2022-05-11 DIAGNOSIS — E113533 Type 2 diabetes mellitus with proliferative diabetic retinopathy with traction retinal detachment not involving the macula, bilateral: Secondary | ICD-10-CM | POA: Diagnosis not present

## 2022-05-19 ENCOUNTER — Other Ambulatory Visit: Payer: Self-pay

## 2022-05-23 DIAGNOSIS — E1140 Type 2 diabetes mellitus with diabetic neuropathy, unspecified: Secondary | ICD-10-CM | POA: Diagnosis not present

## 2022-05-23 DIAGNOSIS — Z794 Long term (current) use of insulin: Secondary | ICD-10-CM | POA: Diagnosis not present

## 2022-05-24 LAB — CMP14+EGFR
ALT: 18 IU/L (ref 0–44)
AST: 22 IU/L (ref 0–40)
Albumin/Globulin Ratio: 1.2 (ref 1.2–2.2)
Albumin: 2.9 g/dL — ABNORMAL LOW (ref 4.1–5.1)
Alkaline Phosphatase: 93 IU/L (ref 44–121)
BUN/Creatinine Ratio: 14 (ref 9–20)
BUN: 31 mg/dL — ABNORMAL HIGH (ref 6–24)
Bilirubin Total: <0.2 mg/dL (ref 0.0–1.2)
CO2: 23 mmol/L (ref 20–29)
Calcium: 8.7 mg/dL (ref 8.7–10.2)
Chloride: 104 mmol/L (ref 96–106)
Creatinine, Ser: 2.14 mg/dL — ABNORMAL HIGH (ref 0.76–1.27)
Globulin, Total: 2.5 g/dL (ref 1.5–4.5)
Glucose: 166 mg/dL — ABNORMAL HIGH (ref 70–99)
Potassium: 5 mmol/L (ref 3.5–5.2)
Sodium: 139 mmol/L (ref 134–144)
Total Protein: 5.4 g/dL — ABNORMAL LOW (ref 6.0–8.5)
eGFR: 39 mL/min/{1.73_m2} — ABNORMAL LOW (ref >59)

## 2022-05-24 LAB — LIPID PANEL
Chol/HDL Ratio: 4.6 ratio (ref 0.0–5.0)
Cholesterol, Total: 218 mg/dL — ABNORMAL HIGH (ref 100–199)
HDL: 47 mg/dL (ref >39)
LDL Chol Calc (NIH): 149 mg/dL — ABNORMAL HIGH (ref 0–99)
Triglycerides: 124 mg/dL (ref 0–149)
VLDL Cholesterol Cal: 22 mg/dL (ref 5–40)

## 2022-05-26 ENCOUNTER — Other Ambulatory Visit: Payer: Self-pay | Admitting: Internal Medicine

## 2022-05-26 DIAGNOSIS — I10 Essential (primary) hypertension: Secondary | ICD-10-CM

## 2022-05-29 ENCOUNTER — Ambulatory Visit (INDEPENDENT_AMBULATORY_CARE_PROVIDER_SITE_OTHER): Payer: 59 | Admitting: Internal Medicine

## 2022-05-29 ENCOUNTER — Encounter: Payer: Self-pay | Admitting: Internal Medicine

## 2022-05-29 VITALS — BP 152/92 | HR 81 | Ht 72.0 in | Wt 264.6 lb

## 2022-05-29 DIAGNOSIS — I10 Essential (primary) hypertension: Secondary | ICD-10-CM | POA: Diagnosis not present

## 2022-05-29 DIAGNOSIS — N1832 Chronic kidney disease, stage 3b: Secondary | ICD-10-CM

## 2022-05-29 DIAGNOSIS — Z794 Long term (current) use of insulin: Secondary | ICD-10-CM

## 2022-05-29 DIAGNOSIS — E782 Mixed hyperlipidemia: Secondary | ICD-10-CM | POA: Diagnosis not present

## 2022-05-29 DIAGNOSIS — E1140 Type 2 diabetes mellitus with diabetic neuropathy, unspecified: Secondary | ICD-10-CM | POA: Diagnosis not present

## 2022-05-29 MED ORDER — HYDRALAZINE HCL 50 MG PO TABS: 50.0000 mg | ORAL_TABLET | Freq: Two times a day (BID) | ORAL | 1 refills | Status: DC

## 2022-05-29 NOTE — Assessment & Plan Note (Signed)
Last CMP reviewed Has progressive CKD - likely in the setting of uncontrolled HTN and DM Needs to improve hydration Referred to nephrology

## 2022-05-29 NOTE — Assessment & Plan Note (Signed)
Lab Results  Component Value Date   HGBA1C 9.0 (A) 05/04/2022   Uncontrolled On Glipizide 10 mg BID Added Trulicity A999333 mg qw- increase dose as tolerated - needs to pick it up from pharmacy DCed home insulin now Advised to follow diabetic diet On statin and  ARB Check CMP and lipid panel Diabetic eye exam: Advised to follow up with Ophthalmology for diabetic eye exam

## 2022-05-29 NOTE — Patient Instructions (Signed)
Please start taking Hydralazine 50 mg twice daily instead of 25 mg.  Please check at pharmacy about Trulicity and contact us if there is any concern.  Please continue to take other medications as prescribed.  Please continue to follow low carb diet and perform moderate exercise/walking at least 150 mins/week.  Please get fasting blood tests done before the next visit.

## 2022-05-29 NOTE — Progress Notes (Signed)
Established Patient Office Visit  Subjective:  Patient ID: Mike Bowen, male    DOB: Apr 02, 1980  Age: 42 y.o. MRN: [id number]  CC:  Chief Complaint  Patient presents with   Hypertension    Three weeks for hypertension , diabetes and EKG    HPI Mike Bowen is a 42 y.o. male with past medical history of HTN, type 2 DM, obesity and chronic LE swelling who presents for f/u of his chronic medical conditions.  HTN: His BP was elevated in the office today upon multiple measurements, but has significantly improved compared to last visit.  He has started taking amlodipine-olmesartan 10-40 mg QD, chlorthalidone 25 mg QD and hydralazine 25 mg BID. He denies any dizziness, chest pain, dyspnea or palpitations currently.  Type II DM with obesity: He is on glipizide.  He has not picked up Trulicity yet.  He states that he had changed his diet and had been trying to follow low-carb diet.  He had lost about 27 lbs, but has gained weight as he is not watching over his diet lately. He denies any polyuria or polydipsia currently.  He was on Crestor as well. His blood glucose has been around 150-200.  Diabetic retinopathy: He has had ophthalmologic procedures for diabetic retinopathy with traction retainer detachments.  He still has right-sided visual disturbance.  Past Medical History:  Diagnosis Date   Diabetes mellitus without complication    Hypertension     Past Surgical History:  Procedure Laterality Date   SHOULDER SURGERY      Family History  Problem Relation Age of Onset   High blood pressure Mother    Diabetes Father    Diabetes Other     Social History   Socioeconomic History   Marital status: Single    Spouse name: Not on file   Number of children: Not on file   Years of education: Not on file   Highest education level: Not on file  Occupational History   Not on file  Tobacco Use   Smoking status: Never   Smokeless tobacco: Never  Vaping Use   Vaping Use: Never used   Substance and Sexual Activity   Alcohol use: Yes    Comment: Occas   Drug use: Yes    Types: Marijuana   Sexual activity: Yes    Birth control/protection: None  Other Topics Concern   Not on file  Social History Narrative   Not on file   Social Determinants of Health   Financial Resource Strain: Not on file  Food Insecurity: Not on file  Transportation Needs: Not on file  Physical Activity: Not on file  Stress: Not on file  Social Connections: Not on file  Intimate Partner Violence: Not on file    Outpatient Medications Prior to Visit  Medication Sig Dispense Refill   amLODipine-olmesartan (AZOR) 10-40 MG tablet Take 1 tablet by mouth daily. 30 tablet 5   chlorthalidone (HYGROTON) 25 MG tablet Take 1 tablet (25 mg total) by mouth daily. 30 tablet 5   Dulaglutide (TRULICITY) A999333 0000000 SOPN Inject 0.75 mg into the skin once a week. 2 mL 0   glipiZIDE (GLUCOTROL) 10 MG tablet Take 1 tablet (10 mg total) by mouth 2 (two) times daily. 180 tablet 1   Misc. Devices MISC Compression socks - 1 pair. 1 each 0   Misc. Devices MISC Blood pressure cuff. 1 each. 1 each 0   omeprazole (PRILOSEC) 20 MG capsule Take 1 capsule (20 mg  total) by mouth daily. 30 capsule 5   rosuvastatin (CRESTOR) 10 MG tablet Take 1 tablet (10 mg total) by mouth daily. 90 tablet 3   hydrALAZINE (APRESOLINE) 25 MG tablet TAKE 1 TABLET BY MOUTH TWICE A DAY 180 tablet 1   terbinafine (LAMISIL) 250 MG tablet Take 1 tablet (250 mg total) by mouth daily. 30 tablet 2   No facility-administered medications prior to visit.    No Known Allergies  ROS Review of Systems  Constitutional:  Negative for chills and fever.  HENT:  Negative for congestion and sore throat.   Eyes:  Positive for visual disturbance. Negative for pain and discharge.  Respiratory:  Negative for cough and shortness of breath.   Cardiovascular:  Positive for leg swelling. Negative for chest pain and palpitations.  Gastrointestinal:  Negative  for diarrhea, nausea and vomiting.  Endocrine: Negative for polydipsia and polyuria.  Genitourinary:  Negative for dysuria and hematuria.  Musculoskeletal:  Negative for neck pain and neck stiffness.  Skin:  Positive for rash.  Neurological:  Negative for dizziness, weakness, numbness and headaches.  Psychiatric/Behavioral:  Negative for agitation and behavioral problems.       Objective:    Physical Exam Vitals reviewed.  Constitutional:      General: He is not in acute distress.    Appearance: He is not diaphoretic.  HENT:     Head: Normocephalic and atraumatic.     Nose: Nose normal.     Mouth/Throat:     Mouth: Mucous membranes are moist.  Eyes:     General: No scleral icterus.    Extraocular Movements: Extraocular movements intact.  Cardiovascular:     Rate and Rhythm: Normal rate and regular rhythm.     Heart sounds: Normal heart sounds. No murmur heard. Pulmonary:     Breath sounds: Normal breath sounds. No wheezing or rales.  Musculoskeletal:     Cervical back: Neck supple. No tenderness.     Right lower leg: Edema (1+) present.     Left lower leg: Edema (1+) present.  Skin:    General: Skin is warm.     Findings: Rash (Stasis dermatitis bilaterally) present.  Neurological:     General: No focal deficit present.     Mental Status: He is alert and oriented to person, place, and time.     Sensory: No sensory deficit.     Motor: No weakness.  Psychiatric:        Mood and Affect: Mood normal.        Behavior: Behavior normal.     BP (!) 152/92 (BP Location: Left Arm, Patient Position: Sitting, Cuff Size: Large)   Pulse 81   Ht 6' (1.829 m)   Wt 264 lb 9.6 oz (120 kg)   SpO2 97%   BMI 35.89 kg/m  Wt Readings from Last 3 Encounters:  05/29/22 264 lb 9.6 oz (120 kg)  05/04/22 278 lb (126.1 kg)  09/13/21 244 lb 6.4 oz (110.9 kg)    No results found for: "TSH" Lab Results  Component Value Date   WBC 6.7 12/09/2020   HGB 11.2 (L) 12/09/2020   HCT 33.5  (L) 12/09/2020   MCV 80.9 12/09/2020   PLT 150 12/09/2020   Lab Results  Component Value Date   NA 139 05/23/2022   K 5.0 05/23/2022   CO2 23 05/23/2022   GLUCOSE 166 (H) 05/23/2022   BUN 31 (H) 05/23/2022   CREATININE 2.14 (H) 05/23/2022   BILITOT <0.2 05/23/2022  ALKPHOS 93 05/23/2022   AST 22 05/23/2022   ALT 18 05/23/2022   PROT 5.4 (L) 05/23/2022   ALBUMIN 2.9 (L) 05/23/2022   CALCIUM 8.7 05/23/2022   ANIONGAP 7 12/09/2020   EGFR 39 (L) 05/23/2022   Lab Results  Component Value Date   CHOL 218 (H) 05/23/2022   Lab Results  Component Value Date   HDL 47 05/23/2022   Lab Results  Component Value Date   LDLCALC 149 (H) 05/23/2022   Lab Results  Component Value Date   TRIG 124 05/23/2022   Lab Results  Component Value Date   CHOLHDL 4.6 05/23/2022   Lab Results  Component Value Date   HGBA1C 9.0 (A) 05/04/2022      Assessment & Plan:   Problem List Items Addressed This Visit       Cardiovascular and Mediastinum   Essential hypertension - Primary    BP Readings from Last 1 Encounters:  05/29/22 (!) 152/92  Uncontrolled with amlodipine- olmesartan and chlorthalidone Had Added hydralazine 25 mg twice daily, increased dose to 50 mg BID Counseled for compliance with the medications Advised DASH diet and moderate exercise/walking, at least 150 mins/week  EKG: Sinus rhythm.  First-degree heart block.  Baseline wandering likely due to movement/artifact.      Relevant Medications   hydrALAZINE (APRESOLINE) 50 MG tablet   Other Relevant Orders   EKG 12-Lead (Completed)     Endocrine   Type 2 diabetes mellitus with diabetic neuropathy, with long-term current use of insulin    Lab Results  Component Value Date   HGBA1C 9.0 (A) 05/04/2022  Uncontrolled On Glipizide 10 mg BID Added Trulicity A999333 mg qw- increase dose as tolerated - needs to pick it up from pharmacy DCed home insulin now Advised to follow diabetic diet On statin and  ARB Check CMP  and lipid panel Diabetic eye exam: Advised to follow up with Ophthalmology for diabetic eye exam      Relevant Orders   CMP14+EGFR   Hemoglobin A1c     Genitourinary   Stage 3b chronic kidney disease    Last CMP reviewed Has progressive CKD - likely in the setting of uncontrolled HTN and DM Needs to improve hydration Referred to nephrology      Relevant Orders   Ambulatory referral to Nephrology     Other   Mixed hyperlipidemia    Checked lipid profile On Crestor, needs to be compliant      Relevant Medications   hydrALAZINE (APRESOLINE) 50 MG tablet   Meds ordered this encounter  Medications   DISCONTD: hydrALAZINE (APRESOLINE) 50 MG tablet    Sig: Take 1 tablet (50 mg total) by mouth 2 (two) times daily.    Dispense:  180 tablet    Refill:  1    Dx Code -I10   hydrALAZINE (APRESOLINE) 50 MG tablet    Sig: Take 1 tablet (50 mg total) by mouth 2 (two) times daily.    Dispense:  180 tablet    Refill:  1    Dx Code -I10    Follow-up: Return in about 10 weeks (around 08/07/2022) for HTN, DM and CKD.    Lindell Spar, MD

## 2022-05-29 NOTE — Assessment & Plan Note (Addendum)
BP Readings from Last 1 Encounters:  05/29/22 (!) 152/92   Uncontrolled with amlodipine- olmesartan and chlorthalidone Had Added hydralazine 25 mg twice daily, increased dose to 50 mg BID Counseled for compliance with the medications Advised DASH diet and moderate exercise/walking, at least 150 mins/week  EKG: Sinus rhythm.  First-degree heart block.  Baseline wandering likely due to movement/artifact.

## 2022-05-29 NOTE — Assessment & Plan Note (Addendum)
Checked lipid profile On Crestor, needs to be compliant

## 2022-05-31 ENCOUNTER — Other Ambulatory Visit: Payer: Self-pay | Admitting: Internal Medicine

## 2022-05-31 DIAGNOSIS — I10 Essential (primary) hypertension: Secondary | ICD-10-CM

## 2022-05-31 DIAGNOSIS — E782 Mixed hyperlipidemia: Secondary | ICD-10-CM

## 2022-05-31 DIAGNOSIS — E1140 Type 2 diabetes mellitus with diabetic neuropathy, unspecified: Secondary | ICD-10-CM

## 2022-06-13 ENCOUNTER — Other Ambulatory Visit (HOSPITAL_COMMUNITY): Payer: Self-pay | Admitting: Nephrology

## 2022-06-13 DIAGNOSIS — E1129 Type 2 diabetes mellitus with other diabetic kidney complication: Secondary | ICD-10-CM

## 2022-06-13 DIAGNOSIS — E1122 Type 2 diabetes mellitus with diabetic chronic kidney disease: Secondary | ICD-10-CM

## 2022-06-13 DIAGNOSIS — I129 Hypertensive chronic kidney disease with stage 1 through stage 4 chronic kidney disease, or unspecified chronic kidney disease: Secondary | ICD-10-CM

## 2022-06-21 ENCOUNTER — Ambulatory Visit (HOSPITAL_COMMUNITY)
Admission: RE | Admit: 2022-06-21 | Discharge: 2022-06-21 | Disposition: A | Discharge status: Home / Self Care | Payer: 59 | Source: Ambulatory Visit | Attending: Nephrology | Admitting: Nephrology

## 2022-06-21 DIAGNOSIS — E1129 Type 2 diabetes mellitus with other diabetic kidney complication: Secondary | ICD-10-CM | POA: Diagnosis not present

## 2022-06-21 DIAGNOSIS — E1122 Type 2 diabetes mellitus with diabetic chronic kidney disease: Secondary | ICD-10-CM

## 2022-06-21 DIAGNOSIS — I129 Hypertensive chronic kidney disease with stage 1 through stage 4 chronic kidney disease, or unspecified chronic kidney disease: Secondary | ICD-10-CM | POA: Diagnosis not present

## 2022-06-21 DIAGNOSIS — N182 Chronic kidney disease, stage 2 (mild): Secondary | ICD-10-CM | POA: Diagnosis not present

## 2022-06-21 DIAGNOSIS — R809 Proteinuria, unspecified: Secondary | ICD-10-CM | POA: Diagnosis not present

## 2022-07-12 ENCOUNTER — Telehealth: Payer: Self-pay | Admitting: Internal Medicine

## 2022-07-12 ENCOUNTER — Other Ambulatory Visit: Payer: Self-pay | Admitting: Internal Medicine

## 2022-07-12 DIAGNOSIS — Z794 Long term (current) use of insulin: Secondary | ICD-10-CM

## 2022-07-12 MED ORDER — TRULICITY 1.5 MG/0.5ML ~~LOC~~ SOAJ
1.5000 mg | SUBCUTANEOUS | 1 refills | Status: DC
Start: 2022-07-12 — End: 2022-12-13

## 2022-07-12 NOTE — Telephone Encounter (Signed)
Patient called said he has the Trulicity 0.5 mg patient calling to see does he need to up to the next dosage. Call patient back at [phone] Pharmacy: [facility]

## 2022-07-12 NOTE — Telephone Encounter (Signed)
Spoke to patient

## 2022-07-19 DIAGNOSIS — Z8249 Family history of ischemic heart disease and other diseases of the circulatory system: Secondary | ICD-10-CM | POA: Diagnosis not present

## 2022-07-19 DIAGNOSIS — Z7985 Long-term (current) use of injectable non-insulin antidiabetic drugs: Secondary | ICD-10-CM | POA: Diagnosis not present

## 2022-07-19 DIAGNOSIS — H547 Unspecified visual loss: Secondary | ICD-10-CM | POA: Diagnosis not present

## 2022-07-19 DIAGNOSIS — E785 Hyperlipidemia, unspecified: Secondary | ICD-10-CM | POA: Diagnosis not present

## 2022-07-19 DIAGNOSIS — Z6835 Body mass index (BMI) 35.0-35.9, adult: Secondary | ICD-10-CM | POA: Diagnosis not present

## 2022-07-19 DIAGNOSIS — E1122 Type 2 diabetes mellitus with diabetic chronic kidney disease: Secondary | ICD-10-CM | POA: Diagnosis not present

## 2022-07-19 DIAGNOSIS — N1832 Chronic kidney disease, stage 3b: Secondary | ICD-10-CM | POA: Diagnosis not present

## 2022-07-19 DIAGNOSIS — Z7984 Long term (current) use of oral hypoglycemic drugs: Secondary | ICD-10-CM | POA: Diagnosis not present

## 2022-07-19 DIAGNOSIS — I129 Hypertensive chronic kidney disease with stage 1 through stage 4 chronic kidney disease, or unspecified chronic kidney disease: Secondary | ICD-10-CM | POA: Diagnosis not present

## 2022-07-19 DIAGNOSIS — E113599 Type 2 diabetes mellitus with proliferative diabetic retinopathy without macular edema, unspecified eye: Secondary | ICD-10-CM | POA: Diagnosis not present

## 2022-07-19 DIAGNOSIS — Z833 Family history of diabetes mellitus: Secondary | ICD-10-CM | POA: Diagnosis not present

## 2022-07-20 DIAGNOSIS — Z794 Long term (current) use of insulin: Secondary | ICD-10-CM | POA: Diagnosis not present

## 2022-07-20 DIAGNOSIS — H25812 Combined forms of age-related cataract, left eye: Secondary | ICD-10-CM | POA: Diagnosis not present

## 2022-07-20 DIAGNOSIS — E1122 Type 2 diabetes mellitus with diabetic chronic kidney disease: Secondary | ICD-10-CM | POA: Diagnosis not present

## 2022-07-20 DIAGNOSIS — H1131 Conjunctival hemorrhage, right eye: Secondary | ICD-10-CM | POA: Diagnosis not present

## 2022-07-20 DIAGNOSIS — Z961 Presence of intraocular lens: Secondary | ICD-10-CM | POA: Diagnosis not present

## 2022-07-20 DIAGNOSIS — D638 Anemia in other chronic diseases classified elsewhere: Secondary | ICD-10-CM | POA: Diagnosis not present

## 2022-07-20 DIAGNOSIS — R809 Proteinuria, unspecified: Secondary | ICD-10-CM | POA: Diagnosis not present

## 2022-07-20 DIAGNOSIS — Z79899 Other long term (current) drug therapy: Secondary | ICD-10-CM | POA: Diagnosis not present

## 2022-07-20 DIAGNOSIS — E113523 Type 2 diabetes mellitus with proliferative diabetic retinopathy with traction retinal detachment involving the macula, bilateral: Secondary | ICD-10-CM | POA: Diagnosis not present

## 2022-07-20 DIAGNOSIS — N189 Chronic kidney disease, unspecified: Secondary | ICD-10-CM | POA: Diagnosis not present

## 2022-07-20 DIAGNOSIS — I5032 Chronic diastolic (congestive) heart failure: Secondary | ICD-10-CM | POA: Diagnosis not present

## 2022-07-20 LAB — HM DIABETES EYE EXAM

## 2022-08-08 ENCOUNTER — Encounter: Payer: Self-pay | Admitting: Internal Medicine

## 2022-08-08 ENCOUNTER — Ambulatory Visit (INDEPENDENT_AMBULATORY_CARE_PROVIDER_SITE_OTHER): Payer: 59 | Admitting: Internal Medicine

## 2022-08-08 VITALS — BP 154/86 | HR 91 | Ht 72.0 in | Wt 263.2 lb

## 2022-08-08 DIAGNOSIS — E782 Mixed hyperlipidemia: Secondary | ICD-10-CM

## 2022-08-08 DIAGNOSIS — Z794 Long term (current) use of insulin: Secondary | ICD-10-CM | POA: Diagnosis not present

## 2022-08-08 DIAGNOSIS — I10 Essential (primary) hypertension: Secondary | ICD-10-CM | POA: Diagnosis not present

## 2022-08-08 DIAGNOSIS — N1832 Chronic kidney disease, stage 3b: Secondary | ICD-10-CM | POA: Diagnosis not present

## 2022-08-08 DIAGNOSIS — E1140 Type 2 diabetes mellitus with diabetic neuropathy, unspecified: Secondary | ICD-10-CM

## 2022-08-08 LAB — POCT GLYCOSYLATED HEMOGLOBIN (HGB A1C): HbA1c, POC (controlled diabetic range): 7.7 % — AB (ref 0.0–7.0)

## 2022-08-08 LAB — HM DIABETES EYE EXAM

## 2022-08-08 MED ORDER — HYDRALAZINE HCL 100 MG PO TABS: 100.0000 mg | ORAL_TABLET | Freq: Two times a day (BID) | ORAL | 1 refills | Status: DC

## 2022-08-08 MED ORDER — BLOOD GLUCOSE MONITOR KIT
PACK | 0 refills | Status: AC
Start: 2022-08-08 — End: ?

## 2022-08-08 NOTE — Assessment & Plan Note (Signed)
Checked lipid profile On Crestor, needs to be compliant 

## 2022-08-08 NOTE — Progress Notes (Signed)
Established Patient Office Visit  Subjective:  Patient ID: Mike Bowen, male    DOB: 06-27-1980  Age: 42 y.o. MRN: [id number]  CC:  Chief Complaint  Patient presents with   Diabetes    Ten week follow up, patient states he feels he's getting over a stomach virus    Hypertension    Ten week follow up     HPI Mike Bowen is a 42 y.o. male with past medical history of HTN, type 2 DM, obesity and chronic LE swelling who presents for f/u of his chronic medical conditions.  HTN: His BP was elevated in the office today upon multiple measurements, and has been similar at home as well.  He has started taking amlodipine-olmesartan 10-40 mg QD, chlorthalidone 25 mg QD and hydralazine 50 mg BID. He denies any dizziness, chest pain, dyspnea or palpitations currently.  Type II DM with obesity: He is on glipizide and Trulicity 1.5 mg qw.  He was able to start higher dose of Trulicity just yesterday.  He reports 1 episode of hypoglycemia - 70s. He has run out of blood glucose strips, but reports that he feels dizzy and has excessive sweating when he has hypoglycemia.  He states that he had changed his diet and had been trying to follow low-carb diet.  He denies any polyuria or polydipsia currently.  He was on Crestor as well. His blood glucose has been around 150-200.  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  He still has right-sided visual disturbance.  Past Medical History:  Diagnosis Date   Diabetes mellitus without complication (HCC)    Hypertension     Past Surgical History:  Procedure Laterality Date   SHOULDER SURGERY      Family History  Problem Relation Age of Onset   High blood pressure Mother    Diabetes Father    Diabetes Other     Social History   Socioeconomic History   Marital status: Single    Spouse name: Not on file   Number of children: Not on file   Years of education: Not on file    Highest education level: 12th grade  Occupational History   Not on file  Tobacco Use   Smoking status: Never   Smokeless tobacco: Never  Vaping Use   Vaping Use: Never used  Substance and Sexual Activity   Alcohol use: Yes    Comment: Occas   Drug use: Yes    Types: Marijuana   Sexual activity: Yes    Birth control/protection: None  Other Topics Concern   Not on file  Social History Narrative   Not on file   Social Determinants of Health   Financial Resource Strain: Medium Risk (08/07/2022)   Overall Financial Resource Strain (CARDIA)    Difficulty of Paying Living Expenses: Somewhat hard  Food Insecurity: Food Insecurity Present (08/07/2022)   Hunger Vital Sign    Worried About Running Out of Food in the Last Year: Often true    Ran Out of Food in the Last Year: Often true  Transportation Needs: No Transportation Needs (08/07/2022)   PRAPARE - Administrator, Civil Service (Medical): No    Lack of Transportation (Non-Medical): No  Physical Activity: Unknown (08/07/2022)   Exercise Vital Sign    Days of Exercise per Week: 0 days    Minutes of Exercise per Session: Not on file  Stress: No Stress Concern Present (08/07/2022)  [employer] of Occupational Health - Occupational Stress Questionnaire    Feeling of Stress : Only a little  Social Connections: Moderately Isolated (08/07/2022)   Social Connection and Isolation Panel [NHANES]    Frequency of Communication with Friends and Family: More than three times a week    Frequency of Social Gatherings with Friends and Family: Once a week    Attends Religious Services: Never    Database administrator or Organizations: Yes    Attends Engineer, structural: More than 4 times per year    Marital Status: Never married  Catering manager Violence: Not on file    Outpatient Medications Prior to Visit  Medication Sig Dispense Refill   amLODipine-olmesartan (AZOR) 10-40 MG tablet TAKE 1 TABLET BY MOUTH  EVERY DAY 30 tablet 5   chlorthalidone (HYGROTON) 25 MG tablet TAKE 1 TABLET BY MOUTH EVERY DAY 30 tablet 5   Dulaglutide (TRULICITY) 1.5 MG/0.5ML SOPN Inject 1.5 mg into the skin once a week. 6 mL 1   glipiZIDE (GLUCOTROL) 10 MG tablet TAKE 1 TABLET BY MOUTH TWICE A DAY 180 tablet 1   Misc. Devices MISC Compression socks - 1 pair. 1 each 0   Misc. Devices MISC Blood pressure cuff. 1 each. 1 each 0   omeprazole (PRILOSEC) 20 MG capsule Take 1 capsule (20 mg total) by mouth daily. 30 capsule 5   rosuvastatin (CRESTOR) 10 MG tablet TAKE 1 TABLET BY MOUTH EVERY DAY 90 tablet 1   Dulaglutide (TRULICITY) 0.75 MG/0.5ML SOPN INJECT 0.75 MG SUBCUTANEOUSLY ONE TIME PER WEEK 2 mL 0   hydrALAZINE (APRESOLINE) 50 MG tablet Take 1 tablet (50 mg total) by mouth 2 (two) times daily. 180 tablet 1   No facility-administered medications prior to visit.    No Known Allergies  ROS Review of Systems  Constitutional:  Negative for chills and fever.  HENT:  Negative for congestion and sore throat.   Eyes:  Positive for visual disturbance. Negative for pain and discharge.  Respiratory:  Negative for cough and shortness of breath.   Cardiovascular:  Positive for leg swelling. Negative for chest pain and palpitations.  Gastrointestinal:  Negative for diarrhea, nausea and vomiting.  Endocrine: Negative for polydipsia and polyuria.  Genitourinary:  Negative for dysuria and hematuria.  Musculoskeletal:  Negative for neck pain and neck stiffness.  Skin:  Positive for rash.  Neurological:  Negative for dizziness, weakness, numbness and headaches.  Psychiatric/Behavioral:  Negative for agitation and behavioral problems.       Objective:    Physical Exam Vitals reviewed.  Constitutional:      General: He is not in acute distress.    Appearance: He is not diaphoretic.  HENT:     Head: Normocephalic and atraumatic.     Nose: Nose normal.     Mouth/Throat:     Mouth: Mucous membranes are moist.  Eyes:      General: No scleral icterus.    Extraocular Movements: Extraocular movements intact.  Cardiovascular:     Rate and Rhythm: Normal rate and regular rhythm.     Heart sounds: Normal heart sounds. No murmur heard. Pulmonary:     Breath sounds: Normal breath sounds. No wheezing or rales.  Musculoskeletal:     Cervical back: Neck supple. No tenderness.     Right lower leg: Edema (1+) present.     Left lower leg: Edema (1+) present.  Skin:    General: Skin is warm.     Findings: Rash (  Stasis dermatitis bilaterally) present.  Neurological:     General: No focal deficit present.     Mental Status: He is alert and oriented to person, place, and time.     Sensory: No sensory deficit.     Motor: No weakness.  Psychiatric:        Mood and Affect: Mood normal.        Behavior: Behavior normal.     BP (!) 154/86 (BP Location: Left Arm)   Pulse 91   Ht 6' (1.829 m)   Wt 263 lb 3.2 oz (119.4 kg)   SpO2 97%   BMI 35.70 kg/m  Wt Readings from Last 3 Encounters:  08/08/22 263 lb 3.2 oz (119.4 kg)  05/29/22 264 lb 9.6 oz (120 kg)  05/04/22 278 lb (126.1 kg)    No results found for: "TSH" Lab Results  Component Value Date   WBC 6.7 12/09/2020   HGB 11.2 (L) 12/09/2020   HCT 33.5 (L) 12/09/2020   MCV 80.9 12/09/2020   PLT 150 12/09/2020   Lab Results  Component Value Date   NA 139 05/23/2022   K 5.0 05/23/2022   CO2 23 05/23/2022   GLUCOSE 166 (H) 05/23/2022   BUN 31 (H) 05/23/2022   CREATININE 2.14 (H) 05/23/2022   BILITOT <0.2 05/23/2022   ALKPHOS 93 05/23/2022   AST 22 05/23/2022   ALT 18 05/23/2022   PROT 5.4 (L) 05/23/2022   ALBUMIN 2.9 (L) 05/23/2022   CALCIUM 8.7 05/23/2022   ANIONGAP 7 12/09/2020   EGFR 39 (L) 05/23/2022   Lab Results  Component Value Date   CHOL 218 (H) 05/23/2022   Lab Results  Component Value Date   HDL 47 05/23/2022   Lab Results  Component Value Date   LDLCALC 149 (H) 05/23/2022   Lab Results  Component Value Date   TRIG 124  05/23/2022   Lab Results  Component Value Date   CHOLHDL 4.6 05/23/2022   Lab Results  Component Value Date   HGBA1C 7.7 (A) 08/08/2022      Assessment & Plan:   Problem List Items Addressed This Visit       Cardiovascular and Mediastinum   Essential hypertension - Primary    BP Readings from Last 1 Encounters:  05/29/22 (!) 152/92  Uncontrolled with amlodipine- olmesartan and chlorthalidone Had added hydralazine, increased dose to 100 mg BID Counseled for compliance with the medications Advised DASH diet and moderate exercise/walking, at least 150 mins/week      Relevant Medications   hydrALAZINE (APRESOLINE) 100 MG tablet     Endocrine   Type 2 diabetes mellitus with diabetic neuropathy, with long-term current use of insulin (HCC)    Lab Results  Component Value Date   HGBA1C 7.7 (A) 08/08/2022  Uncontrolled, but improved On Glipizide 10 mg BID On Trulicity 1.5 mg qw- increase dose as tolerated DCed home insulin now Advised to follow diabetic diet On statin and  ARB Check CMP and lipid panel Diabetic eye exam: Advised to follow up with Ophthalmology for diabetic eye exam      Relevant Medications   blood glucose meter kit and supplies KIT   Other Relevant Orders   POCT glycosylated hemoglobin (Hb A1C) (Completed)     Genitourinary   Stage 3b chronic kidney disease (HCC)    Last CMP reviewed Has progressive CKD - likely in the setting of uncontrolled HTN and DM Needs to improve hydration Referred to nephrology - needs to follow up  Other   Mixed hyperlipidemia    Checked lipid profile On Crestor, needs to be compliant      Relevant Medications   hydrALAZINE (APRESOLINE) 100 MG tablet   Meds ordered this encounter  Medications   hydrALAZINE (APRESOLINE) 100 MG tablet    Sig: Take 1 tablet (100 mg total) by mouth 2 (two) times daily.    Dispense:  180 tablet    Refill:  1    Dose change - 08/08/22. Please cancel previous Hydralazine  prescriptions.   blood glucose meter kit and supplies KIT    Sig: Dispense based on patient and insurance preference. Use up to four times daily as directed.    Dispense:  1 each    Refill:  0    Order Specific Question:   Number of strips    Answer:   100    Order Specific Question:   Number of lancets    Answer:   100    Follow-up: Return in about 4 months (around 12/08/2022) for HTN and DM.    Anabel Halon, MD

## 2022-08-08 NOTE — Assessment & Plan Note (Addendum)
BP Readings from Last 1 Encounters:  05/29/22 (!) 152/92   Uncontrolled with amlodipine- olmesartan and chlorthalidone Had added hydralazine, increased dose to 100 mg BID Counseled for compliance with the medications Advised DASH diet and moderate exercise/walking, at least 150 mins/week

## 2022-08-08 NOTE — Patient Instructions (Signed)
Please start taking Hydralazine 100 mg twice daily.  Please continue to take medications as prescribed.  Please continue to follow low carb diet and perform moderate exercise/walking at least 150 mins/week.

## 2022-08-08 NOTE — Assessment & Plan Note (Addendum)
Lab Results  Component Value Date   HGBA1C 7.7 (A) 08/08/2022   Uncontrolled, but improved On Glipizide 10 mg BID On Trulicity 1.5 mg qw- increase dose as tolerated DCed home insulin now Advised to follow diabetic diet On statin and  ARB Check CMP and lipid panel Diabetic eye exam: Advised to follow up with Ophthalmology for diabetic eye exam

## 2022-08-08 NOTE — Assessment & Plan Note (Signed)
Last CMP reviewed Has progressive CKD - likely in the setting of uncontrolled HTN and DM Needs to improve hydration Referred to nephrology - needs to follow up

## 2022-08-15 ENCOUNTER — Other Ambulatory Visit: Payer: Self-pay | Admitting: Internal Medicine

## 2022-08-15 DIAGNOSIS — E1140 Type 2 diabetes mellitus with diabetic neuropathy, unspecified: Secondary | ICD-10-CM

## 2022-08-26 DIAGNOSIS — R809 Proteinuria, unspecified: Secondary | ICD-10-CM | POA: Diagnosis not present

## 2022-08-26 DIAGNOSIS — N1832 Chronic kidney disease, stage 3b: Secondary | ICD-10-CM | POA: Diagnosis not present

## 2022-08-26 DIAGNOSIS — E1129 Type 2 diabetes mellitus with other diabetic kidney complication: Secondary | ICD-10-CM | POA: Diagnosis not present

## 2022-08-26 DIAGNOSIS — N2581 Secondary hyperparathyroidism of renal origin: Secondary | ICD-10-CM | POA: Diagnosis not present

## 2022-08-28 ENCOUNTER — Other Ambulatory Visit (HOSPITAL_COMMUNITY): Payer: Self-pay | Admitting: Nurse Practitioner

## 2022-08-28 DIAGNOSIS — R809 Proteinuria, unspecified: Secondary | ICD-10-CM

## 2022-08-28 DIAGNOSIS — N1832 Chronic kidney disease, stage 3b: Secondary | ICD-10-CM

## 2022-08-28 DIAGNOSIS — E1129 Type 2 diabetes mellitus with other diabetic kidney complication: Secondary | ICD-10-CM

## 2022-09-11 ENCOUNTER — Other Ambulatory Visit: Payer: Self-pay | Admitting: Internal Medicine

## 2022-09-11 DIAGNOSIS — E1140 Type 2 diabetes mellitus with diabetic neuropathy, unspecified: Secondary | ICD-10-CM

## 2022-09-27 DIAGNOSIS — H401134 Primary open-angle glaucoma, bilateral, indeterminate stage: Secondary | ICD-10-CM | POA: Diagnosis not present

## 2022-09-27 DIAGNOSIS — H268 Other specified cataract: Secondary | ICD-10-CM | POA: Diagnosis not present

## 2022-09-28 ENCOUNTER — Telehealth: Payer: Self-pay | Admitting: Internal Medicine

## 2022-09-28 DIAGNOSIS — Z794 Long term (current) use of insulin: Secondary | ICD-10-CM | POA: Diagnosis not present

## 2022-09-28 DIAGNOSIS — E113533 Type 2 diabetes mellitus with proliferative diabetic retinopathy with traction retinal detachment not involving the macula, bilateral: Secondary | ICD-10-CM | POA: Diagnosis not present

## 2022-09-28 NOTE — Telephone Encounter (Signed)
Patient called said medicine cost $300.00 on his Dulaglutide (TRULICITY) 1.5 MG/0.5ML SOPN [[id number]  needs prior authorization?

## 2022-09-29 NOTE — Telephone Encounter (Signed)
PA submitted, waiting on response.

## 2022-10-02 DIAGNOSIS — I10 Essential (primary) hypertension: Secondary | ICD-10-CM | POA: Diagnosis not present

## 2022-10-02 DIAGNOSIS — I129 Hypertensive chronic kidney disease with stage 1 through stage 4 chronic kidney disease, or unspecified chronic kidney disease: Secondary | ICD-10-CM | POA: Diagnosis not present

## 2022-10-02 DIAGNOSIS — D631 Anemia in chronic kidney disease: Secondary | ICD-10-CM | POA: Diagnosis not present

## 2022-10-02 DIAGNOSIS — R809 Proteinuria, unspecified: Secondary | ICD-10-CM | POA: Diagnosis not present

## 2022-10-02 DIAGNOSIS — N189 Chronic kidney disease, unspecified: Secondary | ICD-10-CM | POA: Diagnosis not present

## 2022-10-02 DIAGNOSIS — N1832 Chronic kidney disease, stage 3b: Secondary | ICD-10-CM | POA: Diagnosis not present

## 2022-10-02 LAB — MICROALBUMIN / CREATININE URINE RATIO
Creatinine, POC: 83.5 mg/dL
EGFR: 34
Microalb Creat Ratio: 3749

## 2022-10-04 DIAGNOSIS — E559 Vitamin D deficiency, unspecified: Secondary | ICD-10-CM | POA: Diagnosis not present

## 2022-10-04 DIAGNOSIS — N1832 Chronic kidney disease, stage 3b: Secondary | ICD-10-CM | POA: Diagnosis not present

## 2022-10-04 DIAGNOSIS — I5032 Chronic diastolic (congestive) heart failure: Secondary | ICD-10-CM | POA: Diagnosis not present

## 2022-10-04 DIAGNOSIS — R808 Other proteinuria: Secondary | ICD-10-CM | POA: Diagnosis not present

## 2022-10-06 ENCOUNTER — Other Ambulatory Visit: Payer: Self-pay | Admitting: Radiology

## 2022-10-06 DIAGNOSIS — R809 Proteinuria, unspecified: Secondary | ICD-10-CM

## 2022-10-06 MED ORDER — SODIUM CHLORIDE 0.9 % IV SOLN
INTRAVENOUS | Status: DC
Start: 2022-10-07 — End: 2022-12-13

## 2022-10-09 ENCOUNTER — Encounter (HOSPITAL_COMMUNITY): Payer: Self-pay

## 2022-10-09 ENCOUNTER — Ambulatory Visit (HOSPITAL_COMMUNITY): Payer: 59

## 2022-10-09 NOTE — Telephone Encounter (Signed)
Spoke to patient, per cover my meds, PA not required

## 2022-10-09 NOTE — Telephone Encounter (Signed)
Patient calling to follow up on PA. Please advise Thank you

## 2022-10-12 ENCOUNTER — Other Ambulatory Visit: Payer: Self-pay | Admitting: Internal Medicine

## 2022-10-12 DIAGNOSIS — E1140 Type 2 diabetes mellitus with diabetic neuropathy, unspecified: Secondary | ICD-10-CM

## 2022-10-18 DIAGNOSIS — E559 Vitamin D deficiency, unspecified: Secondary | ICD-10-CM | POA: Diagnosis not present

## 2022-10-18 DIAGNOSIS — N1832 Chronic kidney disease, stage 3b: Secondary | ICD-10-CM | POA: Diagnosis not present

## 2022-10-18 DIAGNOSIS — R808 Other proteinuria: Secondary | ICD-10-CM | POA: Diagnosis not present

## 2022-10-18 DIAGNOSIS — I5032 Chronic diastolic (congestive) heart failure: Secondary | ICD-10-CM | POA: Diagnosis not present

## 2022-11-07 ENCOUNTER — Other Ambulatory Visit: Payer: Self-pay | Admitting: Radiology

## 2022-11-08 ENCOUNTER — Encounter (HOSPITAL_COMMUNITY): Payer: Self-pay

## 2022-11-08 ENCOUNTER — Ambulatory Visit (HOSPITAL_COMMUNITY)
Admission: RE | Admit: 2022-11-08 | Discharge: 2022-11-08 | Disposition: A | Discharge status: Home / Self Care | Payer: 59 | Source: Ambulatory Visit | Attending: Nurse Practitioner | Admitting: Nurse Practitioner

## 2022-11-08 ENCOUNTER — Other Ambulatory Visit: Payer: Self-pay

## 2022-11-08 DIAGNOSIS — I129 Hypertensive chronic kidney disease with stage 1 through stage 4 chronic kidney disease, or unspecified chronic kidney disease: Secondary | ICD-10-CM | POA: Insufficient documentation

## 2022-11-08 DIAGNOSIS — R809 Proteinuria, unspecified: Secondary | ICD-10-CM | POA: Diagnosis not present

## 2022-11-08 DIAGNOSIS — E1122 Type 2 diabetes mellitus with diabetic chronic kidney disease: Secondary | ICD-10-CM | POA: Insufficient documentation

## 2022-11-08 DIAGNOSIS — E1129 Type 2 diabetes mellitus with other diabetic kidney complication: Secondary | ICD-10-CM

## 2022-11-08 DIAGNOSIS — N1832 Chronic kidney disease, stage 3b: Secondary | ICD-10-CM | POA: Insufficient documentation

## 2022-11-08 LAB — CBC
HCT: 35.6 % — ABNORMAL LOW (ref 39.0–52.0)
Hemoglobin: 11.3 g/dL — ABNORMAL LOW (ref 13.0–17.0)
MCH: 25.1 pg — ABNORMAL LOW (ref 26.0–34.0)
MCHC: 31.7 g/dL (ref 30.0–36.0)
MCV: 78.9 fL — ABNORMAL LOW (ref 80.0–100.0)
Platelets: 204 10*3/uL (ref 150–400)
RBC: 4.51 MIL/uL (ref 4.22–5.81)
RDW: 13.2 % (ref 11.5–15.5)
WBC: 7.5 10*3/uL (ref 4.0–10.5)
nRBC: 0 % (ref 0.0–0.2)

## 2022-11-08 LAB — GLUCOSE, CAPILLARY
Glucose-Capillary: 132 mg/dL — ABNORMAL HIGH (ref 70–99)
Glucose-Capillary: 148 mg/dL — ABNORMAL HIGH (ref 70–99)

## 2022-11-08 LAB — PROTIME-INR
INR: 0.9 (ref 0.8–1.2)
Prothrombin Time: 12.4 s (ref 11.4–15.2)

## 2022-11-08 MED ORDER — MIDAZOLAM HCL 2 MG/2ML IJ SOLN
INTRAMUSCULAR | Status: AC
  Filled 2022-11-08: qty 4

## 2022-11-08 MED ORDER — MIDAZOLAM HCL 2 MG/2ML IJ SOLN
INTRAMUSCULAR | Status: AC | PRN
  Administered 2022-11-08: 1 mg via INTRAVENOUS

## 2022-11-08 MED ORDER — FENTANYL CITRATE (PF) 100 MCG/2ML IJ SOLN
INTRAMUSCULAR | Status: AC | PRN
Start: 2022-11-08 — End: 2022-11-08
  Administered 2022-11-08: 50 ug via INTRAVENOUS

## 2022-11-08 MED ORDER — SODIUM CHLORIDE 0.9 % IV SOLN: INTRAVENOUS | Status: DC

## 2022-11-08 MED ORDER — FENTANYL CITRATE (PF) 100 MCG/2ML IJ SOLN
INTRAMUSCULAR | Status: AC
  Filled 2022-11-08: qty 4

## 2022-11-08 MED ORDER — HYDRALAZINE HCL 20 MG/ML IJ SOLN
INTRAMUSCULAR | Status: AC
  Filled 2022-11-08: qty 1

## 2022-11-08 MED ORDER — LIDOCAINE-EPINEPHRINE 1 %-1:100000 IJ SOLN
9.0000 mL | Freq: Once | INTRAMUSCULAR | Status: AC
  Administered 2022-11-08: 9 mL via INTRADERMAL

## 2022-11-08 MED ORDER — HYDRALAZINE HCL 20 MG/ML IJ SOLN
INTRAMUSCULAR | Status: AC | PRN
  Administered 2022-11-08: 20 mg via INTRAVENOUS

## 2022-11-08 MED ORDER — HYDRALAZINE HCL 50 MG PO TABS
100.0000 mg | ORAL_TABLET | ORAL | Status: AC
  Administered 2022-11-08: 100 mg via ORAL
  Filled 2022-11-08: qty 2

## 2022-11-08 MED ORDER — AMLODIPINE BESYLATE 10 MG PO TABS
10.0000 mg | ORAL_TABLET | ORAL | Status: AC
  Administered 2022-11-08: 10 mg via ORAL
  Filled 2022-11-08: qty 1

## 2022-11-08 MED ORDER — IRBESARTAN 300 MG PO TABS
300.0000 mg | ORAL_TABLET | ORAL | Status: AC
  Administered 2022-11-08: 300 mg via ORAL
  Filled 2022-11-08: qty 1

## 2022-11-08 NOTE — H&P (Signed)
Chief Complaint: Patient was seen in consultation today for random renal biopsy  at the request of Dr Wolfgang Phoenix   Supervising Physician: Simonne Come  Patient Status: [facility] - Out-pt  History of Present Illness: Mike Bowen is a 42 y.o. male   FULL Code status per pt CKD 3 Hypertension Proteinuria DM Follows with Dr Wolfgang Phoenix  Noted: 176/106 this am- Pt did not take home meds---now ordered  Scheduled for random renal biopsy  Past Medical History:  Diagnosis Date   Diabetes mellitus without complication (HCC)    Hypertension     Past Surgical History:  Procedure Laterality Date   SHOULDER SURGERY      Allergies: Patient has no known allergies.  Medications: Prior to Admission medications   Medication Sig Start Date End Date Taking? Authorizing Provider  amLODipine-olmesartan (AZOR) 10-40 MG tablet TAKE 1 TABLET BY MOUTH EVERY DAY 05/31/22  Yes Anabel Halon, MD  chlorthalidone (HYGROTON) 25 MG tablet TAKE 1 TABLET BY MOUTH EVERY DAY 05/31/22  Yes Anabel Halon, MD  hydrALAZINE (APRESOLINE) 100 MG tablet Take 1 tablet (100 mg total) by mouth 2 (two) times daily. 08/08/22  Yes Anabel Halon, MD  omeprazole (PRILOSEC) 20 MG capsule Take 1 capsule (20 mg total) by mouth daily. 10/06/21  Yes Anabel Halon, MD  rosuvastatin (CRESTOR) 10 MG tablet TAKE 1 TABLET BY MOUTH EVERY DAY 05/31/22  Yes Anabel Halon, MD  blood glucose meter kit and supplies KIT Dispense based on patient and insurance preference. Use up to four times daily as directed. 08/08/22   Anabel Halon, MD  Dulaglutide (TRULICITY) 1.5 MG/0.5ML SOPN Inject 1.5 mg into the skin once a week. 07/12/22   Anabel Halon, MD  glipiZIDE (GLUCOTROL) 10 MG tablet TAKE 1 TABLET BY MOUTH TWICE A DAY 05/31/22   Anabel Halon, MD  Misc. Devices MISC Compression socks - 1 pair. 05/23/21   Anabel Halon, MD  Misc. Devices MISC Blood pressure cuff. 1 each. 09/13/21   Anabel Halon, MD     Family History  Problem  Relation Age of Onset   High blood pressure Mother    Diabetes Father    Diabetes Other     Social History   Socioeconomic History   Marital status: Single    Spouse name: Not on file   Number of children: Not on file   Years of education: Not on file   Highest education level: 12th grade  Occupational History   Not on file  Tobacco Use   Smoking status: Never   Smokeless tobacco: Never  Vaping Use   Vaping status: Never Used  Substance and Sexual Activity   Alcohol use: Yes    Comment: Occas   Drug use: Yes    Types: Marijuana   Sexual activity: Yes    Birth control/protection: None  Other Topics Concern   Not on file  Social History Narrative   Not on file   Social Determinants of Health   Financial Resource Strain: Medium Risk (08/07/2022)   Overall Financial Resource Strain (CARDIA)    Difficulty of Paying Living Expenses: Somewhat hard  Food Insecurity: Food Insecurity Present (08/07/2022)   Hunger Vital Sign    Worried About Running Out of Food in the Last Year: Often true    Ran Out of Food in the Last Year: Often true  Transportation Needs: No Transportation Needs (08/07/2022)   PRAPARE - Administrator, Civil Service (  Medical): No    Lack of Transportation (Non-Medical): No  Physical Activity: Unknown (08/07/2022)   Exercise Vital Sign    Days of Exercise per Week: 0 days    Minutes of Exercise per Session: Not on file  Stress: No Stress Concern Present (08/07/2022)   [employer] of Occupational Health - Occupational Stress Questionnaire    Feeling of Stress : Only a little  Social Connections: Moderately Isolated (08/07/2022)   Social Connection and Isolation Panel [NHANES]    Frequency of Communication with Friends and Family: More than three times a week    Frequency of Social Gatherings with Friends and Family: Once a week    Attends Religious Services: Never    Database administrator or Organizations: Yes    Attends Museum/gallery exhibitions officer: More than 4 times per year    Marital Status: Never married    Review of Systems: A 12 point ROS discussed and pertinent positives are indicated in the HPI above.  All other systems are negative.  Review of Systems  Constitutional:  Negative for activity change, fatigue and fever.  Respiratory:  Negative for cough and shortness of breath.   Cardiovascular:  Negative for chest pain.  Gastrointestinal:  Negative for abdominal pain.  Neurological:  Negative for weakness.  Psychiatric/Behavioral:  Negative for behavioral problems and confusion.     Vital Signs: BP (!) 176/106   Pulse 86   Temp 98 F (36.7 C) (Oral)   Resp 18   Ht 6' (1.829 m)   Wt 265 lb (120.2 kg)   SpO2 97%   BMI 35.94 kg/m     Physical Exam Vitals reviewed.  HENT:     Mouth/Throat:     Mouth: Mucous membranes are moist.  Cardiovascular:     Rate and Rhythm: Normal rate and regular rhythm.     Heart sounds: Normal heart sounds.  Pulmonary:     Breath sounds: Normal breath sounds. No wheezing.  Abdominal:     Palpations: Abdomen is soft.     Tenderness: There is no abdominal tenderness.  Musculoskeletal:        General: Normal range of motion.     Right lower leg: No edema.     Left lower leg: No edema.  Skin:    General: Skin is warm.  Neurological:     Mental Status: He is alert and oriented to person, place, and time.  Psychiatric:        Behavior: Behavior normal.     Imaging: No results found.  Labs:  CBC: Recent Labs    11/08/22 0613  WBC 7.5  HGB 11.3*  HCT 35.6*  PLT 204    COAGS: No results for input(s): "INR", "APTT" in the last 8760 hours.  BMP: Recent Labs    05/23/22 1408  NA 139  K 5.0  CL 104  CO2 23  GLUCOSE 166*  BUN 31*  CALCIUM 8.7  CREATININE 2.14*    LIVER FUNCTION TESTS: Recent Labs    05/23/22 1408  BILITOT <0.2  AST 22  ALT 18  ALKPHOS 93  PROT 5.4*  ALBUMIN 2.9*    TUMOR MARKERS: No results for input(s):  "AFPTM", "CEA", "CA199", "CHROMGRNA" in the last 8760 hours.  Assessment and Plan:  Random renal biopsy Risks and benefits of random renal biopsy was discussed with the patient and/or patient's family including, but not limited to bleeding, infection, damage to adjacent structures or low yield requiring additional tests.  All of the questions were answered and there is agreement to proceed.  Consent signed and in chart.  Thank you for this interesting consult.  I greatly enjoyed meeting JOSEAN SEGOVIA and look forward to participating in their care.  A copy of this report was sent to the requesting provider on this date.  Electronically Signed: Robet Leu, PA-C 11/08/2022, 7:08 AM   I spent a total of  30 Minutes   in face to face in clinical consultation, greater than 50% of which was counseling/coordinating care for random renal biopsy

## 2022-11-08 NOTE — Procedures (Signed)
Pre Procedure Dx: Proteinuria of uncertain etiology Post Procedural Dx: Same  Technically successful US guided biopsy of inferior pole of the left kidney.  EBL: Trace No immediate complications.   Jay Watts, MD Pager #: [phone]   

## 2022-11-10 ENCOUNTER — Encounter (HOSPITAL_COMMUNITY): Payer: Self-pay

## 2022-11-10 LAB — SURGICAL PATHOLOGY

## 2022-11-27 ENCOUNTER — Other Ambulatory Visit: Payer: Self-pay | Admitting: Internal Medicine

## 2022-11-30 DIAGNOSIS — H401134 Primary open-angle glaucoma, bilateral, indeterminate stage: Secondary | ICD-10-CM | POA: Diagnosis not present

## 2022-11-30 DIAGNOSIS — Z794 Long term (current) use of insulin: Secondary | ICD-10-CM | POA: Diagnosis not present

## 2022-11-30 DIAGNOSIS — E113533 Type 2 diabetes mellitus with proliferative diabetic retinopathy with traction retinal detachment not involving the macula, bilateral: Secondary | ICD-10-CM | POA: Diagnosis not present

## 2022-12-08 ENCOUNTER — Ambulatory Visit: Payer: 59 | Admitting: Internal Medicine

## 2022-12-13 ENCOUNTER — Encounter: Payer: Self-pay | Admitting: Internal Medicine

## 2022-12-13 ENCOUNTER — Ambulatory Visit: Payer: 59 | Admitting: Internal Medicine

## 2022-12-13 VITALS — BP 136/80 | HR 107 | Ht 72.0 in | Wt 267.0 lb

## 2022-12-13 DIAGNOSIS — I5032 Chronic diastolic (congestive) heart failure: Secondary | ICD-10-CM

## 2022-12-13 DIAGNOSIS — Z794 Long term (current) use of insulin: Secondary | ICD-10-CM

## 2022-12-13 DIAGNOSIS — I10 Essential (primary) hypertension: Secondary | ICD-10-CM | POA: Diagnosis not present

## 2022-12-13 DIAGNOSIS — N2581 Secondary hyperparathyroidism of renal origin: Secondary | ICD-10-CM | POA: Diagnosis not present

## 2022-12-13 DIAGNOSIS — E782 Mixed hyperlipidemia: Secondary | ICD-10-CM

## 2022-12-13 DIAGNOSIS — E1140 Type 2 diabetes mellitus with diabetic neuropathy, unspecified: Secondary | ICD-10-CM | POA: Diagnosis not present

## 2022-12-13 DIAGNOSIS — N1832 Chronic kidney disease, stage 3b: Secondary | ICD-10-CM | POA: Diagnosis not present

## 2022-12-13 LAB — BAYER DCA HB A1C WAIVED: HB A1C (BAYER DCA - WAIVED): 7.1 % — ABNORMAL HIGH (ref 4.8–5.6)

## 2022-12-13 MED ORDER — TRULICITY 3 MG/0.5ML ~~LOC~~ SOAJ: 3.0000 mg | SUBCUTANEOUS | 3 refills | Status: DC

## 2022-12-13 NOTE — Progress Notes (Signed)
Established Patient Office Visit  Subjective:  Patient ID: Mike Bowen, male    DOB: 24-Feb-1981  Age: 42 y.o. MRN: [id number]  CC:  Chief Complaint  Patient presents with   Hypertension    Four month follow up    Diabetes    Four month follow up     HPI Mike Bowen is a 42 y.o. male with past medical history of HTN, type 2 DM, obesity and chronic LE swelling who presents for f/u of his chronic medical conditions.  HTN: His BP was wnl today.  He has started taking amlodipine-olmesartan 10-40 mg QD, chlorthalidone 25 mg QD and hydralazine 100 mg TID. He denies any dizziness, chest pain, dyspnea or palpitations currently.  Type II DM with obesity: He is on glipizide 10 mg twice daily and Trulicity 1.5 mg qw. He reports 1 episode of hypoglycemia - 70s. He states that he had changed his diet and had been trying to follow low-carb diet.  He denies any polyuria or polydipsia currently.  He was on Crestor as well. His blood glucose has been around 100-150 mostly.  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  He still has right-sided visual disturbance.  Past Medical History:  Diagnosis Date   Diabetes mellitus without complication (HCC)    Hypertension     Past Surgical History:  Procedure Laterality Date   SHOULDER SURGERY      Family History  Problem Relation Age of Onset   High blood pressure Mother    Diabetes Father    Diabetes Other     Social History   Socioeconomic History   Marital status: Single    Spouse name: Not on file   Number of children: Not on file   Years of education: Not on file   Highest education level: 12th grade  Occupational History   Not on file  Tobacco Use   Smoking status: Never   Smokeless tobacco: Never  Vaping Use   Vaping status: Never Used  Substance and Sexual Activity   Alcohol use: Yes    Comment: Occas   Drug use: Yes    Types: Marijuana    Sexual activity: Yes    Birth control/protection: None  Other Topics Concern   Not on file  Social History Narrative   Not on file   Social Determinants of Health   Financial Resource Strain: Medium Risk (08/07/2022)   Overall Financial Resource Strain (CARDIA)    Difficulty of Paying Living Expenses: Somewhat hard  Food Insecurity: Food Insecurity Present (08/07/2022)   Hunger Vital Sign    Worried About Running Out of Food in the Last Year: Often true    Ran Out of Food in the Last Year: Often true  Transportation Needs: No Transportation Needs (08/07/2022)   PRAPARE - Administrator, Civil Service (Medical): No    Lack of Transportation (Non-Medical): No  Physical Activity: Unknown (08/07/2022)   Exercise Vital Sign    Days of Exercise per Week: 0 days    Minutes of Exercise per Session: Not on file  Stress: No Stress Concern Present (08/07/2022)   [employer] of Occupational Health - Occupational Stress Questionnaire    Feeling of Stress : Only a little  Social Connections: Moderately Isolated (08/07/2022)   Social Connection and Isolation Panel [NHANES]    Frequency of Communication with Friends and Family: More than three times a week    Frequency of  Social Gatherings with Friends and Family: Once a week    Attends Religious Services: Never    Database administrator or Organizations: Yes    Attends Engineer, structural: More than 4 times per year    Marital Status: Never married  Intimate Partner Violence: Unknown (06/03/2021)   Received from [employer], [facility]   HITS    Physically Hurt: Not on file    Insult or Talk Down To: Not on file    Threaten Physical Harm: Not on file    Scream or Curse: Not on file    Outpatient Medications Prior to Visit  Medication Sig Dispense Refill   hydrALAZINE (APRESOLINE) 100 MG tablet Take 100 mg by mouth 3 (three) times daily.     ACCU-CHEK GUIDE test strip USE 1 STRIP TO CHECK GLUCOSE UP TO 4 TIMES  DAILY AS DIRECTED 100 each 0   amLODipine-olmesartan (AZOR) 10-40 MG tablet TAKE 1 TABLET BY MOUTH EVERY DAY 30 tablet 5   blood glucose meter kit and supplies KIT Dispense based on patient and insurance preference. Use up to four times daily as directed. 1 each 0   chlorthalidone (HYGROTON) 25 MG tablet TAKE 1 TABLET BY MOUTH EVERY DAY 30 tablet 5   glipiZIDE (GLUCOTROL) 10 MG tablet TAKE 1 TABLET BY MOUTH TWICE A DAY 180 tablet 1   Misc. Devices MISC Compression socks - 1 pair. 1 each 0   Misc. Devices MISC Blood pressure cuff. 1 each. 1 each 0   omeprazole (PRILOSEC) 20 MG capsule Take 1 capsule (20 mg total) by mouth daily. 30 capsule 5   rosuvastatin (CRESTOR) 10 MG tablet TAKE 1 TABLET BY MOUTH EVERY DAY 90 tablet 1   Dulaglutide (TRULICITY) 1.5 MG/0.5ML SOPN Inject 1.5 mg into the skin once a week. 6 mL 1   hydrALAZINE (APRESOLINE) 100 MG tablet Take 1 tablet (100 mg total) by mouth 2 (two) times daily. 180 tablet 1   0.9 %  sodium chloride infusion      No facility-administered medications prior to visit.    No Known Allergies  ROS Review of Systems  Constitutional:  Negative for chills and fever.  HENT:  Negative for congestion and sore throat.   Eyes:  Positive for visual disturbance. Negative for pain and discharge.  Respiratory:  Negative for cough and shortness of breath.   Cardiovascular:  Positive for leg swelling. Negative for chest pain and palpitations.  Gastrointestinal:  Negative for diarrhea, nausea and vomiting.  Endocrine: Negative for polydipsia and polyuria.  Genitourinary:  Negative for dysuria and hematuria.  Musculoskeletal:  Negative for neck pain and neck stiffness.  Skin:  Positive for rash.  Neurological:  Negative for dizziness, weakness, numbness and headaches.  Psychiatric/Behavioral:  Negative for agitation and behavioral problems.       Objective:    Physical Exam Vitals reviewed.  Constitutional:      General: He is not in acute  distress.    Appearance: He is not diaphoretic.  HENT:     Head: Normocephalic and atraumatic.     Nose: Nose normal.     Mouth/Throat:     Mouth: Mucous membranes are moist.  Eyes:     General: No scleral icterus.    Extraocular Movements: Extraocular movements intact.  Cardiovascular:     Rate and Rhythm: Normal rate and regular rhythm.     Heart sounds: Normal heart sounds. No murmur heard. Pulmonary:     Breath sounds: Normal  breath sounds. No wheezing or rales.  Musculoskeletal:     Cervical back: Neck supple. No tenderness.     Right lower leg: Edema (1+) present.     Left lower leg: Edema (1+) present.  Skin:    General: Skin is warm.     Findings: Rash (Stasis dermatitis bilaterally) present.  Neurological:     General: No focal deficit present.     Mental Status: He is alert and oriented to person, place, and time.     Sensory: No sensory deficit.     Motor: No weakness.  Psychiatric:        Mood and Affect: Mood normal.        Behavior: Behavior normal.     BP 136/80 (BP Location: Right Arm, Patient Position: Sitting, Cuff Size: Large)   Pulse (!) 107   Ht 6' (1.829 m)   Wt 267 lb (121.1 kg)   SpO2 97%   BMI 36.21 kg/m  Wt Readings from Last 3 Encounters:  12/13/22 267 lb (121.1 kg)  11/08/22 265 lb (120.2 kg)  08/08/22 263 lb 3.2 oz (119.4 kg)    No results found for: "TSH" Lab Results  Component Value Date   WBC 7.5 11/08/2022   HGB 11.3 (L) 11/08/2022   HCT 35.6 (L) 11/08/2022   MCV 78.9 (L) 11/08/2022   PLT 204 11/08/2022   Lab Results  Component Value Date   NA 139 05/23/2022   K 5.0 05/23/2022   CO2 23 05/23/2022   GLUCOSE 166 (H) 05/23/2022   BUN 31 (H) 05/23/2022   CREATININE 2.14 (H) 05/23/2022   BILITOT <0.2 05/23/2022   ALKPHOS 93 05/23/2022   AST 22 05/23/2022   ALT 18 05/23/2022   PROT 5.4 (L) 05/23/2022   ALBUMIN 2.9 (L) 05/23/2022   CALCIUM 8.7 05/23/2022   ANIONGAP 7 12/09/2020   EGFR 39 (L) 05/23/2022   Lab Results   Component Value Date   CHOL 218 (H) 05/23/2022   Lab Results  Component Value Date   HDL 47 05/23/2022   Lab Results  Component Value Date   LDLCALC 149 (H) 05/23/2022   Lab Results  Component Value Date   TRIG 124 05/23/2022   Lab Results  Component Value Date   CHOLHDL 4.6 05/23/2022   Lab Results  Component Value Date   HGBA1C 7.1 (H) 12/13/2022      Assessment & Plan:   Problem List Items Addressed This Visit       Cardiovascular and Mediastinum   Essential hypertension    BP Readings from Last 1 Encounters:  12/13/22 136/80   Well-controlled with amlodipine- olmesartan 10-40 mg once daily,  chlorthalidone 25 mg once daily and Hydralazine 100 mg TID Dose of hydralazine was increased by Dr. Wolfgang Phoenix to 100 mg 3 times daily Counseled for compliance with the medications Advised DASH diet and moderate exercise/walking, at least 150 mins/week      Relevant Medications   hydrALAZINE (APRESOLINE) 100 MG tablet   Chronic heart failure with preserved ejection fraction (HCC)    Echo (12/21) showed grade 1 diastolic dysfunction Has chronic LE swelling On chlorthalidone now      Relevant Medications   hydrALAZINE (APRESOLINE) 100 MG tablet     Endocrine   Type 2 diabetes mellitus with diabetic neuropathy, with long-term current use of insulin (HCC) - Primary    Lab Results  Component Value Date   HGBA1C 7.7 (A) 08/08/2022   Uncontrolled, but improving On Glipizide 10 mg  BID On Trulicity 1.5 mg qw- increase dose to 3 mg qw Was on insulin in the past Advised to follow diabetic diet On statin and  ARB Check CMP and lipid panel Diabetic eye exam: Advised to follow up with Ophthalmology for diabetic eye exam      Relevant Medications   Dulaglutide (TRULICITY) 3 MG/0.5ML SOPN   Other Relevant Orders   Bayer DCA Hb A1c Waived (Completed)   Secondary hyperparathyroidism (HCC)    Likely due to CKD Needs to continue with vitamin D supplement Followed by  nephrology        Genitourinary   Stage 3b chronic kidney disease (HCC)    Last CMP reviewed Has progressive CKD - likely in the setting of uncontrolled HTN and DM Needs to improve hydration Referred to nephrology - needs to continue to follow up        Other   Mixed hyperlipidemia    Checked lipid profile On Crestor, needs to be compliant      Relevant Medications   hydrALAZINE (APRESOLINE) 100 MG tablet   Other Relevant Orders   Lipid Profile   Morbid obesity (HCC)    BMI Readings from Last 3 Encounters:  12/13/22 36.21 kg/m  11/08/22 35.94 kg/m  08/08/22 35.70 kg/m   Associated with HTN, HLD, type 2 DM, CKD On GLP1 agonist for type 2 DM Advised to follow low carb diet and ambulate as tolerated      Relevant Medications   Dulaglutide (TRULICITY) 3 MG/0.5ML SOPN    Meds ordered this encounter  Medications   Dulaglutide (TRULICITY) 3 MG/0.5ML SOPN    Sig: Inject 3 mg as directed once a week.    Dispense:  2 mL    Refill:  3    DOSE CHANGE    Follow-up: Return in about 4 months (around 04/15/2023) for DM and HLD.    Anabel Halon, MD

## 2022-12-13 NOTE — Patient Instructions (Signed)
Please start taking Trulicity 3 mg once weekly after you complete your supply of 1.5 mg.  Please continue to take medications as prescribed.  Please continue to follow low carb diet and perform moderate exercise/walking at least 150 mins/week.

## 2022-12-13 NOTE — Assessment & Plan Note (Addendum)
BP Readings from Last 1 Encounters:  12/13/22 136/80   Well-controlled with amlodipine- olmesartan 10-40 mg once daily,  chlorthalidone 25 mg once daily and Hydralazine 100 mg TID Dose of hydralazine was increased by Dr. Wolfgang Phoenix to 100 mg 3 times daily Counseled for compliance with the medications Advised DASH diet and moderate exercise/walking, at least 150 mins/week

## 2022-12-13 NOTE — Assessment & Plan Note (Signed)
Echo (12/21) showed grade 1 diastolic dysfunction Has chronic LE swelling On chlorthalidone now

## 2022-12-13 NOTE — Assessment & Plan Note (Addendum)
Lab Results  Component Value Date   HGBA1C 7.7 (A) 08/08/2022   Uncontrolled, but improving On Glipizide 10 mg BID On Trulicity 1.5 mg qw- increase dose to 3 mg qw Was on insulin in the past Advised to follow diabetic diet On statin and  ARB Check CMP and lipid panel Diabetic eye exam: Advised to follow up with Ophthalmology for diabetic eye exam

## 2022-12-13 NOTE — Assessment & Plan Note (Signed)
Likely due to CKD Needs to continue with vitamin D supplement Followed by nephrology

## 2022-12-13 NOTE — Assessment & Plan Note (Signed)
Checked lipid profile On Crestor, needs to be compliant

## 2022-12-13 NOTE — Assessment & Plan Note (Addendum)
Last CMP reviewed Has progressive CKD - likely in the setting of uncontrolled HTN and DM Needs to improve hydration Referred to nephrology - needs to continue to follow up

## 2022-12-13 NOTE — Assessment & Plan Note (Signed)
BMI Readings from Last 3 Encounters:  12/13/22 36.21 kg/m  11/08/22 35.94 kg/m  08/08/22 35.70 kg/m   Associated with HTN, HLD, type 2 DM, CKD On GLP1 agonist for type 2 DM Advised to follow low carb diet and ambulate as tolerated

## 2022-12-22 DIAGNOSIS — N1832 Chronic kidney disease, stage 3b: Secondary | ICD-10-CM | POA: Diagnosis not present

## 2022-12-22 DIAGNOSIS — E782 Mixed hyperlipidemia: Secondary | ICD-10-CM | POA: Diagnosis not present

## 2022-12-22 DIAGNOSIS — R809 Proteinuria, unspecified: Secondary | ICD-10-CM | POA: Diagnosis not present

## 2022-12-24 LAB — LIPID PANEL
Chol/HDL Ratio: 4.6 {ratio} (ref 0.0–5.0)
Cholesterol, Total: 144 mg/dL (ref 100–199)
HDL: 31 mg/dL — ABNORMAL LOW (ref >39)
LDL Chol Calc (NIH): 75 mg/dL (ref 0–99)
Triglycerides: 232 mg/dL — ABNORMAL HIGH (ref 0–149)
VLDL Cholesterol Cal: 38 mg/dL (ref 5–40)

## 2022-12-25 DIAGNOSIS — H401124 Primary open-angle glaucoma, left eye, indeterminate stage: Secondary | ICD-10-CM | POA: Diagnosis not present

## 2022-12-25 DIAGNOSIS — H2512 Age-related nuclear cataract, left eye: Secondary | ICD-10-CM | POA: Diagnosis not present

## 2023-01-18 DIAGNOSIS — E113513 Type 2 diabetes mellitus with proliferative diabetic retinopathy with macular edema, bilateral: Secondary | ICD-10-CM | POA: Diagnosis not present

## 2023-01-18 DIAGNOSIS — Z794 Long term (current) use of insulin: Secondary | ICD-10-CM | POA: Diagnosis not present

## 2023-01-22 DIAGNOSIS — H401123 Primary open-angle glaucoma, left eye, severe stage: Secondary | ICD-10-CM | POA: Diagnosis not present

## 2023-02-24 ENCOUNTER — Other Ambulatory Visit: Payer: Self-pay | Admitting: Internal Medicine

## 2023-02-24 DIAGNOSIS — E782 Mixed hyperlipidemia: Secondary | ICD-10-CM

## 2023-02-24 DIAGNOSIS — I10 Essential (primary) hypertension: Secondary | ICD-10-CM

## 2023-03-27 DIAGNOSIS — E113513 Type 2 diabetes mellitus with proliferative diabetic retinopathy with macular edema, bilateral: Secondary | ICD-10-CM | POA: Diagnosis not present

## 2023-03-27 DIAGNOSIS — Z961 Presence of intraocular lens: Secondary | ICD-10-CM | POA: Diagnosis not present

## 2023-03-27 DIAGNOSIS — H35053 Retinal neovascularization, unspecified, bilateral: Secondary | ICD-10-CM | POA: Diagnosis not present

## 2023-03-27 DIAGNOSIS — H401130 Primary open-angle glaucoma, bilateral, stage unspecified: Secondary | ICD-10-CM | POA: Diagnosis not present

## 2023-03-27 DIAGNOSIS — Z794 Long term (current) use of insulin: Secondary | ICD-10-CM | POA: Diagnosis not present

## 2023-04-06 DIAGNOSIS — R809 Proteinuria, unspecified: Secondary | ICD-10-CM | POA: Diagnosis not present

## 2023-04-06 DIAGNOSIS — E211 Secondary hyperparathyroidism, not elsewhere classified: Secondary | ICD-10-CM | POA: Diagnosis not present

## 2023-04-06 DIAGNOSIS — N189 Chronic kidney disease, unspecified: Secondary | ICD-10-CM | POA: Diagnosis not present

## 2023-04-06 DIAGNOSIS — D631 Anemia in chronic kidney disease: Secondary | ICD-10-CM | POA: Diagnosis not present

## 2023-04-06 LAB — HEMOGLOBIN A1C: Hemoglobin A1C: 6.9

## 2023-04-06 LAB — CBC AND DIFFERENTIAL: Hemoglobin: 11.5 — AB (ref 13.5–17.5)

## 2023-04-06 LAB — MICROALBUMIN / CREATININE URINE RATIO
Creatinine, POC: 176.4 mg/dL
Microalb Creat Ratio: 2365

## 2023-04-06 LAB — BASIC METABOLIC PANEL: Creatinine: 2.8 — AB (ref 0.6–1.3)

## 2023-04-06 LAB — COMPREHENSIVE METABOLIC PANEL: eGFR: 27

## 2023-04-11 DIAGNOSIS — E1129 Type 2 diabetes mellitus with other diabetic kidney complication: Secondary | ICD-10-CM | POA: Diagnosis not present

## 2023-04-11 DIAGNOSIS — N1832 Chronic kidney disease, stage 3b: Secondary | ICD-10-CM | POA: Diagnosis not present

## 2023-04-11 DIAGNOSIS — N2581 Secondary hyperparathyroidism of renal origin: Secondary | ICD-10-CM | POA: Diagnosis not present

## 2023-04-11 DIAGNOSIS — I5032 Chronic diastolic (congestive) heart failure: Secondary | ICD-10-CM | POA: Diagnosis not present

## 2023-04-16 ENCOUNTER — Encounter: Payer: Self-pay | Admitting: Internal Medicine

## 2023-04-16 ENCOUNTER — Ambulatory Visit (INDEPENDENT_AMBULATORY_CARE_PROVIDER_SITE_OTHER): Payer: 59 | Admitting: Internal Medicine

## 2023-04-16 ENCOUNTER — Other Ambulatory Visit: Payer: Self-pay | Admitting: Internal Medicine

## 2023-04-16 VITALS — BP 132/83 | HR 82 | Ht 72.0 in | Wt 276.6 lb

## 2023-04-16 DIAGNOSIS — N1832 Chronic kidney disease, stage 3b: Secondary | ICD-10-CM | POA: Diagnosis not present

## 2023-04-16 DIAGNOSIS — E782 Mixed hyperlipidemia: Secondary | ICD-10-CM

## 2023-04-16 DIAGNOSIS — Z23 Encounter for immunization: Secondary | ICD-10-CM | POA: Diagnosis not present

## 2023-04-16 DIAGNOSIS — Z794 Long term (current) use of insulin: Secondary | ICD-10-CM | POA: Diagnosis not present

## 2023-04-16 DIAGNOSIS — E113533 Type 2 diabetes mellitus with proliferative diabetic retinopathy with traction retinal detachment not involving the macula, bilateral: Secondary | ICD-10-CM | POA: Diagnosis not present

## 2023-04-16 DIAGNOSIS — I10 Essential (primary) hypertension: Secondary | ICD-10-CM

## 2023-04-16 DIAGNOSIS — I5032 Chronic diastolic (congestive) heart failure: Secondary | ICD-10-CM

## 2023-04-16 DIAGNOSIS — N2581 Secondary hyperparathyroidism of renal origin: Secondary | ICD-10-CM

## 2023-04-16 DIAGNOSIS — E1140 Type 2 diabetes mellitus with diabetic neuropathy, unspecified: Secondary | ICD-10-CM | POA: Diagnosis not present

## 2023-04-16 MED ORDER — GLIPIZIDE 10 MG PO TABS: 10.0000 mg | ORAL_TABLET | Freq: Two times a day (BID) | ORAL | 1 refills | Status: DC

## 2023-04-16 MED ORDER — ATORVASTATIN CALCIUM 20 MG PO TABS: 20.0000 mg | ORAL_TABLET | Freq: Every day | ORAL | 1 refills | Status: DC

## 2023-04-16 MED ORDER — SEMAGLUTIDE (1 MG/DOSE) 4 MG/3ML ~~LOC~~ SOPN: 1.0000 mg | PEN_INJECTOR | SUBCUTANEOUS | 3 refills | Status: DC

## 2023-04-16 NOTE — Assessment & Plan Note (Signed)
 Last CMP reviewed Has progressive CKD - likely in the setting of uncontrolled HTN and DM Needs to improve hydration Referred to nephrology - needs to continue to follow up

## 2023-04-16 NOTE — Assessment & Plan Note (Signed)
 Echo (12/21) showed grade 1 diastolic dysfunction Has chronic LE swelling, has significantly improved now since he has left his previous job On chlorthalidone now

## 2023-04-16 NOTE — Progress Notes (Unsigned)
 Established Patient Office Visit  Subjective:  Patient ID: Mike Bowen, male    DOB: 22-Jan-1981  Age: 43 y.o. MRN: [id number]  CC:  Chief Complaint  Patient presents with   Care Management    4 month f/u , pt reports constipation concerns due to trulicity. Pt would like to discuss weight.     HPI Mike Bowen is a 43 y.o. male with past medical history of HTN, type 2 DM, obesity and chronic LE swelling who presents for f/u of his chronic medical conditions.  HTN: His BP was wnl today.  He has started taking amlodipine-olmesartan 10-40 mg QD, chlorthalidone 25 mg QD and hydralazine 100 mg TID. He denies any dizziness, chest pain, dyspnea or palpitations currently.  Type II DM with obesity: He is on glipizide 10 mg twice daily and Trulicity 3 mg qw. He reports no episode of hypoglycemia recently. He states that he had changed his diet and had been trying to follow low-carb diet.  He is still not losing weight.  He denies any polyuria or polydipsia currently.  He is on Crestor as well. His blood glucose has been around 100-150 mostly.  CKD and proteinuria: He has significant proteinuria, likely due to diabetic nephropathy.  He is on ARB currently.  Followed by Dr. Wolfgang Phoenix.  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  He still has right-sided visual disturbance.  Past Medical History:  Diagnosis Date   Diabetes mellitus without complication (HCC)    Hypertension     Past Surgical History:  Procedure Laterality Date   SHOULDER SURGERY      Family History  Problem Relation Age of Onset   High blood pressure Mother    Diabetes Father    Diabetes Other     Social History   Socioeconomic History   Marital status: Single    Spouse name: Not on file   Number of children: Not on file   Years of education: Not on file   Highest education level: 12th grade  Occupational History   Not on file   Tobacco Use   Smoking status: Never   Smokeless tobacco: Never  Vaping Use   Vaping status: Never Used  Substance and Sexual Activity   Alcohol use: Yes    Comment: Occas   Drug use: Yes    Types: Marijuana   Sexual activity: Yes    Birth control/protection: None  Other Topics Concern   Not on file  Social History Narrative   Not on file   Social Drivers of Health   Financial Resource Strain: High Risk (04/12/2023)   Overall Financial Resource Strain (CARDIA)    Difficulty of Paying Living Expenses: Hard  Food Insecurity: Food Insecurity Present (04/12/2023)   Hunger Vital Sign    Worried About Running Out of Food in the Last Year: Sometimes true    Ran Out of Food in the Last Year: Sometimes true  Transportation Needs: No Transportation Needs (04/12/2023)   PRAPARE - Administrator, Civil Service (Medical): No    Lack of Transportation (Non-Medical): No  Physical Activity: Unknown (04/12/2023)   Exercise Vital Sign    Days of Exercise per Week: 0 days    Minutes of Exercise per Session: Not on file  Stress: Stress Concern Present (04/12/2023)   [employer] of Occupational Health - Occupational Stress Questionnaire    Feeling of Stress : To some extent  Social Connections: Moderately  Integrated (04/12/2023)   Social Connection and Isolation Panel [NHANES]    Frequency of Communication with Friends and Family: More than three times a week    Frequency of Social Gatherings with Friends and Family: More than three times a week    Attends Religious Services: 1 to 4 times per year    Active Member of [employer] or Organizations: No    Attends Engineer, structural: More than 4 times per year    Marital Status: Never married  Intimate Partner Violence: Unknown (06/03/2021)   Received from [employer], [facility]   HITS    Physically Hurt: Not on file    Insult or Talk Down To: Not on file    Threaten Physical Harm: Not on file    Scream or Curse: Not on  file    Outpatient Medications Prior to Visit  Medication Sig Dispense Refill   ACCU-CHEK GUIDE test strip USE 1 STRIP TO CHECK GLUCOSE UP TO 4 TIMES DAILY AS DIRECTED 100 each 0   amLODipine-olmesartan (AZOR) 10-40 MG tablet TAKE 1 TABLET BY MOUTH EVERY DAY 30 tablet 5   blood glucose meter kit and supplies KIT Dispense based on patient and insurance preference. Use up to four times daily as directed. 1 each 0   chlorthalidone (HYGROTON) 25 MG tablet TAKE 1 TABLET BY MOUTH EVERY DAY 30 tablet 5   hydrALAZINE (APRESOLINE) 100 MG tablet Take 100 mg by mouth 3 (three) times daily.     Misc. Devices MISC Blood pressure cuff. 1 each. 1 each 0   Dulaglutide (TRULICITY) 3 MG/0.5ML SOPN Inject 3 mg as directed once a week. 2 mL 3   glipiZIDE (GLUCOTROL) 10 MG tablet TAKE 1 TABLET BY MOUTH TWICE A DAY 180 tablet 1   omeprazole (PRILOSEC) 20 MG capsule Take 1 capsule (20 mg total) by mouth daily. 30 capsule 5   rosuvastatin (CRESTOR) 10 MG tablet TAKE 1 TABLET BY MOUTH EVERY DAY 30 tablet 5   Misc. Devices MISC Compression socks - 1 pair. 1 each 0   No facility-administered medications prior to visit.    No Known Allergies  ROS Review of Systems  Constitutional:  Negative for chills and fever.  HENT:  Negative for congestion and sore throat.   Eyes:  Positive for visual disturbance. Negative for pain and discharge.  Respiratory:  Negative for cough and shortness of breath.   Cardiovascular:  Positive for leg swelling. Negative for chest pain and palpitations.  Gastrointestinal:  Negative for diarrhea, nausea and vomiting.  Endocrine: Negative for polydipsia and polyuria.  Genitourinary:  Negative for dysuria and hematuria.  Musculoskeletal:  Negative for neck pain and neck stiffness.  Skin:  Positive for rash.  Neurological:  Negative for dizziness, weakness, numbness and headaches.  Psychiatric/Behavioral:  Negative for agitation and behavioral problems.       Objective:    Physical  Exam Vitals reviewed.  Constitutional:      General: He is not in acute distress.    Appearance: He is not diaphoretic.  HENT:     Head: Normocephalic and atraumatic.     Nose: Nose normal.     Mouth/Throat:     Mouth: Mucous membranes are moist.  Eyes:     General: No scleral icterus.    Extraocular Movements: Extraocular movements intact.  Cardiovascular:     Rate and Rhythm: Normal rate and regular rhythm.     Heart sounds: Normal heart sounds. No murmur heard. Pulmonary:  Breath sounds: Normal breath sounds. No wheezing or rales.  Musculoskeletal:     Cervical back: Neck supple. No tenderness.     Right lower leg: Edema (1+) present.     Left lower leg: Edema (1+) present.  Skin:    General: Skin is warm.     Findings: Rash (Stasis dermatitis bilaterally) present.  Neurological:     General: No focal deficit present.     Mental Status: He is alert and oriented to person, place, and time.     Sensory: No sensory deficit.     Motor: No weakness.  Psychiatric:        Mood and Affect: Mood normal.        Behavior: Behavior normal.     BP 132/83   Pulse 82   Ht 6' (1.829 m)   Wt 276 lb 9.6 oz (125.5 kg)   SpO2 97%   BMI 37.51 kg/m  Wt Readings from Last 3 Encounters:  04/16/23 276 lb 9.6 oz (125.5 kg)  12/13/22 267 lb (121.1 kg)  11/08/22 265 lb (120.2 kg)    No results found for: "TSH" Lab Results  Component Value Date   WBC 7.5 11/08/2022   HGB 11.5 (A) 04/06/2023   HCT 35.6 (L) 11/08/2022   MCV 78.9 (L) 11/08/2022   PLT 204 11/08/2022   Lab Results  Component Value Date   NA 139 05/23/2022   K 5.0 05/23/2022   CO2 23 05/23/2022   GLUCOSE 166 (H) 05/23/2022   BUN 31 (H) 05/23/2022   CREATININE 2.8 (A) 04/06/2023   BILITOT <0.2 05/23/2022   ALKPHOS 93 05/23/2022   AST 22 05/23/2022   ALT 18 05/23/2022   PROT 5.4 (L) 05/23/2022   ALBUMIN 2.9 (L) 05/23/2022   CALCIUM 8.7 05/23/2022   ANIONGAP 7 12/09/2020   EGFR 27 04/06/2023   Lab Results   Component Value Date   CHOL 144 12/22/2022   Lab Results  Component Value Date   HDL 31 (L) 12/22/2022   Lab Results  Component Value Date   LDLCALC 75 12/22/2022   Lab Results  Component Value Date   TRIG 232 (H) 12/22/2022   Lab Results  Component Value Date   CHOLHDL 4.6 12/22/2022   Lab Results  Component Value Date   HGBA1C 6.9 04/06/2023      Assessment & Plan:   Problem List Items Addressed This Visit       Cardiovascular and Mediastinum   Essential hypertension   BP Readings from Last 1 Encounters:  04/16/23 132/83   Well-controlled with amlodipine- olmesartan 10-40 mg once daily,  chlorthalidone 25 mg once daily and Hydralazine 100 mg TID Counseled for compliance with the medications Advised DASH diet and moderate exercise/walking, at least 150 mins/week      Relevant Medications   atorvastatin (LIPITOR) 20 MG tablet   Chronic heart failure with preserved ejection fraction (HCC)   Echo (12/21) showed grade 1 diastolic dysfunction Has chronic LE swelling, has significantly improved now since he has left his previous job On chlorthalidone now      Relevant Medications   atorvastatin (LIPITOR) 20 MG tablet     Endocrine   Type 2 diabetes mellitus with diabetic neuropathy, with long-term current use of insulin (HCC) - Primary   Lab Results  Component Value Date   HGBA1C 7.1 (H) 12/13/2022   Uncontrolled, but improving On Glipizide 10 mg BID On Trulicity 3 mg qw -switched to Ozempic 1 mg QW for better  glycemic response and weight loss benefit, would also be beneficial for his CHF Was on insulin in the past Advised to follow diabetic diet On statin and  ARB Check CMP and lipid panel Diabetic eye exam: Advised to follow up with Ophthalmology for diabetic eye exam      Relevant Medications   Semaglutide, 1 MG/DOSE, 4 MG/3ML SOPN   glipiZIDE (GLUCOTROL) 10 MG tablet   atorvastatin (LIPITOR) 20 MG tablet   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, associated with type 2 diabetes mellitus (HCC)   Followed by ophthalmology - has limited vision affecting his functional capacity, undergoing disability process      Relevant Medications   Semaglutide, 1 MG/DOSE, 4 MG/3ML SOPN   glipiZIDE (GLUCOTROL) 10 MG tablet   atorvastatin (LIPITOR) 20 MG tablet   Secondary hyperparathyroidism (HCC)   Likely due to CKD Needs to continue with vitamin D supplement Followed by nephrology        Genitourinary   Stage 3b chronic kidney disease (HCC)   Last CMP reviewed Has progressive CKD - likely in the setting of uncontrolled HTN and DM Needs to improve hydration Followed by nephrology -last visit note reviewed, needs to continue to follow up        Other   Mixed hyperlipidemia   Checked lipid profile On Crestor, changed to atorvastatin due to significant proteinuria (CKD)      Relevant Medications   atorvastatin (LIPITOR) 20 MG tablet   Other Visit Diagnoses       Encounter for immunization       Relevant Orders   Pneumococcal conjugate vaccine 20-valent (Completed)        Meds ordered this encounter  Medications   Semaglutide, 1 MG/DOSE, 4 MG/3ML SOPN    Sig: Inject 1 mg as directed once a week.    Dispense:  3 mL    Refill:  3    DISCONTINUE TRULICITY   glipiZIDE (GLUCOTROL) 10 MG tablet    Sig: Take 1 tablet (10 mg total) by mouth 2 (two) times daily.    Dispense:  180 tablet    Refill:  1   atorvastatin (LIPITOR) 20 MG tablet    Sig: Take 1 tablet (20 mg total) by mouth daily.    Dispense:  90 tablet    Refill:  1    DISCONTINUE CRESTOR    Follow-up: Return in about 4 months (around 08/14/2023) for DM and CKD.    Anabel Halon, MD

## 2023-04-16 NOTE — Assessment & Plan Note (Signed)
 Followed by ophthalmology.

## 2023-04-16 NOTE — Assessment & Plan Note (Signed)
 BP Readings from Last 1 Encounters:  04/16/23 132/83   Well-controlled with amlodipine- olmesartan 10-40 mg once daily,  chlorthalidone 25 mg once daily and Hydralazine 100 mg TID Counseled for compliance with the medications Advised DASH diet and moderate exercise/walking, at least 150 mins/week

## 2023-04-16 NOTE — Patient Instructions (Signed)
 Please complete current supply of Trulicity 3 mg once weekly, and then switch to Ozempic 1 mg once weekly.  Please stop taking Rosuvastatin and start taking Atorvastatin instead of it.  Please continue to take medications as prescribed.  Please continue to follow low carb diet and perform moderate exercise/walking at least 150 mins/week.

## 2023-04-16 NOTE — Assessment & Plan Note (Signed)
 Lab Results  Component Value Date   HGBA1C 7.1 (H) 12/13/2022   Uncontrolled, but improving On Glipizide 10 mg BID On Trulicity 3 mg qw Was on insulin in the past Advised to follow diabetic diet On statin and  ARB Check CMP and lipid panel Diabetic eye exam: Advised to follow up with Ophthalmology for diabetic eye exam

## 2023-04-16 NOTE — Assessment & Plan Note (Signed)
Likely due to CKD Needs to continue with vitamin D supplement Followed by nephrology

## 2023-04-19 NOTE — Assessment & Plan Note (Addendum)
 Checked lipid profile On Crestor, changed to atorvastatin due to significant proteinuria (CKD)

## 2023-04-25 DIAGNOSIS — E785 Hyperlipidemia, unspecified: Secondary | ICD-10-CM | POA: Diagnosis not present

## 2023-04-25 DIAGNOSIS — N2581 Secondary hyperparathyroidism of renal origin: Secondary | ICD-10-CM | POA: Diagnosis not present

## 2023-04-25 DIAGNOSIS — Z7985 Long-term (current) use of injectable non-insulin antidiabetic drugs: Secondary | ICD-10-CM | POA: Diagnosis not present

## 2023-04-25 DIAGNOSIS — Z8249 Family history of ischemic heart disease and other diseases of the circulatory system: Secondary | ICD-10-CM | POA: Diagnosis not present

## 2023-04-25 DIAGNOSIS — E1140 Type 2 diabetes mellitus with diabetic neuropathy, unspecified: Secondary | ICD-10-CM | POA: Diagnosis not present

## 2023-04-25 DIAGNOSIS — I129 Hypertensive chronic kidney disease with stage 1 through stage 4 chronic kidney disease, or unspecified chronic kidney disease: Secondary | ICD-10-CM | POA: Diagnosis not present

## 2023-04-25 DIAGNOSIS — Z809 Family history of malignant neoplasm, unspecified: Secondary | ICD-10-CM | POA: Diagnosis not present

## 2023-04-25 DIAGNOSIS — H409 Unspecified glaucoma: Secondary | ICD-10-CM | POA: Diagnosis not present

## 2023-04-25 DIAGNOSIS — H547 Unspecified visual loss: Secondary | ICD-10-CM | POA: Diagnosis not present

## 2023-04-25 DIAGNOSIS — N184 Chronic kidney disease, stage 4 (severe): Secondary | ICD-10-CM | POA: Diagnosis not present

## 2023-04-25 DIAGNOSIS — Z833 Family history of diabetes mellitus: Secondary | ICD-10-CM | POA: Diagnosis not present

## 2023-04-25 DIAGNOSIS — E1122 Type 2 diabetes mellitus with diabetic chronic kidney disease: Secondary | ICD-10-CM | POA: Diagnosis not present

## 2023-05-22 DIAGNOSIS — Z794 Long term (current) use of insulin: Secondary | ICD-10-CM | POA: Diagnosis not present

## 2023-05-22 DIAGNOSIS — E113513 Type 2 diabetes mellitus with proliferative diabetic retinopathy with macular edema, bilateral: Secondary | ICD-10-CM | POA: Diagnosis not present

## 2023-05-23 DIAGNOSIS — Z9883 Filtering (vitreous) bleb after glaucoma surgery status: Secondary | ICD-10-CM | POA: Diagnosis not present

## 2023-05-23 DIAGNOSIS — H401124 Primary open-angle glaucoma, left eye, indeterminate stage: Secondary | ICD-10-CM | POA: Diagnosis not present

## 2023-05-23 DIAGNOSIS — T85398A Other mechanical complication of other ocular prosthetic devices, implants and grafts, initial encounter: Secondary | ICD-10-CM | POA: Diagnosis not present

## 2023-05-28 ENCOUNTER — Other Ambulatory Visit: Payer: Self-pay | Admitting: Internal Medicine

## 2023-05-28 DIAGNOSIS — I10 Essential (primary) hypertension: Secondary | ICD-10-CM

## 2023-05-29 ENCOUNTER — Other Ambulatory Visit (HOSPITAL_COMMUNITY): Payer: Self-pay

## 2023-05-29 ENCOUNTER — Telehealth: Payer: Self-pay | Admitting: Pharmacy Technician

## 2023-05-29 NOTE — Telephone Encounter (Signed)
 Pharmacy Patient Advocate Encounter  Received notification from [facility] that Prior Authorization for Trulicity 3MG /0.5ML auto-injectors has been APPROVED from 05/29/2023 to 05/28/2024. Ran test claim, Copay is $953.73. This test claim was processed through [facility]- copay amounts may vary at other pharmacies due to pharmacy/plan contracts, or as the patient moves through the different stages of their insurance plan.   PA #/Case ID/Reference #: [id number]

## 2023-05-29 NOTE — Telephone Encounter (Signed)
 Pharmacy Patient Advocate Encounter   Received notification from CoverMyMeds that prior authorization for Trulicity 3MG /0.5ML auto-injectors is required/requested.   Insurance verification completed.   The patient is insured through [facility] .   Per test claim: PA required; PA submitted to above mentioned insurance via CoverMyMeds Key/confirmation #/EOC [id number] Status is pending

## 2023-06-21 DIAGNOSIS — E113513 Type 2 diabetes mellitus with proliferative diabetic retinopathy with macular edema, bilateral: Secondary | ICD-10-CM | POA: Diagnosis not present

## 2023-06-21 DIAGNOSIS — Z7984 Long term (current) use of oral hypoglycemic drugs: Secondary | ICD-10-CM | POA: Diagnosis not present

## 2023-06-21 DIAGNOSIS — E113533 Type 2 diabetes mellitus with proliferative diabetic retinopathy with traction retinal detachment not involving the macula, bilateral: Secondary | ICD-10-CM | POA: Diagnosis not present

## 2023-06-21 DIAGNOSIS — Z7985 Long-term (current) use of injectable non-insulin antidiabetic drugs: Secondary | ICD-10-CM | POA: Diagnosis not present

## 2023-06-21 DIAGNOSIS — Z794 Long term (current) use of insulin: Secondary | ICD-10-CM | POA: Diagnosis not present

## 2023-08-15 ENCOUNTER — Ambulatory Visit (INDEPENDENT_AMBULATORY_CARE_PROVIDER_SITE_OTHER): Payer: 59 | Admitting: Internal Medicine

## 2023-08-15 ENCOUNTER — Encounter: Payer: Self-pay | Admitting: Internal Medicine

## 2023-08-15 VITALS — BP 138/78 | HR 80 | Ht 72.0 in | Wt 281.4 lb

## 2023-08-15 DIAGNOSIS — I10 Essential (primary) hypertension: Secondary | ICD-10-CM

## 2023-08-15 DIAGNOSIS — L84 Corns and callosities: Secondary | ICD-10-CM | POA: Insufficient documentation

## 2023-08-15 DIAGNOSIS — N1832 Chronic kidney disease, stage 3b: Secondary | ICD-10-CM

## 2023-08-15 DIAGNOSIS — Z794 Long term (current) use of insulin: Secondary | ICD-10-CM

## 2023-08-15 DIAGNOSIS — E782 Mixed hyperlipidemia: Secondary | ICD-10-CM | POA: Diagnosis not present

## 2023-08-15 DIAGNOSIS — M7021 Olecranon bursitis, right elbow: Secondary | ICD-10-CM | POA: Insufficient documentation

## 2023-08-15 DIAGNOSIS — E1140 Type 2 diabetes mellitus with diabetic neuropathy, unspecified: Secondary | ICD-10-CM

## 2023-08-15 MED ORDER — AMLODIPINE-OLMESARTAN 10-40 MG PO TABS: 1.0000 | ORAL_TABLET | Freq: Every day | ORAL | 5 refills | Status: AC

## 2023-08-15 MED ORDER — CHLORTHALIDONE 25 MG PO TABS: 25.0000 mg | ORAL_TABLET | Freq: Every day | ORAL | 5 refills | Status: DC

## 2023-08-15 MED ORDER — TRULICITY 4.5 MG/0.5ML ~~LOC~~ SOAJ
4.5000 mg | SUBCUTANEOUS | 1 refills | Status: AC
Start: 2023-08-15 — End: ?

## 2023-08-15 MED ORDER — GLIPIZIDE 10 MG PO TABS: 10.0000 mg | ORAL_TABLET | Freq: Two times a day (BID) | ORAL | 1 refills | Status: AC

## 2023-08-15 MED ORDER — ATORVASTATIN CALCIUM 20 MG PO TABS
20.0000 mg | ORAL_TABLET | Freq: Every day | ORAL | 1 refills | Status: DC
Start: 2023-08-15 — End: 2023-12-18

## 2023-08-15 NOTE — Progress Notes (Signed)
 Established Patient Office Visit  Subjective:  Patient ID: Mike Bowen, male    DOB: Nov 10, 1980  Age: 43 y.o. MRN: [id number]  CC:  Chief Complaint  Patient presents with   Medical Management of Chronic Issues    4 month f/u   Joint Swelling    Pt reports right elbow pain ongoing for about 1 month.     HPI Mike Bowen is a 43 y.o. male with past medical history of HTN, type 2 DM, obesity and chronic LE swelling who presents for f/u of his chronic medical conditions.  HTN: His BP was wnl today.  He has been taking amlodipine -olmesartan  10-40 mg QD, chlorthalidone  25 mg QD and hydralazine  100 mg TID. He denies any dizziness, chest pain, dyspnea or palpitations currently.  Type II DM with obesity: He is on glipizide  10 mg twice daily and Trulicity  3 mg qw. He reports no episode of hypoglycemia recently. He states that he has changed his diet and had been trying to follow low-carb diet.  He is still not losing weight. Ozempic  was prescribed for DM, but was not covered by his insurance. He denies any polyuria or polydipsia currently.  He is on Crestor  as well. His blood glucose has been around 100-150 mostly, with few readings above 200 recently.  CKD and proteinuria: He has significant proteinuria, likely due to diabetic nephropathy.  He is on ARB currently.  Followed by Dr. Carrolyn Clan.  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  He still has right-sided visual disturbance.  He reports right elbow swelling about a month ago, which has improved now.  Denied any pain.  Denied any injury.  Past Medical History:  Diagnosis Date   Diabetes mellitus without complication (HCC)    Hypertension     Past Surgical History:  Procedure Laterality Date   SHOULDER SURGERY      Family History  Problem Relation Age of Onset   High blood pressure Mother    Diabetes Father    Diabetes Other     Social History    Socioeconomic History   Marital status: Single    Spouse name: Not on file   Number of children: Not on file   Years of education: Not on file   Highest education level: 12th grade  Occupational History   Not on file  Tobacco Use   Smoking status: Never   Smokeless tobacco: Never  Vaping Use   Vaping status: Never Used  Substance and Sexual Activity   Alcohol use: Yes    Comment: Occas   Drug use: Yes    Types: Marijuana   Sexual activity: Yes    Birth control/protection: None  Other Topics Concern   Not on file  Social History Narrative   Not on file   Social Drivers of Health   Financial Resource Strain: High Risk (04/12/2023)   Overall Financial Resource Strain (CARDIA)    Difficulty of Paying Living Expenses: Hard  Food Insecurity: Food Insecurity Present (04/12/2023)   Hunger Vital Sign    Worried About Running Out of Food in the Last Year: Sometimes true    Ran Out of Food in the Last Year: Sometimes true  Transportation Needs: No Transportation Needs (04/12/2023)   PRAPARE - Administrator, Civil Service (Medical): No    Lack of Transportation (Non-Medical): No  Physical Activity: Unknown (04/12/2023)   Exercise Vital Sign    Days of Exercise per  Week: 0 days    Minutes of Exercise per Session: Not on file  Stress: Stress Concern Present (04/12/2023)   [employer] of Occupational Health - Occupational Stress Questionnaire    Feeling of Stress : To some extent  Social Connections: Moderately Integrated (04/12/2023)   Social Connection and Isolation Panel    Frequency of Communication with Friends and Family: More than three times a week    Frequency of Social Gatherings with Friends and Family: More than three times a week    Attends Religious Services: 1 to 4 times per year    Active Member of [employer] or Organizations: No    Attends Engineer, structural: More than 4 times per year    Marital Status: Never married  Intimate Partner  Violence: Unknown (06/03/2021)   Received from [facility]   HITS    Physically Hurt: Not on file    Insult or Talk Down To: Not on file    Threaten Physical Harm: Not on file    Scream or Curse: Not on file    Outpatient Medications Prior to Visit  Medication Sig Dispense Refill   ACCU-CHEK GUIDE test strip USE 1 STRIP TO CHECK GLUCOSE UP TO 4 TIMES DAILY AS DIRECTED 100 each 0   blood glucose meter kit and supplies KIT Dispense based on patient and insurance preference. Use up to four times daily as directed. 1 each 0   hydrALAZINE  (APRESOLINE ) 100 MG tablet Take 100 mg by mouth 3 (three) times daily.     Misc. Devices MISC Blood pressure cuff. 1 each. 1 each 0   amLODipine -olmesartan  (AZOR ) 10-40 MG tablet TAKE 1 TABLET BY MOUTH EVERY DAY 30 tablet 5   atorvastatin  (LIPITOR) 20 MG tablet Take 1 tablet (20 mg total) by mouth daily. 90 tablet 1   chlorthalidone  (HYGROTON ) 25 MG tablet TAKE 1 TABLET BY MOUTH EVERY DAY 30 tablet 5   glipiZIDE  (GLUCOTROL ) 10 MG tablet Take 1 tablet (10 mg total) by mouth 2 (two) times daily. 180 tablet 1   Semaglutide , 1 MG/DOSE, 4 MG/3ML SOPN Inject 1 mg as directed once a week. 3 mL 3   TRULICITY  3 MG/0.5ML SOAJ SMARTSIG:3 Milligram(s) Once a Week     No facility-administered medications prior to visit.    No Known Allergies  ROS Review of Systems  Constitutional:  Negative for chills and fever.  HENT:  Negative for congestion and sore throat.   Eyes:  Positive for visual disturbance. Negative for pain and discharge.  Respiratory:  Negative for cough and shortness of breath.   Cardiovascular:  Positive for leg swelling. Negative for chest pain and palpitations.  Gastrointestinal:  Negative for diarrhea, nausea and vomiting.  Endocrine: Negative for polydipsia and polyuria.  Genitourinary:  Negative for dysuria and hematuria.  Musculoskeletal:  Negative for neck pain and neck stiffness.  Skin:  Positive for rash.  Neurological:  Negative for  dizziness and weakness.  Psychiatric/Behavioral:  Negative for agitation and behavioral problems.       Objective:    Physical Exam Vitals reviewed.  Constitutional:      General: He is not in acute distress.    Appearance: He is not diaphoretic.  HENT:     Head: Normocephalic and atraumatic.     Nose: Nose normal.     Mouth/Throat:     Mouth: Mucous membranes are moist.   Eyes:     General: No scleral icterus.    Extraocular Movements: Extraocular movements intact.  Cardiovascular:     Rate and Rhythm: Normal rate and regular rhythm.     Heart sounds: Normal heart sounds. No murmur heard. Pulmonary:     Breath sounds: Normal breath sounds. No wheezing or rales.   Musculoskeletal:     Right elbow: Swelling (Mild, improved compared to prior) present.     Cervical back: Neck supple. No tenderness.     Right lower leg: Edema (1+) present.     Left lower leg: Edema (1+) present.   Skin:    General: Skin is warm.     Findings: Rash (Stasis dermatitis bilaterally) present.   Neurological:     General: No focal deficit present.     Mental Status: He is alert and oriented to person, place, and time.     Sensory: No sensory deficit.     Motor: No weakness.   Psychiatric:        Mood and Affect: Mood normal.        Behavior: Behavior normal.     BP 138/78   Pulse 80   Ht 6' (1.829 m)   Wt 281 lb 6.4 oz (127.6 kg)   SpO2 96%   BMI 38.16 kg/m  Wt Readings from Last 3 Encounters:  08/15/23 281 lb 6.4 oz (127.6 kg)  04/16/23 276 lb 9.6 oz (125.5 kg)  12/13/22 267 lb (121.1 kg)    No results found for: TSH Lab Results  Component Value Date   WBC 7.5 11/08/2022   HGB 11.5 (A) 04/06/2023   HCT 35.6 (L) 11/08/2022   MCV 78.9 (L) 11/08/2022   PLT 204 11/08/2022   Lab Results  Component Value Date   NA 139 05/23/2022   K 5.0 05/23/2022   CO2 23 05/23/2022   GLUCOSE 166 (H) 05/23/2022   BUN 31 (H) 05/23/2022   CREATININE 2.8 (A) 04/06/2023   BILITOT  <0.2 05/23/2022   ALKPHOS 93 05/23/2022   AST 22 05/23/2022   ALT 18 05/23/2022   PROT 5.4 (L) 05/23/2022   ALBUMIN 2.9 (L) 05/23/2022   CALCIUM  8.7 05/23/2022   ANIONGAP 7 12/09/2020   EGFR 27 04/06/2023   Lab Results  Component Value Date   CHOL 144 12/22/2022   Lab Results  Component Value Date   HDL 31 (L) 12/22/2022   Lab Results  Component Value Date   LDLCALC 75 12/22/2022   Lab Results  Component Value Date   TRIG 232 (H) 12/22/2022   Lab Results  Component Value Date   CHOLHDL 4.6 12/22/2022   Lab Results  Component Value Date   HGBA1C 6.9 04/06/2023      Assessment & Plan:   Problem List Items Addressed This Visit       Cardiovascular and Mediastinum   Essential hypertension   BP Readings from Last 1 Encounters:  08/15/23 138/78   Well-controlled with amlodipine - olmesartan  10-40 mg once daily,  chlorthalidone  25 mg once daily and Hydralazine  100 mg TID Counseled for compliance with the medications Advised DASH diet and moderate exercise/walking, at least 150 mins/week      Relevant Medications   amLODipine -olmesartan  (AZOR ) 10-40 MG tablet   atorvastatin  (LIPITOR) 20 MG tablet   chlorthalidone  (HYGROTON ) 25 MG tablet     Endocrine   Type 2 diabetes mellitus with diabetic neuropathy, with long-term current use of insulin  (HCC) - Primary   Lab Results  Component Value Date   HGBA1C 6.9 04/06/2023   Well-controlled, but has been having blood glucose in 200s recently  On Glipizide  10 mg BID and Trulicity  3 mg qw - had tried to switch to Ozempic  1 mg QW for better results with CHF, glycemic response and weight loss benefit, but could not get it due to insurance coverage concern  Increased dose of Trulicity  to 4.5 mg qw for better glycemic control Was on insulin  in the past Advised to follow diabetic diet On statin and  ARB Check CMP and lipid panel Diabetic eye exam: Advised to follow up with Ophthalmology for diabetic eye exam       Relevant Medications   Dulaglutide  (TRULICITY ) 4.5 MG/0.5ML SOAJ   amLODipine -olmesartan  (AZOR ) 10-40 MG tablet   atorvastatin  (LIPITOR) 20 MG tablet   glipiZIDE  (GLUCOTROL ) 10 MG tablet   Other Relevant Orders   Ambulatory referral to Podiatry   Hemoglobin A1c     Musculoskeletal and Integument   Olecranon bursitis of right elbow   Right elbow swelling, improved now - likely olecranon bursitis Advised to apply cool compresses       Callus of foot   Has callus on right foot Has history of diabetic foot ulcer in the past Referred to podiatry      Relevant Orders   Ambulatory referral to Podiatry     Genitourinary   Stage 3b chronic kidney disease (HCC)   Last CMP reviewed Has progressive CKD - likely in the setting of uncontrolled HTN and DM Needs to improve hydration Followed by nephrology -last visit note reviewed, needs to continue to follow up        Other   Mixed hyperlipidemia   Check lipid profile On atorvastatin  20 mg QD, needs to be compliant      Relevant Medications   amLODipine -olmesartan  (AZOR ) 10-40 MG tablet   atorvastatin  (LIPITOR) 20 MG tablet   chlorthalidone  (HYGROTON ) 25 MG tablet   Other Relevant Orders   Lipid Profile      Meds ordered this encounter  Medications   Dulaglutide  (TRULICITY ) 4.5 MG/0.5ML SOAJ    Sig: Inject 4.5 mg as directed once a week.    Dispense:  6 mL    Refill:  1   amLODipine -olmesartan  (AZOR ) 10-40 MG tablet    Sig: Take 1 tablet by mouth daily.    Dispense:  30 tablet    Refill:  5   atorvastatin  (LIPITOR) 20 MG tablet    Sig: Take 1 tablet (20 mg total) by mouth daily.    Dispense:  90 tablet    Refill:  1    DISCONTINUE CRESTOR    chlorthalidone  (HYGROTON ) 25 MG tablet    Sig: Take 1 tablet (25 mg total) by mouth daily.    Dispense:  30 tablet    Refill:  5   glipiZIDE  (GLUCOTROL ) 10 MG tablet    Sig: Take 1 tablet (10 mg total) by mouth 2 (two) times daily.    Dispense:  180 tablet    Refill:  1     Follow-up: Return in about 4 months (around 12/15/2023) for DM and HTN.    Meldon Sport, MD

## 2023-08-15 NOTE — Assessment & Plan Note (Signed)
 Right elbow swelling, improved now - likely olecranon bursitis Advised to apply cool compresses

## 2023-08-15 NOTE — Assessment & Plan Note (Signed)
 Has callus on right foot Has history of diabetic foot ulcer in the past Referred to podiatry

## 2023-08-15 NOTE — Assessment & Plan Note (Signed)
 Last CMP reviewed Has progressive CKD - likely in the setting of uncontrolled HTN and DM Needs to improve hydration Referred to nephrology - needs to continue to follow up

## 2023-08-15 NOTE — Addendum Note (Signed)
 Addended byCleola Dach on: 08/15/2023 01:48 PM   Modules accepted: Orders

## 2023-08-15 NOTE — Patient Instructions (Addendum)
 Please start taking Trulicity  4.5 mg once weekly once you complete 3 mg doses.  Please continue to take medications as prescribed.  Please continue to follow low carb diet and perform moderate exercise/walking at least 150 mins/week.  Please get fasting blood tests done in the next month with your other blood tests from Nephrologist.

## 2023-08-15 NOTE — Assessment & Plan Note (Signed)
 BP Readings from Last 1 Encounters:  08/15/23 138/78   Well-controlled with amlodipine - olmesartan  10-40 mg once daily,  chlorthalidone  25 mg once daily and Hydralazine  100 mg TID Counseled for compliance with the medications Advised DASH diet and moderate exercise/walking, at least 150 mins/week

## 2023-08-15 NOTE — Assessment & Plan Note (Addendum)
 Lab Results  Component Value Date   HGBA1C 6.9 04/06/2023   Well-controlled, but has been having blood glucose in 200s recently On Glipizide  10 mg BID and Trulicity  3 mg qw - had tried to switch to Ozempic  1 mg QW for better results with CHF, glycemic response and weight loss benefit, but could not get it due to insurance coverage concern  Increased dose of Trulicity  to 4.5 mg qw for better glycemic control Was on insulin  in the past Advised to follow diabetic diet On statin and  ARB Check CMP and lipid panel Diabetic eye exam: Advised to follow up with Ophthalmology for diabetic eye exam

## 2023-08-15 NOTE — Assessment & Plan Note (Signed)
 Check lipid profile On atorvastatin  20 mg QD, needs to be compliant

## 2023-08-28 DIAGNOSIS — E113513 Type 2 diabetes mellitus with proliferative diabetic retinopathy with macular edema, bilateral: Secondary | ICD-10-CM | POA: Diagnosis not present

## 2023-08-28 DIAGNOSIS — Z794 Long term (current) use of insulin: Secondary | ICD-10-CM | POA: Diagnosis not present

## 2023-08-30 DIAGNOSIS — N189 Chronic kidney disease, unspecified: Secondary | ICD-10-CM | POA: Diagnosis not present

## 2023-08-30 DIAGNOSIS — R809 Proteinuria, unspecified: Secondary | ICD-10-CM | POA: Diagnosis not present

## 2023-08-30 DIAGNOSIS — D631 Anemia in chronic kidney disease: Secondary | ICD-10-CM | POA: Diagnosis not present

## 2023-09-11 DIAGNOSIS — H401134 Primary open-angle glaucoma, bilateral, indeterminate stage: Secondary | ICD-10-CM | POA: Diagnosis not present

## 2023-10-02 ENCOUNTER — Other Ambulatory Visit (HOSPITAL_COMMUNITY): Payer: Self-pay

## 2023-10-02 ENCOUNTER — Telehealth: Payer: Self-pay | Admitting: Pharmacy Technician

## 2023-10-02 NOTE — Telephone Encounter (Signed)
 Pharmacy Patient Advocate Encounter   Received notification from CoverMyMeds that prior authorization for Trulcity 4.5mg /0.87ml auto-injectors is required/requested.   Insurance verification completed.   The patient is insured through [employer] .   Per test claim: PA required; PA submitted to above mentioned insurance via LATENT Key/confirmation #/EOC [id number] Status is pending

## 2023-10-03 ENCOUNTER — Other Ambulatory Visit (HOSPITAL_COMMUNITY): Payer: Self-pay

## 2023-10-03 NOTE — Telephone Encounter (Signed)
 Pharmacy Patient Advocate Encounter  Received notification from [facility] that Prior Authorization for Trulicity  4.5MG /0.5ML auto-injectors  has been APPROVED from 10/03/2023 to 10/02/2024.   PA #/Case ID/Reference #: [id number]    Refill too soon. Refill payable on or after 10/15/2023.

## 2023-11-09 ENCOUNTER — Telehealth: Payer: Self-pay | Admitting: Internal Medicine

## 2023-11-09 NOTE — Telephone Encounter (Signed)
 PCS Noted Copied Scanned Original in provider box Copy at front desk

## 2023-12-18 ENCOUNTER — Ambulatory Visit: Admitting: Internal Medicine

## 2023-12-18 ENCOUNTER — Encounter: Payer: Self-pay | Admitting: Internal Medicine

## 2023-12-18 VITALS — BP 138/76 | HR 88 | Ht 72.0 in | Wt 275.4 lb

## 2023-12-18 DIAGNOSIS — E1140 Type 2 diabetes mellitus with diabetic neuropathy, unspecified: Secondary | ICD-10-CM

## 2023-12-18 DIAGNOSIS — I5032 Chronic diastolic (congestive) heart failure: Secondary | ICD-10-CM | POA: Diagnosis not present

## 2023-12-18 DIAGNOSIS — E782 Mixed hyperlipidemia: Secondary | ICD-10-CM | POA: Diagnosis not present

## 2023-12-18 DIAGNOSIS — Z0001 Encounter for general adult medical examination with abnormal findings: Secondary | ICD-10-CM

## 2023-12-18 DIAGNOSIS — N1832 Chronic kidney disease, stage 3b: Secondary | ICD-10-CM | POA: Diagnosis not present

## 2023-12-18 DIAGNOSIS — Z7985 Long-term (current) use of injectable non-insulin antidiabetic drugs: Secondary | ICD-10-CM

## 2023-12-18 DIAGNOSIS — I10 Essential (primary) hypertension: Secondary | ICD-10-CM | POA: Diagnosis not present

## 2023-12-18 DIAGNOSIS — H401134 Primary open-angle glaucoma, bilateral, indeterminate stage: Secondary | ICD-10-CM | POA: Diagnosis not present

## 2023-12-18 LAB — HM DIABETES EYE EXAM

## 2023-12-18 MED ORDER — ATORVASTATIN CALCIUM 20 MG PO TABS: 20.0000 mg | ORAL_TABLET | Freq: Every day | ORAL | 1 refills | Status: AC

## 2023-12-18 NOTE — Patient Instructions (Signed)
 Please continue to take medications as prescribed.  Please continue to follow low carb diet and perform moderate exercise/walking as tolerated.

## 2023-12-18 NOTE — Assessment & Plan Note (Signed)
 Last CMP reviewed Has progressive CKD - likely in the setting of uncontrolled HTN and DM Needs to improve hydration Referred to nephrology - needs to continue to follow up

## 2023-12-18 NOTE — Assessment & Plan Note (Signed)
 Lab Results  Component Value Date   HGBA1C 6.9 04/06/2023   Well-controlled, but has been having blood glucose in 200s recently On Glipizide  10 mg BID and Trulicity  4.5 mg qw  Was on insulin  in the past Advised to follow diabetic diet On statin and  ARB Check CMP and lipid panel Diabetic eye exam: Advised to follow up with Ophthalmology for diabetic eye exam

## 2023-12-18 NOTE — Progress Notes (Unsigned)
 Established Patient Office Visit  Subjective:  Patient ID: Mike Bowen, male    DOB: [birth year]  Age: 43 y.o. MRN: [id number]  CC:  Chief Complaint  Patient presents with   Diabetes   Hypertension    HPI Mike Bowen is a 43 y.o. male with past medical history of HTN, type 2 DM, obesity and chronic LE swelling who presents for f/u of his chronic medical conditions.  HTN: His BP was wnl today.  He has been taking amlodipine -olmesartan  10-40 mg QD, chlorthalidone  25 mg QD and hydralazine  100 mg TID. He denies any dizziness, chest pain, dyspnea or palpitations currently.  Type II DM with obesity: He is on glipizide  10 mg twice daily and Trulicity  3 mg qw. He reports no episode of hypoglycemia recently. He states that he has changed his diet and had been trying to follow low-carb diet.  He is still not losing weight. Ozempic  was prescribed for DM, but was not covered by his insurance. He denies any polyuria or polydipsia currently.  He is on Crestor  as well. His blood glucose has been around 100-150 mostly, with few readings above 200 recently.  CKD and proteinuria: He has significant proteinuria, likely due to diabetic nephropathy.  He is on ARB currently.  Followed by Dr. Rachele.  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  He still has right-sided visual disturbance.  He reports right elbow swelling about a month ago, which has improved now.  Denied any pain.  Denied any injury.  Past Medical History:  Diagnosis Date   Diabetes mellitus without complication (HCC)    Hypertension     Past Surgical History:  Procedure Laterality Date   SHOULDER SURGERY      Family History  Problem Relation Age of Onset   High blood pressure Mother    Diabetes Father    Diabetes Other     Social History   Socioeconomic History   Marital status: Single    Spouse name: Not on file   Number of children: Not on  file   Years of education: Not on file   Highest education level: 12th grade  Occupational History   Not on file  Tobacco Use   Smoking status: Never   Smokeless tobacco: Never  Vaping Use   Vaping status: Never Used  Substance and Sexual Activity   Alcohol use: Yes    Comment: Occas   Drug use: Yes    Types: Marijuana   Sexual activity: Yes    Birth control/protection: None  Other Topics Concern   Not on file  Social History Narrative   Not on file   Social Drivers of Health   Financial Resource Strain: High Risk (04/12/2023)   Overall Financial Resource Strain (CARDIA)    Difficulty of Paying Living Expenses: Hard  Food Insecurity: Food Insecurity Present (04/12/2023)   Hunger Vital Sign    Worried About Running Out of Food in the Last Year: Sometimes true    Ran Out of Food in the Last Year: Sometimes true  Transportation Needs: No Transportation Needs (04/12/2023)   PRAPARE - Administrator, Civil Service (Medical): No    Lack of Transportation (Non-Medical): No  Physical Activity: Unknown (04/12/2023)   Exercise Vital Sign    Days of Exercise per Week: 0 days    Minutes of Exercise per Session: Not on file  Stress: Stress Concern Present (04/12/2023)   [employer] of  Occupational Health - Occupational Stress Questionnaire    Feeling of Stress : To some extent  Social Connections: Moderately Integrated (04/12/2023)   Social Connection and Isolation Panel    Frequency of Communication with Friends and Family: More than three times a week    Frequency of Social Gatherings with Friends and Family: More than three times a week    Attends Religious Services: 1 to 4 times per year    Active Member of [employer] or Organizations: No    Attends Engineer, structural: More than 4 times per year    Marital Status: Never married  Intimate Partner Violence: Unknown (06/03/2021)   Received from [facility]   HITS    Physically Hurt: Not on file    Insult or  Talk Down To: Not on file    Threaten Physical Harm: Not on file    Scream or Curse: Not on file    Outpatient Medications Prior to Visit  Medication Sig Dispense Refill   ACCU-CHEK GUIDE test strip USE 1 STRIP TO CHECK GLUCOSE UP TO 4 TIMES DAILY AS DIRECTED 100 each 0   amLODipine -olmesartan  (AZOR ) 10-40 MG tablet Take 1 tablet by mouth daily. 30 tablet 5   atorvastatin  (LIPITOR) 20 MG tablet Take 1 tablet (20 mg total) by mouth daily. 90 tablet 1   blood glucose meter kit and supplies KIT Dispense based on patient and insurance preference. Use up to four times daily as directed. 1 each 0   chlorthalidone  (HYGROTON ) 25 MG tablet Take 1 tablet (25 mg total) by mouth daily. 30 tablet 5   Dulaglutide  (TRULICITY ) 4.5 MG/0.5ML SOAJ Inject 4.5 mg as directed once a week. 6 mL 1   glipiZIDE  (GLUCOTROL ) 10 MG tablet Take 1 tablet (10 mg total) by mouth 2 (two) times daily. 180 tablet 1   hydrALAZINE  (APRESOLINE ) 100 MG tablet Take 100 mg by mouth 3 (three) times daily.     Misc. Devices MISC Blood pressure cuff. 1 each. 1 each 0   No facility-administered medications prior to visit.    No Known Allergies  ROS Review of Systems  Constitutional:  Negative for chills and fever.  HENT:  Negative for congestion and sore throat.   Eyes:  Positive for visual disturbance. Negative for pain and discharge.  Respiratory:  Negative for cough and shortness of breath.   Cardiovascular:  Positive for leg swelling. Negative for chest pain and palpitations.  Gastrointestinal:  Negative for diarrhea, nausea and vomiting.  Endocrine: Negative for polydipsia and polyuria.  Genitourinary:  Negative for dysuria and hematuria.  Musculoskeletal:  Negative for neck pain and neck stiffness.  Skin:  Positive for rash.  Neurological:  Negative for dizziness and weakness.  Psychiatric/Behavioral:  Negative for agitation and behavioral problems.       Objective:    Physical Exam Vitals reviewed.   Constitutional:      General: He is not in acute distress.    Appearance: He is not diaphoretic.  HENT:     Head: Normocephalic and atraumatic.     Nose: Nose normal.     Mouth/Throat:     Mouth: Mucous membranes are moist.  Eyes:     General: No scleral icterus.    Extraocular Movements: Extraocular movements intact.  Cardiovascular:     Rate and Rhythm: Normal rate and regular rhythm.     Heart sounds: Normal heart sounds. No murmur heard. Pulmonary:     Breath sounds: Normal breath sounds. No wheezing or  rales.  Musculoskeletal:     Right elbow: Swelling (Mild, improved compared to prior) present.     Cervical back: Neck supple. No tenderness.     Right lower leg: Edema (1+) present.     Left lower leg: Edema (1+) present.  Skin:    General: Skin is warm.     Findings: Rash (Stasis dermatitis bilaterally) present.  Neurological:     General: No focal deficit present.     Mental Status: He is alert and oriented to person, place, and time.     Sensory: No sensory deficit.     Motor: No weakness.  Psychiatric:        Mood and Affect: Mood normal.        Behavior: Behavior normal.     BP (!) 140/80 (BP Location: Left Arm)   Pulse 88   Ht 6' (1.829 m)   Wt 275 lb 6.4 oz (124.9 kg)   SpO2 99%   BMI 37.35 kg/m  Wt Readings from Last 3 Encounters:  12/18/23 275 lb 6.4 oz (124.9 kg)  08/15/23 281 lb 6.4 oz (127.6 kg)  04/16/23 276 lb 9.6 oz (125.5 kg)    No results found for: TSH Lab Results  Component Value Date   WBC 7.5 11/08/2022   HGB 11.5 (A) 04/06/2023   HCT 35.6 (L) 11/08/2022   MCV 78.9 (L) 11/08/2022   PLT 204 11/08/2022   Lab Results  Component Value Date   NA 139 05/23/2022   K 5.0 05/23/2022   CO2 23 05/23/2022   GLUCOSE 166 (H) 05/23/2022   BUN 31 (H) 05/23/2022   CREATININE 2.8 (A) 04/06/2023   BILITOT <0.2 05/23/2022   ALKPHOS 93 05/23/2022   AST 22 05/23/2022   ALT 18 05/23/2022   PROT 5.4 (L) 05/23/2022   ALBUMIN 2.9 (L) 05/23/2022    CALCIUM  8.7 05/23/2022   ANIONGAP 7 12/09/2020   EGFR 27 04/06/2023   Lab Results  Component Value Date   CHOL 144 12/22/2022   Lab Results  Component Value Date   HDL 31 (L) 12/22/2022   Lab Results  Component Value Date   LDLCALC 75 12/22/2022   Lab Results  Component Value Date   TRIG 232 (H) 12/22/2022   Lab Results  Component Value Date   CHOLHDL 4.6 12/22/2022   Lab Results  Component Value Date   HGBA1C 6.9 04/06/2023      Assessment & Plan:   Problem List Items Addressed This Visit   None      No orders of the defined types were placed in this encounter.   Follow-up: No follow-ups on file.    Suzzane MARLA Blanch, MD

## 2023-12-19 ENCOUNTER — Ambulatory Visit: Payer: Self-pay | Admitting: Internal Medicine

## 2023-12-19 ENCOUNTER — Telehealth: Payer: Self-pay | Admitting: Internal Medicine

## 2023-12-19 LAB — CBC WITH DIFFERENTIAL/PLATELET
Basophils Absolute: 0 x10E3/uL (ref 0.0–0.2)
Basos: 0 %
EOS (ABSOLUTE): 0.1 x10E3/uL (ref 0.0–0.4)
Eos: 1 %
Hematocrit: 35.7 % — ABNORMAL LOW (ref 37.5–51.0)
Hemoglobin: 11.3 g/dL — ABNORMAL LOW (ref 13.0–17.7)
Immature Grans (Abs): 0 x10E3/uL (ref 0.0–0.1)
Immature Granulocytes: 0 %
Lymphocytes Absolute: 1.2 x10E3/uL (ref 0.7–3.1)
Lymphs: 17 %
MCH: 25.6 pg — ABNORMAL LOW (ref 26.6–33.0)
MCHC: 31.7 g/dL (ref 31.5–35.7)
MCV: 81 fL (ref 79–97)
Monocytes Absolute: 0.5 x10E3/uL (ref 0.1–0.9)
Monocytes: 7 %
Neutrophils Absolute: 5.3 x10E3/uL (ref 1.4–7.0)
Neutrophils: 75 %
Platelets: 165 x10E3/uL (ref 150–450)
RBC: 4.41 x10E6/uL (ref 4.14–5.80)
RDW: 13.4 % (ref 11.6–15.4)
WBC: 7.2 x10E3/uL (ref 3.4–10.8)

## 2023-12-19 LAB — CMP14+EGFR
ALT: 11 IU/L (ref 0–44)
AST: 21 IU/L (ref 0–40)
Albumin: 2.4 g/dL — ABNORMAL LOW (ref 4.1–5.1)
Alkaline Phosphatase: 82 IU/L (ref 47–123)
BUN/Creatinine Ratio: 8 — ABNORMAL LOW (ref 9–20)
BUN: 31 mg/dL — ABNORMAL HIGH (ref 6–24)
Bilirubin Total: <0.2 mg/dL (ref 0.0–1.2)
CO2: 23 mmol/L (ref 20–29)
Calcium: 7.9 mg/dL — ABNORMAL LOW (ref 8.7–10.2)
Chloride: 106 mmol/L (ref 96–106)
Creatinine, Ser: 3.66 mg/dL — ABNORMAL HIGH (ref 0.76–1.27)
Globulin, Total: 2.3 g/dL (ref 1.5–4.5)
Glucose: 111 mg/dL — ABNORMAL HIGH (ref 70–99)
Potassium: 4.3 mmol/L (ref 3.5–5.2)
Sodium: 139 mmol/L (ref 134–144)
Total Protein: 4.7 g/dL — ABNORMAL LOW (ref 6.0–8.5)
eGFR: 20 mL/min/1.73 — ABNORMAL LOW (ref >59)

## 2023-12-19 LAB — LIPID PANEL
Chol/HDL Ratio: 7.3 ratio — ABNORMAL HIGH (ref 0.0–5.0)
Cholesterol, Total: 262 mg/dL — ABNORMAL HIGH (ref 100–199)
HDL: 36 mg/dL — ABNORMAL LOW (ref >39)
LDL Chol Calc (NIH): 197 mg/dL — ABNORMAL HIGH (ref 0–99)
Triglycerides: 156 mg/dL — ABNORMAL HIGH (ref 0–149)
VLDL Cholesterol Cal: 29 mg/dL (ref 5–40)

## 2023-12-19 LAB — HEMOGLOBIN A1C
Est. average glucose Bld gHb Est-mCnc: 154 mg/dL
Hgb A1c MFr Bld: 7 % — ABNORMAL HIGH (ref 4.8–5.6)

## 2023-12-19 NOTE — Telephone Encounter (Signed)
 PCS forms Noted Copied Sleeved Original placed provider box Copy placed front desk folder

## 2023-12-21 ENCOUNTER — Telehealth: Payer: Self-pay

## 2023-12-21 DIAGNOSIS — Z0001 Encounter for general adult medical examination with abnormal findings: Secondary | ICD-10-CM | POA: Insufficient documentation

## 2023-12-21 NOTE — Assessment & Plan Note (Signed)
 Physical exam as documented. Counseling done  re healthy lifestyle involving commitment to 150 minutes exercise per week, heart healthy diet, and attaining healthy weight.The importance of adequate sleep also discussed. Immunization and cancer screening needs are specifically addressed at this visit.

## 2023-12-21 NOTE — Assessment & Plan Note (Signed)
 BP Readings from Last 1 Encounters:  12/18/23 138/76   Well-controlled with amlodipine - olmesartan  10-40 mg once daily,  chlorthalidone  25 mg once daily and Hydralazine  100 mg TID Counseled for compliance with the medications Advised DASH diet and moderate exercise/walking, at least 150 mins/week

## 2023-12-21 NOTE — Telephone Encounter (Signed)
 Form completed , returned fax to [phone], copy made sent to scan, copy will be at my desk if  needed.

## 2023-12-21 NOTE — Telephone Encounter (Signed)
 Copied from CRM [id number]. Topic: Clinical - Lab/Test Results >> Dec 21, 2023  3:22 PM Shanda MATSU wrote: Reason for CRM: Mike Bowen w/Dr. Lewie office (kidney and hypertension specialist) CB: [phone] called in req for lab results for CMP14+EGFR be faxed to them at [phone], patient has an appt scheduled for 12/27/2023 w/Dr/ Marlowe

## 2023-12-21 NOTE — Assessment & Plan Note (Signed)
 Echo (12/21) showed grade 1 diastolic dysfunction Has chronic LE swelling, has significantly improved now since he has left his previous job On chlorthalidone now

## 2023-12-21 NOTE — Assessment & Plan Note (Addendum)
 Check lipid profile On atorvastatin  20 mg QD, has not had refills from pharmacy, refills sent again - needs to be compliant

## 2023-12-24 NOTE — Telephone Encounter (Signed)
 Lab faxed on 12/24/23

## 2023-12-27 ENCOUNTER — Other Ambulatory Visit (HOSPITAL_COMMUNITY): Payer: Self-pay | Admitting: Nephrology

## 2023-12-27 DIAGNOSIS — R809 Proteinuria, unspecified: Secondary | ICD-10-CM

## 2023-12-27 DIAGNOSIS — N1832 Chronic kidney disease, stage 3b: Secondary | ICD-10-CM | POA: Diagnosis not present

## 2023-12-27 DIAGNOSIS — E1129 Type 2 diabetes mellitus with other diabetic kidney complication: Secondary | ICD-10-CM | POA: Diagnosis not present

## 2023-12-27 DIAGNOSIS — N179 Acute kidney failure, unspecified: Secondary | ICD-10-CM | POA: Diagnosis not present

## 2024-01-15 DIAGNOSIS — E1140 Type 2 diabetes mellitus with diabetic neuropathy, unspecified: Secondary | ICD-10-CM | POA: Diagnosis not present

## 2024-01-17 DIAGNOSIS — E1140 Type 2 diabetes mellitus with diabetic neuropathy, unspecified: Secondary | ICD-10-CM | POA: Diagnosis not present

## 2024-01-21 DIAGNOSIS — E1140 Type 2 diabetes mellitus with diabetic neuropathy, unspecified: Secondary | ICD-10-CM | POA: Diagnosis not present

## 2024-01-22 DIAGNOSIS — E1140 Type 2 diabetes mellitus with diabetic neuropathy, unspecified: Secondary | ICD-10-CM | POA: Diagnosis not present

## 2024-01-23 DIAGNOSIS — E1140 Type 2 diabetes mellitus with diabetic neuropathy, unspecified: Secondary | ICD-10-CM | POA: Diagnosis not present

## 2024-01-28 DIAGNOSIS — E1140 Type 2 diabetes mellitus with diabetic neuropathy, unspecified: Secondary | ICD-10-CM | POA: Diagnosis not present

## 2024-01-29 DIAGNOSIS — E1140 Type 2 diabetes mellitus with diabetic neuropathy, unspecified: Secondary | ICD-10-CM | POA: Diagnosis not present

## 2024-01-30 DIAGNOSIS — E1140 Type 2 diabetes mellitus with diabetic neuropathy, unspecified: Secondary | ICD-10-CM | POA: Diagnosis not present

## 2024-01-31 DIAGNOSIS — E1140 Type 2 diabetes mellitus with diabetic neuropathy, unspecified: Secondary | ICD-10-CM | POA: Diagnosis not present

## 2024-02-01 DIAGNOSIS — E1140 Type 2 diabetes mellitus with diabetic neuropathy, unspecified: Secondary | ICD-10-CM | POA: Diagnosis not present

## 2024-02-04 DIAGNOSIS — E1140 Type 2 diabetes mellitus with diabetic neuropathy, unspecified: Secondary | ICD-10-CM | POA: Diagnosis not present

## 2024-02-05 DIAGNOSIS — E1140 Type 2 diabetes mellitus with diabetic neuropathy, unspecified: Secondary | ICD-10-CM | POA: Diagnosis not present

## 2024-02-06 DIAGNOSIS — E1140 Type 2 diabetes mellitus with diabetic neuropathy, unspecified: Secondary | ICD-10-CM | POA: Diagnosis not present

## 2024-02-10 ENCOUNTER — Emergency Department (HOSPITAL_COMMUNITY)
Admission: EM | Admit: 2024-02-10 | Discharge: 2024-02-11 | Disposition: A | Attending: Emergency Medicine | Admitting: Emergency Medicine

## 2024-02-10 ENCOUNTER — Other Ambulatory Visit: Payer: Self-pay

## 2024-02-10 ENCOUNTER — Encounter (HOSPITAL_COMMUNITY): Payer: Self-pay | Admitting: Emergency Medicine

## 2024-02-10 DIAGNOSIS — K029 Dental caries, unspecified: Secondary | ICD-10-CM | POA: Diagnosis not present

## 2024-02-10 DIAGNOSIS — I129 Hypertensive chronic kidney disease with stage 1 through stage 4 chronic kidney disease, or unspecified chronic kidney disease: Secondary | ICD-10-CM | POA: Diagnosis not present

## 2024-02-10 DIAGNOSIS — R519 Headache, unspecified: Secondary | ICD-10-CM | POA: Diagnosis not present

## 2024-02-10 DIAGNOSIS — E1122 Type 2 diabetes mellitus with diabetic chronic kidney disease: Secondary | ICD-10-CM | POA: Diagnosis not present

## 2024-02-10 DIAGNOSIS — N189 Chronic kidney disease, unspecified: Secondary | ICD-10-CM | POA: Diagnosis not present

## 2024-02-10 DIAGNOSIS — R220 Localized swelling, mass and lump, head: Secondary | ICD-10-CM | POA: Diagnosis present

## 2024-02-10 DIAGNOSIS — K011 Impacted teeth: Secondary | ICD-10-CM | POA: Diagnosis not present

## 2024-02-10 NOTE — ED Triage Notes (Signed)
 Pt c/o right facial swelling and pain since last night.

## 2024-02-11 MED ORDER — NAPROXEN 250 MG PO TABS
500.0000 mg | ORAL_TABLET | Freq: Once | ORAL | Status: DC
  Filled 2024-02-11: qty 2

## 2024-02-11 MED ORDER — AMOXICILLIN 500 MG PO CAPS: 500.0000 mg | ORAL_CAPSULE | Freq: Three times a day (TID) | ORAL | 0 refills | Status: AC

## 2024-02-11 MED ORDER — AMOXICILLIN 250 MG PO CAPS
500.0000 mg | ORAL_CAPSULE | Freq: Once | ORAL | Status: AC
  Administered 2024-02-11: 01:00:00 500 mg via ORAL
  Filled 2024-02-11: qty 2

## 2024-02-11 MED ORDER — HYDROCODONE-ACETAMINOPHEN 5-325 MG PO TABS: 1.0000 | ORAL_TABLET | ORAL | 0 refills | Status: AC | PRN

## 2024-02-11 MED ORDER — HYDRALAZINE HCL 25 MG PO TABS
100.0000 mg | ORAL_TABLET | Freq: Once | ORAL | Status: AC
  Administered 2024-02-11: 01:00:00 100 mg via ORAL
  Filled 2024-02-11: qty 4

## 2024-02-11 MED ORDER — NAPROXEN 500 MG PO TABS: 500.0000 mg | ORAL_TABLET | Freq: Two times a day (BID) | ORAL | 0 refills | Status: DC

## 2024-02-11 MED ORDER — ACETAMINOPHEN 500 MG PO TABS
1000.0000 mg | ORAL_TABLET | Freq: Once | ORAL | Status: AC
  Administered 2024-02-11: 01:00:00 1000 mg via ORAL
  Filled 2024-02-11: qty 2

## 2024-02-11 NOTE — Discharge Instructions (Addendum)
 You were evaluated in the Emergency Department and after careful evaluation, we did not find any emergent condition requiring admission or further testing in the hospital.  Your exam/testing today is overall reassuring.  Your symptoms are most likely related to your wisdom teeth.  They are impacted and may need to be removed.  Recommend use of the amoxicillin  antibiotic as directed for the next week.  Use the tylenol  1000mg  every 6-8 hours as needed for pain.  Can use the Norco tablets for more severe pain but use caution as this medication can cause drowsiness.  Follow-up with a dentist.  Please return to the Emergency Department if you experience any worsening of your condition.   Thank you for allowing us  to be a part of your care.

## 2024-02-11 NOTE — ED Provider Notes (Signed)
 [facility]-EMERGENCY DEPT [facility] Emergency Department Provider Note MRN:  [id number]  Arrival date & time: 02/11/2024     Chief Complaint   Facial Swelling   History of Present Illness   Mike Bowen is a 43 y.o. year-old male with a history of hypertension, diabetes, CKD presenting to the ED with chief complaint of facial swelling.  Pain and sensation of swelling to the right side of the face.  Worsening today.  Pain when he bites down.  No chest pain or shortness of breath, no fever, no other complaints.  Review of Systems  A thorough review of systems was obtained and all systems are negative except as noted in the HPI and PMH.   Patient's Health History    Past Medical History:  Diagnosis Date   Diabetes mellitus without complication (HCC)    Hypertension     Past Surgical History:  Procedure Laterality Date   SHOULDER SURGERY      Family History  Problem Relation Age of Onset   High blood pressure Mother    Diabetes Father    Diabetes Other     Social History   Socioeconomic History   Marital status: Single    Spouse name: Not on file   Number of children: Not on file   Years of education: Not on file   Highest education level: 12th grade  Occupational History   Not on file  Tobacco Use   Smoking status: Never   Smokeless tobacco: Never  Vaping Use   Vaping status: Never Used  Substance and Sexual Activity   Alcohol use: Yes    Comment: Occas   Drug use: Yes    Types: Marijuana   Sexual activity: Yes    Birth control/protection: None  Other Topics Concern   Not on file  Social History Narrative   Not on file   Social Drivers of Health   Tobacco Use: Low Risk (02/10/2024)   Patient History    Smoking Tobacco Use: Never    Smokeless Tobacco Use: Never    Passive Exposure: Not on file  Financial Resource Strain: High Risk (04/12/2023)   Overall Financial Resource Strain (CARDIA)    Difficulty of Paying Living Expenses: Hard  Food  Insecurity: Food Insecurity Present (04/12/2023)   Hunger Vital Sign    Worried About Running Out of Food in the Last Year: Sometimes true    Ran Out of Food in the Last Year: Sometimes true  Transportation Needs: No Transportation Needs (04/12/2023)   PRAPARE - Administrator, Civil Service (Medical): No    Lack of Transportation (Non-Medical): No  Physical Activity: Unknown (04/12/2023)   Exercise Vital Sign    Days of Exercise per Week: 0 days    Minutes of Exercise per Session: Not on file  Stress: Stress Concern Present (04/12/2023)   [employer] of Occupational Health - Occupational Stress Questionnaire    Feeling of Stress : To some extent  Social Connections: Moderately Integrated (04/12/2023)   Social Connection and Isolation Panel    Frequency of Communication with Friends and Family: More than three times a week    Frequency of Social Gatherings with Friends and Family: More than three times a week    Attends Religious Services: 1 to 4 times per year    Active Member of [employer] or Organizations: No    Attends Engineer, Structural: More than 4 times per year    Marital Status: Never married  Intimate  Partner Violence: Unknown (06/03/2021)   Received from [facility]   HITS    Physically Hurt: Not on file    Insult or Talk Down To: Not on file    Threaten Physical Harm: Not on file    Scream or Curse: Not on file  Depression (PHQ2-9): Low Risk (12/18/2023)   Depression (PHQ2-9)    PHQ-2 Score: 0  Alcohol Screen: Low Risk (04/12/2023)   Alcohol Screen    Last Alcohol Screening Score (AUDIT): 1  Housing: Unknown (04/12/2023)   Housing Stability Vital Sign    Unable to Pay for Housing in the Last Year: No    Number of Times Moved in the Last Year: Not on file    Homeless in the Last Year: No  Utilities: Not on file  Health Literacy: Not on file     Physical Exam   Vitals:   02/11/24 0000 02/11/24 0015  BP: (!) 192/122 (!) 207/124  Pulse: 88 88   Resp:    Temp:    SpO2: 99% 99%    CONSTITUTIONAL: Well-appearing, NAD NEURO/PSYCH:  Alert and oriented x 3, no focal deficits EYES:  eyes equal and reactive ENT/NECK:  no LAD, no JVD CARDIO: Regular rate, well-perfused, normal S1 and S2 PULM:  CTAB no wheezing or rhonchi GI/GU:  non-distended, non-tender MSK/SPINE:  No gross deformities, no edema SKIN:  no rash, atraumatic   *Additional and/or pertinent findings included in MDM below  Diagnostic and Interventional Summary    EKG Interpretation Date/Time:    Ventricular Rate:    PR Interval:    QRS Duration:    QT Interval:    QTC Calculation:   R Axis:      Text Interpretation:         Labs Reviewed - No data to display  No orders to display    Medications  hydrALAZINE  (APRESOLINE ) tablet 100 mg (has no administration in time range)  amoxicillin  (AMOXIL ) capsule 500 mg (500 mg Oral Given 02/11/24 0031)  acetaminophen  (TYLENOL ) tablet 1,000 mg (1,000 mg Oral Given 02/11/24 0031)     Procedures  /  Critical Care Procedures  ED Course and Medical Decision Making  Initial Impression and Ddx Question dental pain versus TMJ related pain.  His pain is not localized to the TMJ and on exam he has evidence of impacted wisdom teeth upper and lower on the right side.  Some evidence of cavities and decay to the mandibular wisdom tooth.  No signs of abscess to the gingiva or any other signs of more significant contiguous infection.  He has pain when he bites down.  Highly doubt that this pain is referred cardiac pain.  Seems well localized to the face or teeth.  Patient is found to be hypertensive on arrival up to 200/120.  Says that he did not have food today so he did not take his additional doses of blood pressure medicine which likely explains it.  Will provide his hydralazine  and ensure proper response.  Patient does have a history of CKD  Past medical/surgical history that increases complexity of ED encounter:  CKD  Interpretation of Diagnostics Laboratory and/or imaging options to aid in the diagnosis/care of the patient were considered.  After careful history and physical examination, it was determined that there was no indication for diagnostics at this time.  Patient Reassessment and Ultimate Disposition/Management     Blood pressure trending down, patient continues to look well with only mild facial pain at this time, no  other symptoms.  Discharged with return precautions.  Patient management required discussion with the following services or consulting groups:  None  Complexity of Problems Addressed Acute illness or injury that poses threat of life of bodily function  Additional Data Reviewed and Analyzed Further history obtained from: Prior labs/imaging results  Additional Factors Impacting ED Encounter Risk Prescriptions  Mike HERO. [place], MD [facility] Emergency Medicine [facility] [email or web address]  Final Clinical Impressions(s) / ED Diagnoses     ICD-10-CM   1. Facial pain  R51.9       ED Discharge Orders          Ordered    amoxicillin  (AMOXIL ) 500 MG capsule  3 times daily        02/11/24 0017    naproxen  (NAPROSYN ) 500 MG tablet  2 times daily,   Status:  Discontinued        02/11/24 0017    HYDROcodone -acetaminophen  (NORCO/VICODIN) 5-325 MG tablet  Every 4 hours PRN        02/11/24 0028             Discharge Instructions Discussed with and Provided to Patient:    Discharge Instructions      You were evaluated in the Emergency Department and after careful evaluation, we did not find any emergent condition requiring admission or further testing in the hospital.  Your exam/testing today is overall reassuring.  Your symptoms are most likely related to your wisdom teeth.  They are impacted and may need to be removed.  Recommend use of the amoxicillin  antibiotic as directed for the next week.  Use the tylenol  1000mg  every 6-8 hours as  needed for pain.  Can use the Norco tablets for more severe pain but use caution as this medication can cause drowsiness.  Follow-up with a dentist.  Please return to the Emergency Department if you experience any worsening of your condition.   Thank you for allowing us  to be a part of your care.      Bowen Mike HERO, MD 02/11/24 [phone]

## 2024-03-06 ENCOUNTER — Other Ambulatory Visit: Payer: Self-pay | Admitting: Internal Medicine

## 2024-03-06 ENCOUNTER — Telehealth: Payer: Self-pay | Admitting: Internal Medicine

## 2024-03-06 DIAGNOSIS — N184 Chronic kidney disease, stage 4 (severe): Secondary | ICD-10-CM

## 2024-03-06 NOTE — Telephone Encounter (Signed)
 Copied from CRM [id number]. Topic: Referral - Request for Referral >> Mar 06, 2024  3:11 PM Larissa RAMAN wrote: Did the patient discuss referral with their provider in the last year? Yes (If No - schedule appointment) (If Yes - send message)  Appointment offered? Yes  Type of order/referral and detailed reason for visit: nephrologist   Preference of office, provider, location: [facility]  If referral order, have you been seen by this specialty before? Yes  (If Yes, this issue or another issue? When? Where?  Can we respond through MyChart? Yes

## 2024-03-17 ENCOUNTER — Other Ambulatory Visit (HOSPITAL_COMMUNITY): Payer: Self-pay

## 2024-03-17 ENCOUNTER — Telehealth: Payer: Self-pay | Admitting: Pharmacy Technician

## 2024-03-17 NOTE — Telephone Encounter (Signed)
 Pharmacy Patient Advocate Encounter   Received notification from [facility] KEY that prior authorization for Trulicity  4.5mg  is required/requested.   Unable to verify pt's ins.    Archived KEY: [id number] for now. (Perform RX Medicaid)

## 2024-03-28 ENCOUNTER — Other Ambulatory Visit (HOSPITAL_COMMUNITY): Payer: Self-pay

## 2024-03-28 NOTE — Telephone Encounter (Signed)
 Insurance card located and updated. Prior authorization has been submitted.

## 2024-03-31 ENCOUNTER — Other Ambulatory Visit (HOSPITAL_COMMUNITY): Payer: Self-pay

## 2024-03-31 NOTE — Telephone Encounter (Signed)
 Pharmacy Patient Advocate Encounter  Received notification from [facility] MEDICAID that Prior Authorization for Trulicity  4.5mg  has been APPROVED from 03/28/24 to 03/28/25 filled 03/29/24   PA #/Case ID/Reference #: [id number]

## 2024-04-22 ENCOUNTER — Ambulatory Visit: Admitting: Internal Medicine
